# Patient Record
Sex: Female | Born: 1940 | Race: White | Hispanic: No | Smoking: Former smoker
Health system: Southern US, Community
[De-identification: ages and names within clinical notes are randomized; demographics above are authoritative.]

## PROBLEM LIST (undated history)

## (undated) DIAGNOSIS — M839 Adult osteomalacia, unspecified: Secondary | ICD-10-CM

## (undated) DIAGNOSIS — Z66 Do not resuscitate: Secondary | ICD-10-CM

## (undated) DIAGNOSIS — F329 Major depressive disorder, single episode, unspecified: Secondary | ICD-10-CM

## (undated) DIAGNOSIS — R7301 Impaired fasting glucose: Secondary | ICD-10-CM

## (undated) DIAGNOSIS — F32A Depression, unspecified: Secondary | ICD-10-CM

## (undated) DIAGNOSIS — K219 Gastro-esophageal reflux disease without esophagitis: Secondary | ICD-10-CM

## (undated) DIAGNOSIS — C3490 Malignant neoplasm of unspecified part of unspecified bronchus or lung: Secondary | ICD-10-CM

## (undated) DIAGNOSIS — J449 Chronic obstructive pulmonary disease, unspecified: Secondary | ICD-10-CM

## (undated) DIAGNOSIS — C719 Malignant neoplasm of brain, unspecified: Secondary | ICD-10-CM

## (undated) DIAGNOSIS — E7800 Pure hypercholesterolemia, unspecified: Secondary | ICD-10-CM

## (undated) DIAGNOSIS — I10 Essential (primary) hypertension: Secondary | ICD-10-CM

## (undated) DIAGNOSIS — M8580 Other specified disorders of bone density and structure, unspecified site: Secondary | ICD-10-CM

## (undated) DIAGNOSIS — R911 Solitary pulmonary nodule: Secondary | ICD-10-CM

## (undated) DIAGNOSIS — F17200 Nicotine dependence, unspecified, uncomplicated: Secondary | ICD-10-CM

## (undated) HISTORY — DX: Depression, unspecified: F32.A

## (undated) HISTORY — PX: RIGHT OOPHORECTOMY: SHX2359

## (undated) HISTORY — DX: Do not resuscitate: Z66

## (undated) HISTORY — DX: Solitary pulmonary nodule: R91.1

## (undated) HISTORY — DX: Pure hypercholesterolemia, unspecified: E78.00

## (undated) HISTORY — DX: Adult osteomalacia, unspecified: M83.9

## (undated) HISTORY — PX: CHOLECYSTECTOMY: SHX55

## (undated) HISTORY — DX: Nicotine dependence, unspecified, uncomplicated: F17.200

## (undated) HISTORY — PX: APPENDECTOMY: SHX54

## (undated) HISTORY — DX: Impaired fasting glucose: R73.01

## (undated) HISTORY — PX: CATARACT EXTRACTION W/ INTRAOCULAR LENS  IMPLANT, BILATERAL: SHX1307

## (undated) HISTORY — DX: Other specified disorders of bone density and structure, unspecified site: M85.80

## (undated) HISTORY — PX: BILATERAL CARPAL TUNNEL RELEASE: SHX6508

## (undated) HISTORY — DX: Essential (primary) hypertension: I10

## (undated) HISTORY — DX: Malignant neoplasm of brain, unspecified: C71.9

## (undated) HISTORY — DX: Major depressive disorder, single episode, unspecified: F32.9

## (undated) HISTORY — PX: BRAIN SURGERY: SHX531

---

## 1998-08-12 ENCOUNTER — Ambulatory Visit (HOSPITAL_BASED_OUTPATIENT_CLINIC_OR_DEPARTMENT_OTHER): Admission: RE | Admit: 1998-08-12 | Discharge: 1998-08-12 | Payer: Self-pay | Admitting: Orthopedic Surgery

## 1999-07-06 ENCOUNTER — Other Ambulatory Visit: Admission: RE | Admit: 1999-07-06 | Discharge: 1999-07-06 | Payer: Self-pay | Admitting: *Deleted

## 1999-10-08 ENCOUNTER — Encounter: Admission: RE | Admit: 1999-10-08 | Discharge: 1999-10-08 | Payer: Self-pay | Admitting: *Deleted

## 2000-09-12 ENCOUNTER — Other Ambulatory Visit: Admission: RE | Admit: 2000-09-12 | Discharge: 2000-09-12 | Payer: Self-pay | Admitting: *Deleted

## 2002-08-07 ENCOUNTER — Encounter: Admission: RE | Admit: 2002-08-07 | Discharge: 2002-08-07 | Payer: Self-pay | Admitting: *Deleted

## 2002-12-28 ENCOUNTER — Ambulatory Visit (HOSPITAL_COMMUNITY): Admission: RE | Admit: 2002-12-28 | Discharge: 2002-12-28 | Payer: Self-pay | Admitting: Gastroenterology

## 2006-03-04 ENCOUNTER — Other Ambulatory Visit: Admission: RE | Admit: 2006-03-04 | Discharge: 2006-03-04 | Payer: Self-pay | Admitting: Family Medicine

## 2013-03-06 ENCOUNTER — Ambulatory Visit
Admission: RE | Admit: 2013-03-06 | Discharge: 2013-03-06 | Disposition: A | Discharge status: Home / Self Care | Payer: Medicare Other | Source: Ambulatory Visit | Attending: Family Medicine | Admitting: Family Medicine

## 2013-03-06 ENCOUNTER — Other Ambulatory Visit: Payer: Self-pay | Admitting: Family Medicine

## 2013-03-06 DIAGNOSIS — R222 Localized swelling, mass and lump, trunk: Secondary | ICD-10-CM

## 2013-03-06 MED ORDER — IOHEXOL 300 MG/ML  SOLN
75.0000 mL | Freq: Once | INTRAMUSCULAR | Status: AC | PRN
  Administered 2013-03-06: 75 mL via INTRAVENOUS

## 2013-03-09 ENCOUNTER — Other Ambulatory Visit: Payer: Self-pay | Admitting: *Deleted

## 2013-03-09 ENCOUNTER — Institutional Professional Consult (permissible substitution) (INDEPENDENT_AMBULATORY_CARE_PROVIDER_SITE_OTHER): Payer: Medicare Other | Admitting: Cardiothoracic Surgery

## 2013-03-09 ENCOUNTER — Other Ambulatory Visit: Payer: Self-pay

## 2013-03-09 ENCOUNTER — Encounter: Payer: Self-pay | Admitting: *Deleted

## 2013-03-09 VITALS — BP 95/54 | HR 94 | Resp 16 | Ht 61.0 in | Wt 105.0 lb

## 2013-03-09 DIAGNOSIS — R7301 Impaired fasting glucose: Secondary | ICD-10-CM | POA: Insufficient documentation

## 2013-03-09 DIAGNOSIS — I10 Essential (primary) hypertension: Secondary | ICD-10-CM | POA: Insufficient documentation

## 2013-03-09 DIAGNOSIS — C3432 Malignant neoplasm of lower lobe, left bronchus or lung: Secondary | ICD-10-CM | POA: Insufficient documentation

## 2013-03-09 DIAGNOSIS — E7800 Pure hypercholesterolemia, unspecified: Secondary | ICD-10-CM | POA: Insufficient documentation

## 2013-03-09 DIAGNOSIS — R918 Other nonspecific abnormal finding of lung field: Secondary | ICD-10-CM

## 2013-03-09 DIAGNOSIS — M839 Adult osteomalacia, unspecified: Secondary | ICD-10-CM | POA: Insufficient documentation

## 2013-03-09 DIAGNOSIS — R51 Headache: Secondary | ICD-10-CM

## 2013-03-09 DIAGNOSIS — R222 Localized swelling, mass and lump, trunk: Secondary | ICD-10-CM

## 2013-03-09 DIAGNOSIS — M8580 Other specified disorders of bone density and structure, unspecified site: Secondary | ICD-10-CM | POA: Insufficient documentation

## 2013-03-09 NOTE — Progress Notes (Signed)
[address]            [place] [address]          [phone]      Chelsea Cruz [facility] Medical Record #[id number] Date of Birth: 10/30/1940  Referring: Gweneth Dimitri, MD Primary Care: Gweneth Dimitri, MD  Chief Complaint:   Lung Mass  History of Present Illness:    Patient is a 72 year old female with a 40 year history of smoking. Who presented earlier this week to her primary care physician for yearly checkup. While she was there mentioned recent weight loss and shortness of breath with exertion. The chest x-ray was obtained which revealed a large left lung mass. The patient denies hemoptysis, does have some cough, denies wheezing. She has not had any chest pain or bone pain.  Patient was referred to thoracic surgery to evaluate and consider treatment options for a large left lung mass.   Current Activity/ Functional Status:  Patient is independent with mobility/ambulation, transfers, ADL's, IADL's.  Zubrod Score: At the time of surgery this patient's most appropriate activity status/level should be described as: []  Normal activity, no symptoms [x]  Symptoms, fully ambulatory []  Symptoms, in bed less than or equal to 50% of the time []  Symptoms, in bed greater than 50% of the time but less than 100% []  Bedridden []  Moribund   Past Medical History  Diagnosis Date  . Nodule of left lung   . Hypertension   . Nicotine addiction   . Osteopenia   . Osteomalacia   . Impaired fasting glucose   . Hypercholesteremia   . Depression     Past Surgical History  Procedure Laterality Date  . Cholecystectomy    . Right oophorectomy      1969    Family History  Problem Relation Age of Onset  . Heart disease Mother   . Coronary artery disease Brother   . Alzheimer's disease Brother   . Hypertension Brother     #2  . Stroke Brother     #2  . Brain cancer Brother     #3    History   Social History  . Marital Status: Widowed   Spouse Name: N/A    Number of Children: N/A  . Years of Education: N/A   Occupational History  . retired    Social History Main Topics  . Smoking status: Current Every Day Smoker    Types: Cigarettes  . Smokeless tobacco: Not on file  . Alcohol Use: No  . Drug Use: No  . Sexually Active: Not on file   Other Topics Concern  . Not on file   Social History Narrative  . No narrative on file    History  Smoking status  . Current Every Day Smoker  . Types: Cigarettes  Smokeless tobacco  . no    History  Alcohol Use No     Allergies  Allergen Reactions  . Penicillins     Current Outpatient Prescriptions  Medication Sig Dispense Refill  . alendronate (FOSAMAX) 70 MG tablet 70 mg every 7 (seven) days.       Marland Kitchen amLODipine (NORVASC) 2.5 MG tablet 2.5 mg daily.       . cholecalciferol (VITAMIN D) 1000 UNITS tablet Take 1,000 Units by mouth daily. Takes 3 capsules      . COMBIVENT RESPIMAT 20-100 MCG/ACT AERS respimat 1 puff every 6 (  six) hours as needed.       Marland Kitchen lisinopril (PRINIVIL,ZESTRIL) 10 MG tablet 10 mg.       . moxifloxacin (AVELOX) 400 MG tablet 400 mg daily.       . ondansetron (ZOFRAN) 4 MG tablet Take 4 mg by mouth every 8 (eight) hours as needed for nausea.       No current facility-administered medications for this visit.       Review of Systems:     Cardiac Review of Systems: Y or N  Chest Pain [  n  ]  Resting SOB [ n  ] Exertional SOB  [ y ]  Orthopnea [ n ]   Pedal Edema [n   ]    Palpitations [ n ] Syncope  [ n ]   Presyncope [  n ]  General Review of Systems: [Y] = yes [  ]=no Constitional: recent weight change [ y 9 lbs year ]; anorexia [ y ]; fatigue [ y ]; nausea [n  ]; night sweats [ y ]; fever [ n ]; or chills [ n ];                                                                                                                                          Dental: poor dentition[ n ]; Last Dentist visit:   Eye : blurred vision [n  ]; diplopia [  n  ]; vision changes [ n ];  Amaurosis fugax[ n ]; Resp: cough Cove.Etienne  ];  wheezing[n  ];  hemoptysis[n  ]; shortness of breath[ y ]; paroxysmal nocturnal dyspnea[n  ]; dyspnea on exertion[y  ]; or orthopnea[  ];  GI:  gallstones[  ], vomiting[n  ];  dysphagia[  ]; melena[  n];  hematochezia [  ]; heartburn[  ];   Hx of  Colonoscopy[ y ]; GU: kidney stones [  ]; hematuria[  ];   dysuria [  ];  nocturia[  ];  history of     obstruction [n  ]; urinary frequency [  ]             Skin: rash, swelling[  ];, hair loss[  ];  peripheral edema[  ];  or itching[  ]; Musculosketetal: myalgias[  ];  joint swelling[  ];  joint erythema[  ];  joint pain[  ];  back pain[  ];  Heme/Lymph: bruising[  ];  bleeding[  ];  anemia[  ];  Neuro: TIA[  ];  headaches[  ];  stroke[ n ];  vertigo[n  ];  seizures[n  ];   paresthesias[ n ];  difficulty walking[ n ];  Psych:depression[  ]; anxiety[  ];  Endocrine: diabetes[  ];  thyroid dysfunction[  ];  Immunizations: Flu Cove.Etienne  ]; Pneumococcal[ y ];  Other:  Physical Exam: BP 95/54  Pulse 94  Resp 16  Ht  5\' 1"  (1.549 m)  Wt 105 lb (47.628 kg)  BMI 19.85 kg/m2  SpO2 95%  General appearance: alert, cooperative, appears stated age, no distress and pale Neurologic: intact Heart: regular rate and rhythm, S1, S2 normal, no murmur, click, rub or gallop and normal apical impulse Lungs: clear to auscultation bilaterally and normal percussion bilaterally Abdomen: soft, non-tender; bowel sounds normal; no masses,  no organomegaly Extremities: extremities normal, atraumatic, no cyanosis or edema and Homans sign is negative, no sign of DVT Patient has no carotid bruits, no cervical or supraclavicular adenopathy no axillary adenopathy no inguinal adenopathy, just palpable DP and PT pulses bilaterally, she has no clubbing of the nails   Diagnostic Studies & Laboratory data:     Recent Radiology Findings:  Ct Chest W Contrast  03/06/2013   *RADIOLOGY REPORT*  Clinical Data: Abnormal  chest x-ray, smoking history, cough  CT CHEST WITH CONTRAST  Technique:  Multidetector CT imaging of the chest was performed following the standard protocol during bolus administration of intravenous contrast.  Contrast:  75 ml Omnipaque-300  Comparison: Chest x-ray of 03/06/2013  Findings: At the site of the opacity on chest x-ray, there is a large soft tissue mass occupying much of the mid left upper lobe extending anteriorly.  This mass measures 6.9 x 9.4 x 5.9 cm and encircles the bronchus to the left upper lobe, consistent with primary lung carcinoma.  Coarse markings adjacent to this mass posteriorly and inferiorly may represent lymphangitic spread. Diffuse changes of centrilobular emphysema are noted.  No right lung involvement is noted.  No pleural effusion is seen.  On soft tissue window images, the thyroid gland is unremarkable. The thoracic aorta opacifies and the origins of the great vessels are patent.  The pulmonary arteries also are unremarkable.  On the soft tissue window images the mass in the left upper lobe appears to contain multiple areas of necrosis.  There are small mediastinal lymph nodes present none of which appear pathologically enlarged currently.  No evidence of liver metastatic involvement is seen on the few images obtained although there is some prominence of central intrahepatic ducts.  No bony abnormality is noted.  IMPRESSION:  1.  Large soft tissue mass occupies much of the mid and anterior left upper lobe consistent with primary lung carcinoma, encircling the left upper lobe bronchus. 2.  Small mediastinal lymph nodes, none of which are pathologically enlarged. 3.  Diffuse centrilobular emphysema. 4.  Slight prominence of central intrahepatic ducts of questionable significance on the few images through the upper abdomen obtained.   Original Report Authenticated By: Dwyane Dee, M.D.    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 The patient her daughter and granddaughter had their questions answered.   Chelsea Cruz  Bari Clema Skousen MD  Beeper [phone] Office [phone] 03/09/2013 2:09 PM

## 2013-03-09 NOTE — Patient Instructions (Signed)
Lung Cancer  Lung cancer is a tumor which starts as a growth in your lungs. Cancer is a group of many related diseases that begin in cells, the building blocks of the body. Normally, cells grow and divide to produce more cells only when the body needs them. Sometimes cells keep dividing when new cells are not needed. These extra cells may form a mass of tissue called a growth or tumor. Tumors can be either benign (not cancerous) or malignant (cancerous). Cancer can begin in any organ or tissue of the body. The original tumor (where the tumor started out) is called the primary cancer and is usually named for where it begins.   Lung cancer is the most common cause of cancer death in men and women. There are several different types of lung cancers. Usually, lung cancer is described as either small-cell lung cancer or non-small-cell lung cancer. Other types of cancer occur in the lungs, including carcinoid and cancers spread from other organs. The types of cancer have different behavior and treatment.  CAUSES   This cancer usually starts when the lungs are exposed to harmful chemicals. When you quit smoking, your risk of lung cancer falls each year (but is never the same as a person who has never smoked).   Other risks include:    Radon gas exposure.   Asbestos and other industrial substance exposure.   Second hand tobacco smoke.   Air pollution.   Family or personal history of lung cancer.   Age over 65.  SYMPTOMS   Lung cancer can cause many symptoms. They depend on the type of cancer, its location and other factors.  Symptoms of lung cancer can include:   Cough (either new, different or more severe).   Shortness of breath.   Coughing up blood (hemoptysis).   Chest pain.   Hoarseness.   Swelling of the face.   Drooping eyelid.   Changes in blood tests: low sodium (hyponatremia), high calcium (hypercalcemia) or low blood count (anemia).   Weight loss.  In its early stages, lung cancer may not have  symptoms and can be discovered by accident. Many of the symptoms above can be caused by diseases other than lung cancer.  DIAGNOSIS   In early lung cancer, the patient often does not notice problems. It usually has spread by the time problems are first noticed. Your caregiver may suspect lung cancer based on your symptoms, your exam or based on tests (such as x-rays) obtained for other reasons. Common tests that help your caregiver diagnose your condition include:   Chest x-ray.   CT scan of the lungs and chest.   Blood tests.  If a tumor is found, a biopsy will be necessary to confirm that cancer is present and to determine the type of cancer.  TREATMENT    Surgery offers a hope for a cure if the cancer has not spread and the cancer is not a small cell (oat cell) cancer of the lung. Surgery cannot cure the small cell type of cancer.   Radiation Therapy is a form of high energy X-ray that helps slow or kill the cancer. It is often used along with medications (chemotherapy) to help treat the cancer and control pain.   Chemotherapy is used in combination with surgery in advanced cancer. It is also used in all small cell cancers.   Many new treatments look promising.   Your caregiver can give you more information and discuss treatment options that are best for   see a cancer specialist, if that has not been arranged.  If you require oxygen or breathing equipment, be sure you know how to use it and who to call with questions.  Follow any special diet directions. If you have problems with appetite, ask your caregiver for help. SEEK MEDICAL CARE IF:   You have had a surgical procedure are you are having trouble recovering.  You have ongoing weight loss.  You have decreased strength or energy past the  point when your caregiver said you would feel better.  You develop nausea or lightheadedness.  You have pain that is not improving. SEEK IMMEDIATE MEDICAL CARE IF:   You cough up clotted blood or bright red blood.  Your pain is uncontrolled.  You develop new difficulty breathing or chest pain.  You develop swelling in one or both ankles or legs, or swelling in your face or neck.  You develop new headache or confusion. Document Released: 01/10/2001 Document Revised: 12/27/2011 Document Reviewed: 10/21/2008 [name] Patient Information 2014 [place], [place].  Lung Resection A lung resection is surgery to remove a lung. When an entire lung is removed, the procedure is called a pneumonectomy. When only part of a lung is removed, the procedure is called a lobectomy. A lung resection is typically done to get rid of a tumor or cancer. This surgery can help relieve some or all of your symptoms. The surgery can also help keep the problem from getting worse. It may provide the best chance for curing your disease. However, surgery may not necessarily cure lung cancer, if that is the problem. Most people need to stay in the hospital for several days after this procedure.  LET YOUR CAREGIVER KNOW ABOUT:  Allergies to food or medicine.  Medicines taken, including vitamins, herbs, eyedrops, over-the-counter medicines, and creams.  Use of steroids (by mouth or creams).  Previous problems with anesthetics or numbing medicines.  History of bleeding problems or blood clots.  Previous surgery.  Other health problems, including diabetes and kidney problems.  Possibility of pregnancy, if this applies. RISKS AND COMPLICATIONS  Lung resections have been done for many years with good results and few complications. However, all surgery is associated with possible risks. Some of these risks are:  Excessive bleeding.  Infection.  Inability to breath without a ventilator.  Persistent shortness of  breath.  Heart problems, including abnormal rhythms and a risk of heart attack or heart failure.  Blood clots.  Injury to a blood vessel.  Injury to a nerve.  Failure to heal properly.  Stroke.  Bronchopleural fistula. This is a small hole between one of the main breathing tubes and the lining of the lungs. BEFORE THE PROCEDURE  In order to prepare for surgery, your caregiver may ask for several tests to be done. These may include:  Blood tests.  Urine tests.  X-rays.  Imaging tests, such as CT scans, MRI scans, and PET scans. These tests are done to find the exact size and location of the tumor that will be removed.  Pulmonary function tests (PFTs). These are breathing tests to assess the function of your lungs before surgery and to decide how to best help your breathing after surgery.  Heart testing. This is done to make sure your heart is strong enough for the procedure.  Bronchoscopy. This is a technique that allows your caregiver to look at the inside of your airways. This is done using a soft, flexible tube (bronchoscope). Along with imaging tests, this can help  your caregiver know the exact location and size of the area that will be removed during surgery.  Lymph node sampling. This may need to be done to see if the tumor has spread. It may be done as a separate surgery or right before your lung resection procedure. PROCEDURE  An intravenous line (IV) will be placed in your arm. You will be given medicine that makes you sleep (general anesthetic).  Once you are asleep, a breathing tube is placed into your windpipe. You may also get pain medicine through a thin, flexible tube (catheter) in your back. The catheter is put through your skin and next to your spinal cord, where it releases anesthetic medicine.  Next, you will be turned onto your side. This makes it easier for your surgeon to reach the area of your ribcage where the surgical cut (incision) will be made. This  area is washed with a disinfectant solution and might also be shaved. A catheter will be put into your bladder to collect urine. Another tube will be carefully passed through your throat and into your stomach.  The surgeon will make an incision on your side, which will start between two of your ribs and go around to your back. Your ribs will be spread and held open. Part of one rib may be removed to make it easier for the surgeon to reach your lung.  Your surgeon will carefully cut the veins, arteries, and bronchus leading to the lung. After being cut, each of these pieces will be sewn or stapled closed. Then, the lung or part of the lung will be removed.  Your surgeon will check inside your chest to make sure there is no bleeding in or around the lungs. Lymph nodes near the lung may also be removed for later tests. This is done to check if your problems have spread to the lymph nodes.  Depending on your situation, your surgeon may put tubes into your chest to drain extra fluid and air from the chest cavity after surgery. After the tubes are in, your ribcage will be closed with stitches. The stitches help your ribcage heal and keep it from moving. After this, the layers of tissue under the skin are closed with more stitches, which will dissolve inside your body over time. Finally, your skin is closed with stitches or staples and covered with a bandage. AFTER THE PROCEDURE   After surgery, you will be taken to the recovery area where a nurse will monitor your progress. You may still have a breathing tube, spinal catheter, bladder catheter, stomach tube, and possibly chest tubes inside your body. These will be removed during your recovery. You may be put on a respirator following surgery if some assistance is needed to help your breathing. When you are awake, stable, and without complications, you will likely continue recovery in the intensive care unit (ICU).  As you wake up, you might feel some aches  and pains in your chest and throat. Sometimes during recovery, patients may shiver or feel nauseous. Both of these symptoms are temporary and may be caused by the anesthesia. Your caregivers can give you medicine to help these problems go away.  The breathing tube will be taken out as soon as your caregivers feel you can breathe on your own. For most people, this happens on the same day as the surgery.  If your surgery and time in the ICU go well, most of the tubes and equipment will be taken out within the first  1 to 2 days after surgery. This is about how long most people stay in the ICU. You may need to stay longer, depending on how you are doing.  You should also start respiratory therapy in the ICU. This therapy uses breathing exercises to help your other lung stay healthy and get stronger.  As you improve, you will be moved to a regular hospital room for continued respiratory therapy, help with your bladder and bowels, and to continue medicines. Most people stay in the hospital for 5 to 7 days. However, your stay may be longer, depending on how your surgery went and how well you are doing.  After your lung or part of your lung is taken out, there will be a space inside your chest. This space will often fill up with fluid over time. The amount of time this takes is different for each person. Because your chest needs to fill with fluid, your surgeon may or may not put a drainage tube in your chest. If there is a chest tube, it will most likely be removed within 24 hours after the surgery.  You will receive care until you are doing well and your caregiver feels it is safe for you to go home or to transfer to an extended care facility. Document Released: 12/25/2002 Document Revised: 12/27/2011 Document Reviewed: 06/03/2011 [facility] Patient Information 2014 [place], [place]. Positron Emission Tomography (PET Scan) PET stands for positron emission tomography. This is a test similar to an X-ray.  Pictures can be taken of a body part after injection of a very small dose of a chemical called a radionuclide. This is combined with sugar, water, or ammonia to give off tiny particles called positrons. The positrons emitted are like small bursts of energy that can be detected by a scanner. They are processed by a computer to create images. These images can be used to study different diseases. They are often used to study cancer and cancer therapy. A scan of the entire body can be done and used to study all its parts. Because this test is tagged to a sugar used by cells, the bursts of energy show up differently in cells that use sugar faster. The computer is able to produce a color-coded picture based on this. The colors and amount of brightness on a PET image show different levels of tissue or organ function. For example, a cancer grows faster than healthy tissue and uses more sugar than normal tissue. It will absorb more of the substance injected. This causes it to appear brighter than normal tissue on the PET image. A specialist will read and explain the images. Other examinations, such as recent CT (or CAT) scans or MRI scans may help with interpretation and should be brought along. There are usually no restrictions after the test. You should drink plenty of fluids to flush the radioactive substance from your body. BEFORE THE PROCEDURE   PET is usually an outpatient procedure. Wear comfortable, loose-fitting clothes.  Do not eat for four hours before the scan. You will be encouraged to drink water.  Your caregiver will instruct you regarding the use of medications before the test.  Note: Diabetic patients should ask for any specific diet guidelines to control glucose (sugar) levels during the day of the test. There are limitations with the test if your blood sugar is not controlled during or before the test.  Be on time because of the rapid decay of the radioactive material that must be  injected. PROCEDURE  Before  the procedure begins a small amount of harmless radioactive material will be injected into a vein. This means you will have a needle stick. It will take from 30 minutes to one hour for the material to travel around your body in preparation for the scan. You will lie on a cushioned table and be moved through the center of a machine that looks like a large doughnut. This is the machine that detects the positrons. It is connected to a computer that produces images that can be viewed on a monitor. This will take about 30 minutes to an hour, during which you must remain still. Let your caregiver know if this will be difficult for you. Also, let your caregiver know if you need a sedative or help dealing with claustrophobia (feeling uncomfortable in enclosed spaces). HOME CARE INSTRUCTIONS   For the protection of your privacy, test results can not be given over the phone. Make sure you receive the results of your test. Ask as to how these results are to be obtained if you have not been informed. It is your responsibility to obtain your test results.  Drink several 8-once glasses of water following the test to flush the small amount of radioactive material out of your body.  Keep your follow-up appointments. Document Released: 04/10/2003 Document Revised: 12/27/2011 Document Reviewed: 10/04/2005 [facility] Patient Information 2014 [place], [place].  Pulmonary Function Tests Your caregiver has scheduled you for pulmonary function testing. Pulmonary Function Tests (PFTs) are tests which help [place] know how your lungs are working. The lungs are the large organs in your chest on both sides of the heart which allow you to breathe. The main job of the lungs is ventilation. Ventilation is moving air in and out of the lungs. The air breathed in contains oxygen which allows you to live. When you breathe out, your lungs get rid of carbon dioxide, a waste product of breathing. The medical term for  breathing in is inhalation. Breathing out is called exhalation. Some medical conditions interfere with breathing. This may be sudden and short lived as with pneumonia, or may be long standing such as with COPD (chronic obstructive pulmonary disease) that which may come as a result of years of smoking. CONDITIONS THAT INTERFERE WITH NORMAL BREATHING ARE CALLED RESTRICTIVE OR OBSTRUCTIVE  An obstructive condition occurs when air has difficulty flowing into the lungs due to resistance. This causes a decreased flow of air in the lungs. A restrictive condition occurs when the chest muscles are unable to expand adequately. This also causes a decreased flow of air in the lungs. USES OF PULMONARY FUNCTION TESTS Lung (pulmonary) function studies are used to find out causes of lung problems and what will be the best treatment. The "PFT" terms listed below refer to different procedures that measure lung function in different ways. Pulmonary function testing is quick, simple and safe for most people. There are many different reasons why PFTs may be ordered.   For healthy individuals as part of a routine physical examination  In industrial plants to follow how your lungs are working when exposed to chemicals over a long period of time  When an illness involving the lungs is suspected.  PFTs may be used to assess the lung function of patients prior to surgery or other procedures in patients who have current lung and/or heart problems.  The test is also used for people who are smokers or who have other conditions that might be affected by surgery or other procedures. Some common  measurements or values (terms) you may hear your caregiver use are:  Tidal volume (TV) - amount of air breathed in or out during normal breathing.  Minute volume (MV) - total amount of air breathed in and out per minute.  Vital capacity (VC) - total volume of air that can be breathed out after the largest breath in you can  take.  Functional residual capacity (FRC) - amount of air remaining in lungs after normal breathing out.  Total lung capacity - total amount of air in the lungs with the largest breath you can take.  Forced vital capacity (FVC) - the amount of air forced out quickly after taking the largest possible breath.  Forced expiratory volume (FEV) - volume of air breathed out during the first, second, and third seconds of the FVC test.  Forced expiratory flow (FEF) - average rate of flow during the middle half of the FVC test.  Peak expiratory flow rate (PEFR) - maximum amount of air during forced breathing out. The values of these tests vary from person to person. Your values are compared to other people who have had the test and are similar to you in age, size, etc. They can also be compared to previous tests following treatment of lung disease. The tests can determine if lung function is getting better and if the treatments used are successful. COMPLICATIONS OF TESTING MAY INCLUDE:  Light-headedness due to over breathing (hyperventilation).  An asthmatic attack from deep breathing. SOME REASONS FOR NOT DOING THE TEST INCLUDE:  Recent eye surgery, because of increased pressure inside the eyes during the procedure.  Recent abdominal or chest surgery, because of inability to take deep breaths.  Chest pain or heart problems.  Tuberculosis or respiratory infections, such as pneumonia, a cold, or the flu. Discuss concerns with your caregiver before your procedure. PREPARATION FOR TEST   Avoid eating a large meal before your test.  Do not smoke before your test.  Take medications as ordered by your caregiver.  Wear comfortable clothing which will not interfere with breathing. If done as an outpatient, you should be present 60 minutes prior to your procedure or as directed.  DURING THE TEST  You will be given a soft nose clip to wear during the procedure so that all of your breaths will go  through your mouth instead of your nose.  You will be given a sterile (germ-free) mouthpiece that will be attached to the spirometer. The spirometer is the machine that measures your breathing.  You will be instructed to perform various breathing maneuvers. The maneuvers will be done by inhaling (breathing in) and exhaling (breathing out). Depending on what measurements are ordered, you may be asked to repeat the maneuvers several times before the test is completed.  You may be given a bronchodilator after testing has been performed. A bronchodilator is a medication which makes the small air passages in your lungs larger. These medications usually make it easier to breathe. The tests are then repeated several minutes later after the bronchodilator has taken effect.  You will be monitored carefully during the procedure for faintness, dizziness, difficulty breathing, or any other problems. AFTER THE PROCEDURE   You may resume your usual diet, medications, and activities unless your physician advises you otherwise.  Your caregiver will go over your test results with you and determine what is causing your lung problems and what treatments may be helpful. Document Released: 05/27/2004 Document Revised: 12/27/2011 Document Reviewed: 10/02/2008 [employer] Patient Information 2014 [place].

## 2013-03-16 ENCOUNTER — Ambulatory Visit (HOSPITAL_COMMUNITY)
Admission: RE | Admit: 2013-03-16 | Discharge: 2013-03-16 | Disposition: A | Discharge status: Home / Self Care | Payer: Medicare Other | Source: Ambulatory Visit | Attending: Cardiothoracic Surgery | Admitting: Cardiothoracic Surgery

## 2013-03-16 ENCOUNTER — Encounter (HOSPITAL_COMMUNITY)
Admission: RE | Admit: 2013-03-16 | Discharge: 2013-03-16 | Disposition: A | Discharge status: Home / Self Care | Payer: Medicare Other | Source: Ambulatory Visit | Attending: Cardiothoracic Surgery | Admitting: Cardiothoracic Surgery

## 2013-03-16 DIAGNOSIS — G319 Degenerative disease of nervous system, unspecified: Secondary | ICD-10-CM | POA: Insufficient documentation

## 2013-03-16 DIAGNOSIS — R05 Cough: Secondary | ICD-10-CM | POA: Insufficient documentation

## 2013-03-16 DIAGNOSIS — R918 Other nonspecific abnormal finding of lung field: Secondary | ICD-10-CM

## 2013-03-16 DIAGNOSIS — R51 Headache: Secondary | ICD-10-CM

## 2013-03-16 DIAGNOSIS — J988 Other specified respiratory disorders: Secondary | ICD-10-CM | POA: Insufficient documentation

## 2013-03-16 DIAGNOSIS — R222 Localized swelling, mass and lump, trunk: Secondary | ICD-10-CM | POA: Insufficient documentation

## 2013-03-16 DIAGNOSIS — R0609 Other forms of dyspnea: Secondary | ICD-10-CM | POA: Insufficient documentation

## 2013-03-16 DIAGNOSIS — R059 Cough, unspecified: Secondary | ICD-10-CM | POA: Insufficient documentation

## 2013-03-16 DIAGNOSIS — R0989 Other specified symptoms and signs involving the circulatory and respiratory systems: Secondary | ICD-10-CM | POA: Insufficient documentation

## 2013-03-16 LAB — PULMONARY FUNCTION TEST

## 2013-03-16 LAB — CREATININE, SERUM
Creatinine, Ser: 0.79 mg/dL (ref 0.50–1.10)
GFR calc Af Amer: >90 mL/min (ref >90)
GFR calc non Af Amer: 81 mL/min — ABNORMAL LOW (ref >90)

## 2013-03-16 LAB — GLUCOSE, CAPILLARY: Glucose-Capillary: 112 mg/dL — ABNORMAL HIGH (ref 70–99)

## 2013-03-16 MED ORDER — GADOBENATE DIMEGLUMINE 529 MG/ML IV SOLN
10.0000 mL | Freq: Once | INTRAVENOUS | Status: AC | PRN
  Administered 2013-03-16: 10 mL via INTRAVENOUS

## 2013-03-16 MED ORDER — FLUDEOXYGLUCOSE F - 18 (FDG) INJECTION
18.4000 | Freq: Once | INTRAVENOUS | Status: AC | PRN
  Administered 2013-03-16: 18.4 via INTRAVENOUS

## 2013-03-16 MED ORDER — ALBUTEROL SULFATE (5 MG/ML) 0.5% IN NEBU
2.5000 mg | INHALATION_SOLUTION | Freq: Once | RESPIRATORY_TRACT | Status: AC
  Administered 2013-03-16: 2.5 mg via RESPIRATORY_TRACT

## 2013-03-22 ENCOUNTER — Ambulatory Visit (INDEPENDENT_AMBULATORY_CARE_PROVIDER_SITE_OTHER): Payer: Medicare Other | Admitting: Cardiothoracic Surgery

## 2013-03-22 ENCOUNTER — Encounter: Payer: Self-pay | Admitting: Cardiothoracic Surgery

## 2013-03-22 ENCOUNTER — Other Ambulatory Visit: Payer: Self-pay

## 2013-03-22 VITALS — BP 107/81 | Ht 61.0 in | Wt 104.0 lb

## 2013-03-22 DIAGNOSIS — R222 Localized swelling, mass and lump, trunk: Secondary | ICD-10-CM

## 2013-03-22 DIAGNOSIS — R918 Other nonspecific abnormal finding of lung field: Secondary | ICD-10-CM

## 2013-03-22 DIAGNOSIS — D381 Neoplasm of uncertain behavior of trachea, bronchus and lung: Secondary | ICD-10-CM

## 2013-03-22 NOTE — Progress Notes (Signed)
[address]       [place] [address]             [phone]                      Chelsea Cruz [facility] Medical Record #[id number] Date of Birth: Nov 25, 1940  Referring: Gweneth Dimitri, MD Primary Care: Gweneth Dimitri, MD  Chief Complaint:   Lung Mass  History of Present Illness:    Patient is a 72 year old female with a 40 year history of smoking. Who presented two  Week ago  to her primary care physician for yearly checkup. While she was there mentioned recent weight loss and shortness of breath with exertion. The chest x-ray was obtained which revealed a large left lung mass. The patient denies hemoptysis, does have some cough, denies wheezing. She has not had any chest pain or bone pain.  Patient was referred to thoracic surgery to evaluate and consider treatment options for a large left lung mass.  She returns today after PFTs done and PET scan and MRI of the brain have been completed   Current Activity/ Functional Status:  Patient is independent with mobility/ambulation, transfers, ADL's, IADL's.  Zubrod Score: At the time of surgery this patient's most appropriate activity status/level should be described as: []  Normal activity, no symptoms [x]  Symptoms, fully ambulatory []  Symptoms, in bed less than or equal to 50% of the time []  Symptoms, in bed greater than 50% of the time but less than 100% []  Bedridden []  Moribund   Past Medical History  Diagnosis Date  . Nodule of left lung   . Hypertension   . Nicotine addiction   . Osteopenia   . Osteomalacia   . Impaired fasting glucose   . Hypercholesteremia   . Depression     Past Surgical History  Procedure Laterality Date  . Cholecystectomy    . Right oophorectomy      1969    Family History  Problem Relation Age of Onset  . Heart disease Mother   . Coronary artery disease Brother   . Alzheimer's disease Brother   . Hypertension Brother     #2  . Stroke Brother     #2  .  Brain cancer Brother     #3    History   Social History  . Marital Status: Widowed    Spouse Name: N/A    Number of Children: N/A  . Years of Education: N/A   Occupational History  . retired    Social History Main Topics  . Smoking status: Current Every Day Smoker    Types: Cigarettes  . Smokeless tobacco: Not on file  . Alcohol Use: No  . Drug Use: No  . Sexually Active: Not on file   Other Topics Concern  . Not on file   Social History Narrative  . No narrative on file    History  Smoking status  . Current Every Day Smoker  . Types: Cigarettes  Smokeless tobacco  . no    History  Alcohol Use No     Allergies  Allergen Reactions  . Penicillins     Current Outpatient Prescriptions  Medication Sig Dispense Refill  . alendronate (FOSAMAX) 70 MG tablet 70 mg every 7 (seven) days.       . cholecalciferol (VITAMIN D) 1000 UNITS tablet Take 1,000 Units by mouth daily. Takes 3 capsules      . lisinopril (PRINIVIL,ZESTRIL)  10 MG tablet 10 mg.       . amLODipine (NORVASC) 2.5 MG tablet 2.5 mg daily.       . COMBIVENT RESPIMAT 20-100 MCG/ACT AERS respimat 1 puff every 6 (six) hours as needed.        No current facility-administered medications for this visit.       Review of Systems:     Cardiac Review of Systems: Y or N  Chest Pain [  n  ]  Resting SOB [ n  ] Exertional SOB  [ y ]  Orthopnea [ n ]   Pedal Edema [n   ]    Palpitations [ n ] Syncope  [ n ]   Presyncope [  n ]  General Review of Systems: [Y] = yes [  ]=no Constitional: recent weight change [ y 9 lbs year ]; anorexia [ y ]; fatigue [ y ]; nausea [n  ]; night sweats [ y ]; fever [ n ]; or chills [ n ];                                                                                                                                          Dental: poor dentition[ n ]; Last Dentist visit:   Eye : blurred vision [n  ]; diplopia [  n ]; vision changes [ n ];  Amaurosis fugax[ n ]; Resp: cough Cove.Etienne  ];   wheezing[n  ];  hemoptysis[n  ]; shortness of breath[ y ]; paroxysmal nocturnal dyspnea[n  ]; dyspnea on exertion[y  ]; or orthopnea[  ];  GI:  gallstones[  ], vomiting[n  ];  dysphagia[  ]; melena[  n];  hematochezia [  ]; heartburn[  ];   Hx of  Colonoscopy[ y ]; GU: kidney stones [  ]; hematuria[  ];   dysuria [  ];  nocturia[  ];  history of     obstruction [n  ]; urinary frequency [  ]             Skin: rash, swelling[  ];, hair loss[  ];  peripheral edema[  ];  or itching[  ]; Musculosketetal: myalgias[  ];  joint swelling[  ];  joint erythema[  ];  joint pain[  ];  back pain[  ];  Heme/Lymph: bruising[  ];  bleeding[  ];  anemia[  ];  Neuro: TIA[  ];  headaches[  ];  stroke[ n ];  vertigo[n  ];  seizures[n  ];   paresthesias[ n ];  difficulty walking[ n ];  Psych:depression[  ]; anxiety[  ];  Endocrine: diabetes[  ];  thyroid dysfunction[  ];  Immunizations: Flu Cove.Etienne  ]; Pneumococcal[ y ];  Other:  Physical Exam: BP 107/81  Ht 5\' 1"  (1.549 m)  Wt 104 lb (47.174 kg)  BMI 19.66 kg/m2  SpO2 96%  General appearance: alert, cooperative,  appears stated age, no distress and pale Neurologic: intact Heart: regular rate and rhythm, S1, S2 normal, no murmur, click, rub or gallop and normal apical impulse Lungs: clear to auscultation bilaterally and normal percussion bilaterally Abdomen: soft, non-tender; bowel sounds normal; no masses,  no organomegaly Extremities: extremities normal, atraumatic, no cyanosis or edema and Homans sign is negative, no sign of DVT Patient has no carotid bruits, no cervical or supraclavicular adenopathy no axillary adenopathy no inguinal adenopathy, just palpable DP and PT pulses bilaterally, she has no clubbing of the nails   Diagnostic Studies & Laboratory data:     Recent Radiology Findings:   Mr Lodema Pilot Contrast  03/16/2013   *RADIOLOGY REPORT*  Clinical Data: Lung cancer.  Headaches.  MRI HEAD WITHOUT AND WITH CONTRAST  Technique:  Multiplanar,  multiecho pulse sequences of the brain and surrounding structures were obtained according to standard protocol without and with intravenous contrast  Contrast: 10mL MULTIHANCE GADOBENATE DIMEGLUMINE 529 MG/ML IV SOLN  Comparison: None.  Findings: No acute infarct, hemorrhage, mass lesion is present.  The postcontrast images demonstrate no pathologic enhancement to suggest metastatic disease of the brain or meninges.  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  Mild generalized atrophy is within normal limits for age.  Periventricular subcortical T2 hyperintensities are slightly greater than expected for age.  There is no associated enhancement.  Flow is present in the major intracranial arteries.  The patient is status post bilateral lens extractions.  The paranasal sinuses are clear.  There is some fluid in the left mastoid air cells.  No obstructing nasopharyngeal lesion is evident.  IMPRESSION:  1.  No evidence for metastatic disease of the brain or meninges. 2.  Mild atrophy white matter disease.  This likely reflects the sequelae of chronic microvascular ischemia. 3.  Left mastoid effusion.  No obstructing nasopharyngeal lesion or enhancement is evident.   Original Report Authenticated By: Marin Roberts, M.D.   Nm Pet Image Initial (pi) Skull Base To Thigh  03/16/2013   *RADIOLOGY REPORT*  Clinical Data: Initial treatment strategy for left lung mass. History of smoking and weight loss.  NUCLEAR MEDICINE PET SKULL BASE TO THIGH  Fasting Blood Glucose:  110  Technique:  18.4 mCi F-18 FDG was injected intravenously. CT data was obtained and used for attenuation correction and anatomic localization only.  (This was not acquired as a diagnostic CT examination.) Additional exam technical data entered on technologist worksheet.  Comparison:  Chest CT 03/06/2013.  Findings:  Neck: No hypermetabolic lymph nodes in the neck.  There is  physiologic activity associate with the vocal cords.  Chest:  The large left lung mass is markedly hypermetabolic.  The hypermetabolic activity is primarily peripheral consistent with central necrosis.  SUV max is 30.2.  This mass measures 9.3 x 7.1 cm transverse and has an epicenter in the upper lobe, although crosses the major fissure.  Apart from this dominant mass, there is no hypermetabolic pulmonary or mediastinal nodal activity.  Abdomen/Pelvis:  No abnormal hypermetabolic activity within the liver, pancreas, adrenal glands, or spleen.  Both adrenal glands demonstrate mild low density prominence without abnormal metabolic activity.  No hypermetabolic lymph nodes in the abdomen or pelvis. Minimal biliary prominence appears unchanged.  Skeleton:  No focal hypermetabolic activity to suggest skeletal metastasis.  IMPRESSION:  1.  The large left lung mass is markedly hypermetabolic consistent with bronchogenic carcinoma.  This involves the  upper and lower lobes and occludes the upper lobe bronchus. 2.  No mediastinal invasion or distant metastases demonstrated.   Original Report Authenticated By: Carey Bullocks, M.D.   Ct Chest W Contrast  03/06/2013   *RADIOLOGY REPORT*  Clinical Data: Abnormal chest x-ray, smoking history, cough  CT CHEST WITH CONTRAST  Technique:  Multidetector CT imaging of the chest was performed following the standard protocol during bolus administration of intravenous contrast.  Contrast:  75 ml Omnipaque-300  Comparison: Chest x-ray of 03/06/2013  Findings: At the site of the opacity on chest x-ray, there is a large soft tissue mass occupying much of the mid left upper lobe extending anteriorly.  This mass measures 6.9 x 9.4 x 5.9 cm and encircles the bronchus to the left upper lobe, consistent with primary lung carcinoma.  Coarse markings adjacent to this mass posteriorly and inferiorly may represent lymphangitic spread. Diffuse changes of centrilobular emphysema are noted.  No right  lung involvement is noted.  No pleural effusion is seen.  On soft tissue window images, the thyroid gland is unremarkable. The thoracic aorta opacifies and the origins of the great vessels are patent.  The pulmonary arteries also are unremarkable.  On the soft tissue window images the mass in the left upper lobe appears to contain multiple areas of necrosis.  There are small mediastinal lymph nodes present none of which appear pathologically enlarged currently.  No evidence of liver metastatic involvement is seen on the few images obtained although there is some prominence of central intrahepatic ducts.  No bony abnormality is noted.  IMPRESSION:  1.  Large soft tissue mass occupies much of the mid and anterior left upper lobe consistent with primary lung carcinoma, encircling the left upper lobe bronchus. 2.  Small mediastinal lymph nodes, none of which are pathologically enlarged. 3.  Diffuse centrilobular emphysema. 4.  Slight prominence of central intrahepatic ducts of questionable significance on the few images through the upper abdomen obtained.   Original Report Authenticated By: Dwyane Dee, M.D.    Recent Lab Findings: No results found for this basename: WBC,  HGB,  HCT,  PLT,  GLUCOSE,  CHOL,  TRIG,  HDL,  LDLDIRECT,  LDLCALC,  ALT,  AST,  NA,  K,  CL,  CREATININE,  BUN,  CO2,  TSH,  INR,  GLUF,  HGBA1C   FEV1   .98  52%  DLCO   8.17 40 %   Assessment / Plan:    Large Left lung mass highly  suspicious  for primary carcinoma of the lung, at least clinical stage cT3,cN0,cM0 Stage II B assuming no nodal involvement I have reviewed with the patient the CT and PET scan and prob dx of carcinoma of the lung MRI shows not Brain mets PFT'S show marginal reserve especially with  need for Pneumonectomy  Because of the patient's limited pulmonary reserve both on PFTs and significant shortness of breath just climbing stairs I do not feel she would tolerate a pneumonectomy. I reviewed this with her and her  daughte and recommended proceeding with needle biopsy to obtain a tissue diagnosis and enough tissue for markers if the tumor is adenocarcinoma. Will arrange for a needle biopsy and followup appointment to [facility] to see medical oncology and radiation.   Delight Ovens MD  Beeper [phone] Office [phone] 03/22/2013 5:38 PM

## 2013-03-23 ENCOUNTER — Other Ambulatory Visit: Payer: Self-pay | Admitting: Radiology

## 2013-03-26 ENCOUNTER — Ambulatory Visit (HOSPITAL_COMMUNITY)
Admission: RE | Admit: 2013-03-26 | Discharge: 2013-03-26 | Disposition: A | Discharge status: Home / Self Care | Payer: Medicare Other | Source: Ambulatory Visit | Attending: Cardiothoracic Surgery | Admitting: Cardiothoracic Surgery

## 2013-03-26 ENCOUNTER — Ambulatory Visit (HOSPITAL_COMMUNITY)
Admission: RE | Admit: 2013-03-26 | Discharge: 2013-03-26 | Disposition: A | Discharge status: Home / Self Care | Payer: Medicare Other | Source: Ambulatory Visit | Attending: Diagnostic Radiology | Admitting: Diagnostic Radiology

## 2013-03-26 ENCOUNTER — Telehealth: Payer: Self-pay | Admitting: *Deleted

## 2013-03-26 ENCOUNTER — Encounter (HOSPITAL_COMMUNITY): Payer: Self-pay

## 2013-03-26 VITALS — BP 116/57 | HR 76 | Temp 98.4°F | Resp 16 | Ht 61.0 in | Wt 104.0 lb

## 2013-03-26 DIAGNOSIS — F17200 Nicotine dependence, unspecified, uncomplicated: Secondary | ICD-10-CM | POA: Insufficient documentation

## 2013-03-26 DIAGNOSIS — F329 Major depressive disorder, single episode, unspecified: Secondary | ICD-10-CM | POA: Insufficient documentation

## 2013-03-26 DIAGNOSIS — C3490 Malignant neoplasm of unspecified part of unspecified bronchus or lung: Secondary | ICD-10-CM | POA: Insufficient documentation

## 2013-03-26 DIAGNOSIS — D381 Neoplasm of uncertain behavior of trachea, bronchus and lung: Secondary | ICD-10-CM

## 2013-03-26 DIAGNOSIS — E785 Hyperlipidemia, unspecified: Secondary | ICD-10-CM | POA: Insufficient documentation

## 2013-03-26 DIAGNOSIS — M899 Disorder of bone, unspecified: Secondary | ICD-10-CM | POA: Insufficient documentation

## 2013-03-26 DIAGNOSIS — I10 Essential (primary) hypertension: Secondary | ICD-10-CM | POA: Insufficient documentation

## 2013-03-26 DIAGNOSIS — F3289 Other specified depressive episodes: Secondary | ICD-10-CM | POA: Insufficient documentation

## 2013-03-26 LAB — CBC
HCT: 36.5 % (ref 36.0–46.0)
Hemoglobin: 11.4 g/dL — ABNORMAL LOW (ref 12.0–15.0)
RDW: 14.4 % (ref 11.5–15.5)
WBC: 13.2 10*3/uL — ABNORMAL HIGH (ref 4.0–10.5)

## 2013-03-26 LAB — APTT: aPTT: 26 seconds (ref 24–37)

## 2013-03-26 LAB — PROTIME-INR
INR: 1.05 (ref 0.00–1.49)
Prothrombin Time: 13.6 seconds (ref 11.6–15.2)

## 2013-03-26 MED ORDER — MIDAZOLAM HCL 2 MG/2ML IJ SOLN
INTRAMUSCULAR | Status: AC | PRN
  Administered 2013-03-26: 1 mg via INTRAVENOUS
  Administered 2013-03-26: 0.5 mg via INTRAVENOUS

## 2013-03-26 MED ORDER — HYDROCODONE-ACETAMINOPHEN 5-325 MG PO TABS
1.0000 | ORAL_TABLET | ORAL | Status: DC | PRN
  Filled 2013-03-26: qty 2

## 2013-03-26 MED ORDER — SODIUM CHLORIDE 0.9 % IV SOLN
Freq: Once | INTRAVENOUS | Status: AC
  Administered 2013-03-26: 09:00:00 via INTRAVENOUS

## 2013-03-26 MED ORDER — FENTANYL CITRATE 0.05 MG/ML IJ SOLN
INTRAMUSCULAR | Status: AC | PRN
  Administered 2013-03-26 (×2): 12.5 ug via INTRAVENOUS
  Administered 2013-03-26: 25 ug via INTRAVENOUS

## 2013-03-26 MED ORDER — FENTANYL CITRATE 0.05 MG/ML IJ SOLN
INTRAMUSCULAR | Status: AC
  Filled 2013-03-26: qty 4

## 2013-03-26 MED ORDER — MIDAZOLAM HCL 2 MG/2ML IJ SOLN
INTRAMUSCULAR | Status: AC
  Filled 2013-03-26: qty 4

## 2013-03-26 NOTE — H&P (Signed)
Chelsea Cruz is an 72 y.o. female.   Chief Complaint: Recent shortness of breath; wt loss CXR reveals Left lung mass CT confirms same PET + lung mass Scheduled for L Lung mass biopsy HPI: HTN; quit smoking x 1 mo; osteopenia; HLD  Past Medical History  Diagnosis Date  . Nodule of left lung   . Hypertension   . Nicotine addiction   . Osteopenia   . Osteomalacia   . Impaired fasting glucose   . Hypercholesteremia   . Depression     Past Surgical History  Procedure Laterality Date  . Cholecystectomy    . Right oophorectomy      1969    Family History  Problem Relation Age of Onset  . Heart disease Mother   . Coronary artery disease Brother   . Alzheimer's disease Brother   . Hypertension Brother     #2  . Stroke Brother     #2  . Brain cancer Brother     #3   Social History:  reports that she has been smoking Cigarettes.  She has been smoking about 0.00 packs per day. She does not have any smokeless tobacco history on file. She reports that she does not drink alcohol or use illicit drugs.  Allergies:  Allergies  Allergen Reactions  . Penicillins      (Not in a hospital admission)  No results found for this or any previous visit (from the past 48 hour(s)). No results found.  Review of Systems  Constitutional: Positive for weight loss. Negative for fever.  Respiratory: Positive for cough and shortness of breath.   Cardiovascular: Negative for chest pain.  Gastrointestinal: Negative for nausea, vomiting and abdominal pain.  Musculoskeletal: Negative for back pain.  Neurological: Negative for dizziness, weakness and headaches.    Blood pressure 134/61, pulse 101, temperature 97.6 F (36.4 C), temperature source Oral, resp. rate 18, height 5\' 1"  (1.549 m), weight 104 lb (47.174 kg), SpO2 98.00%. Physical Exam  Constitutional: She is oriented to person, place, and time.  Cardiovascular: Normal rate, regular rhythm and normal heart sounds.   No murmur  heard. Respiratory: Effort normal and breath sounds normal. She has no wheezes.  GI: Soft. Bowel sounds are normal. There is no tenderness.  Musculoskeletal: Normal range of motion.  Neurological: She is alert and oriented to person, place, and time.  Psychiatric: She has a normal mood and affect. Her behavior is normal. Judgment and thought content normal.     Assessment/Plan Left lung mass Scheduled for bx today Pt aware of procedure benefits and risks and agreeable to proceed Consent signed and in chart  Amarisa Wilinski A 03/26/2013, 8:12 AM

## 2013-03-26 NOTE — Telephone Encounter (Signed)
Left vm message to call regarding appt. Left my name and phone number

## 2013-03-26 NOTE — Procedures (Signed)
CT guided biopsy of left lung mass.  4 cores obtained.  No immediate complication.

## 2013-03-26 NOTE — ED Notes (Signed)
Short stay notified for bed 

## 2013-03-27 ENCOUNTER — Telehealth: Payer: Self-pay | Admitting: Internal Medicine

## 2013-03-27 NOTE — Telephone Encounter (Signed)
C/D 03/27/13 for appt. 03/29/13 °

## 2013-03-29 ENCOUNTER — Telehealth: Payer: Self-pay | Admitting: Internal Medicine

## 2013-03-29 ENCOUNTER — Encounter: Payer: Self-pay | Admitting: Radiation Oncology

## 2013-03-29 ENCOUNTER — Encounter: Payer: Self-pay | Admitting: *Deleted

## 2013-03-29 ENCOUNTER — Ambulatory Visit (HOSPITAL_BASED_OUTPATIENT_CLINIC_OR_DEPARTMENT_OTHER): Payer: Medicare Other | Admitting: Internal Medicine

## 2013-03-29 ENCOUNTER — Ambulatory Visit
Admission: RE | Admit: 2013-03-29 | Discharge: 2013-03-29 | Disposition: A | Discharge status: Home / Self Care | Payer: Medicare Other | Source: Ambulatory Visit | Attending: Radiation Oncology | Admitting: Radiation Oncology

## 2013-03-29 VITALS — BP 122/71 | HR 117 | Temp 98.5°F | Resp 18 | Ht 61.0 in | Wt 108.0 lb

## 2013-03-29 DIAGNOSIS — C3410 Malignant neoplasm of upper lobe, unspecified bronchus or lung: Secondary | ICD-10-CM

## 2013-03-29 DIAGNOSIS — C3492 Malignant neoplasm of unspecified part of left bronchus or lung: Secondary | ICD-10-CM

## 2013-03-29 DIAGNOSIS — D649 Anemia, unspecified: Secondary | ICD-10-CM

## 2013-03-29 NOTE — Progress Notes (Addendum)
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    HISTORY OF PRESENT ILLNESS::Chelsea Cruz is a 72 y.o. female who presented two Week ago to her primary care physician for yearly checkup. While she was there mentioned recent weight loss and shortness of breath with exertion. The chest x-ray was obtained which revealed a large left lung mass. Chest CT confirmed a mass involving the left upper and lower lobes encircling the left sided airways.  PET CT showed SUV of 30 with no adenopathy.  MRI show know metastases.  CT biopsy on 6/9 shows squamous cell carcinoma.  She presents to multidisciplinary conference and clinic today.  PREVIOUS RADIATION THERAPY: No  PAST MEDICAL HISTORY:  has a past medical history of Nodule of left lung; Hypertension; Nicotine addiction; Osteopenia; Osteomalacia; Impaired fasting glucose; Hypercholesteremia; and Depression.    PAST SURGICAL HISTORY: Past Surgical History  Procedure Laterality Date  . Cholecystectomy    . Right oophorectomy      1969    FAMILY HISTORY: family history includes Alzheimer's disease in her brother; Brain cancer in her brother; Coronary artery disease in her brother; Heart disease in her mother; Hypertension in her brother; and Stroke in her brother.  SOCIAL HISTORY:  reports that she has been smoking Cigarettes.  She has been smoking about 0.00 packs per day. She does not have any smokeless tobacco history on file. She reports that she does not drink alcohol or use illicit drugs.  ALLERGIES: Penicillins  MEDICATIONS:  Current Outpatient Prescriptions    Medication Sig Dispense Refill  . alendronate (FOSAMAX) 70 MG tablet Take 70 mg by mouth every 7 (seven) days.       Marland Kitchen amLODipine (NORVASC) 2.5 MG tablet 2.5 mg daily.       . cholecalciferol (VITAMIN D) 1000 UNITS tablet Take 3,000 Units by mouth daily. Takes 3 capsules      . COMBIVENT RESPIMAT 20-100 MCG/ACT AERS respimat 1 puff every 6 (six) hours as needed for wheezing.       Marland Kitchen lisinopril (PRINIVIL,ZESTRIL) 10 MG tablet Take 10 mg by mouth daily.        No current facility-administered medications for this encounter.    REVIEW OF SYSTEMS:  A 15 point review of systems is documented in the electronic medical record. This was obtained by the nursing staff. However, I reviewed this with the patient to discuss relevant findings and make appropriate changes.  Pertinent items are noted in HPI.   PHYSICAL EXAM:  vitals were not taken for this visit.  Per thoracic surgery General appearance: alert, cooperative, appears stated age, no distress and pale Neurologic: intact Heart: regular rate and rhythm, S1, S2 normal, no murmur, click, rub or gallop and normal apical impulse Lungs: clear to auscultation bilaterally and normal percussion bilaterally  Abdomen: soft, non-tender; bowel sounds normal; no masses, no organomegaly Extremities: extremities normal, atraumatic, no cyanosis or edema and Homans sign is negative, no sign of DVT  Patient has no carotid bruits, no cervical or supraclavicular adenopathy no axillary adenopathy no inguinal adenopathy, just palpable DP and PT pulses bilaterally, she has no clubbing of  the nails  KPS =  90   - Able to carry on normal activity; minor signs or symptoms of disease.  Karnofsky DA, Abelmann WH, Craver LS and Burchenal [facility] [phone]) The use of the nitrogen mustards in the palliative treatment of carcinoma: with particular reference to bronchogenic carcinoma Cancer 1 [id number]   LABORATORY DATA:  Lab Results  Component Value Date   WBC 13.2* 03/26/2013   HGB  11.4* 03/26/2013   HCT 36.5 03/26/2013   MCV 79.3 03/26/2013   PLT 587* 03/26/2013   PFTs 03/16/13    RADIOGRAPHY: Dg Chest 1 View  03/26/2013   *RADIOLOGY REPORT*  Clinical Data: Left lung biopsy.  CHEST - 1 VIEW  Comparison: Chest x-ray 03/06/2013 and CT 03/06/2013.  CT biopsy earlier today.  Findings: The mass appears unchanged.  There is no pneumothorax. Slight left effusion  IMPRESSION: No pneumothorax post biopsy.   Original Report Authenticated By: Davonna Belling, M.D.   Ct Chest W Contrast  03/06/2013   *RADIOLOGY REPORT*  Clinical Data: Abnormal chest x-ray, smoking history, cough  CT CHEST WITH CONTRAST  Technique:  Multidetector CT imaging of the chest was performed following the standard protocol during bolus administration of intravenous contrast.  Contrast:  75 ml Omnipaque-300  Comparison: Chest x-ray of 03/06/2013  Findings: At the site of the opacity on chest x-ray, there is a large soft tissue mass occupying much of the mid left upper lobe extending anteriorly.  This mass measures 6.9 x 9.4 x 5.9 cm and encircles the bronchus to the left upper lobe, consistent with primary lung carcinoma.  Coarse markings adjacent to this mass posteriorly and inferiorly may represent lymphangitic spread. Diffuse changes of centrilobular emphysema are noted.  No right lung involvement is noted.  No pleural effusion is seen.  On soft tissue window images, the thyroid gland is unremarkable. The thoracic aorta opacifies and the origins of the great vessels are patent.  The pulmonary arteries also are unremarkable.  On the soft tissue window images the mass in the left upper lobe appears to contain multiple areas of necrosis.  There are small mediastinal lymph nodes present none of which appear pathologically enlarged currently.  No evidence of liver metastatic involvement is seen on the few images obtained although there is some prominence of central intrahepatic ducts.  No bony abnormality is noted.  IMPRESSION:  1.   Large soft tissue mass occupies much of the mid and anterior left upper lobe consistent with primary lung carcinoma, encircling the left upper lobe bronchus. 2.  Small mediastinal lymph nodes, none of which are pathologically enlarged. 3.  Diffuse centrilobular emphysema. 4.  Slight prominence of central intrahepatic ducts of questionable significance on the few images through the upper abdomen obtained.   Original Report Authenticated By: Dwyane Dee, M.D.   Mr [name] RU Contrast  03/16/2013   *RADIOLOGY REPORT*  Clinical Data: Lung cancer.  Headaches.  MRI HEAD WITHOUT AND WITH CONTRAST  Technique:  Multiplanar, multiecho pulse sequences of the brain and surrounding structures were obtained according to standard protocol without and with intravenous contrast  Contrast: 10mL MULTIHANCE GADOBENATE DIMEGLUMINE 529 MG/ML IV SOLN  Comparison: None.  Findings: No acute infarct, hemorrhage, mass lesion is present.  The postcontrast images demonstrate no pathologic enhancement to suggest metastatic disease of the brain or meninges.  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  Mild generalized atrophy is within normal limits for  age.  Periventricular subcortical T2 hyperintensities are slightly greater than expected for age.  There is no associated enhancement.  Flow is present in the major intracranial arteries.  The patient is status post bilateral lens extractions.  The paranasal sinuses are clear.  There is some fluid in the left mastoid air cells.  No obstructing nasopharyngeal lesion is evident.  IMPRESSION:  1.  No evidence for metastatic disease of the brain or meninges. 2.  Mild atrophy white matter disease.  This likely reflects the sequelae of chronic microvascular ischemia. 3.  Left mastoid effusion.  No obstructing nasopharyngeal lesion or enhancement is evident.   Original Report Authenticated By: Marin Roberts, M.D.   Nm  Pet Image Initial (pi) Skull Base To Thigh  03/16/2013   *RADIOLOGY REPORT*  Clinical Data: Initial treatment strategy for left lung mass. History of smoking and weight loss.  NUCLEAR MEDICINE PET SKULL BASE TO THIGH  Fasting Blood Glucose:  110  Technique:  18.4 mCi F-18 FDG was injected intravenously. CT data was obtained and used for attenuation correction and anatomic localization only.  (This was not acquired as a diagnostic CT examination.) Additional exam technical data entered on technologist worksheet.  Comparison:  Chest CT 03/06/2013.  Findings:  Neck: No hypermetabolic lymph nodes in the neck.  There is physiologic activity associate with the vocal cords.  Chest:  The large left lung mass is markedly hypermetabolic.  The hypermetabolic activity is primarily peripheral consistent with central necrosis.  SUV max is 30.2.  This mass measures 9.3 x 7.1 cm transverse and has an epicenter in the upper lobe, although crosses the major fissure.  Apart from this dominant mass, there is no hypermetabolic pulmonary or mediastinal nodal activity.  Abdomen/Pelvis:  No abnormal hypermetabolic activity within the liver, pancreas, adrenal glands, or spleen.  Both adrenal glands demonstrate mild low density prominence without abnormal metabolic activity.  No hypermetabolic lymph nodes in the abdomen or pelvis. Minimal biliary prominence appears unchanged.  Skeleton:  No focal hypermetabolic activity to suggest skeletal metastasis.  IMPRESSION:  1.  The large left lung mass is markedly hypermetabolic consistent with bronchogenic carcinoma.  This involves the upper and lower lobes and occludes the upper lobe bronchus. 2.  No mediastinal invasion or distant metastases demonstrated.   Original Report Authenticated By: Carey Bullocks, M.D.   Ct Biopsy  03/26/2013   *RADIOLOGY REPORT*  Clinical history:72 year old with left lung mass.  PROCEDURE(S): CT GUIDED LEFT LUNG MASS BIOPSY  Physician: Rachelle Hora. Henn, MD   Medications:Versed 1.5 mg, Fentanyl 50 mcg. A radiology nurse monitored the patient for moderate sedation.  Moderate sedation time:11 minutes  Procedure:The procedure was explained to the patient.  The risks and benefits of the procedure were discussed and the patient's questions were addressed.  Informed consent was obtained from the patient.  The patient was placed supine on the CT scanner.  Images through the chest were obtained.  The left breast tissue was taped to the middle of the chest.  The left lateral chest was prepped and draped in a sterile fashion.  Skin was anesthetized with 1% lidocaine.  17 gauge guide needle was directed in the left lung mass with CT guidance.  Four core biopsies were obtained with an 18 gauge core device.  Findings:Large mass involving the left upper and lower lobes. Needle placement confirmed within the lesion.  No evidence for a pneumothorax following the core biopsies.  Complications: None  Impression:CT guided core biopsies of the left lung mass.  Original Report Authenticated By: Richarda Overlie, M.D.     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  She is not an ideal surgical candidate. She may benefit from thoracic radiotherapy with chemo.  PLAN:Today, I talked to the patient and family about the findings and work-up thus far.  We discussed the natural history of locally advanced non-small cell lung cancer and general treatment, highlighting the role or radiotherapy in the management.  We discussed the available radiation techniques, and focused on the details of logistics and delivery.  We reviewed the anticipated acute and late sequelae associated with radiation in this setting.  The patient was encouraged to ask questions that I answered to the best of my ability.  I filled out a patient counseling form during our discussion including treatment  diagrams.  We retained a copy for our records.  The patient would like to proceed with radiation and will be scheduled for CT simulation tomorrow.  I spent 60 minutes minutes face to face with the patient and more than 50% of that time was spent in counseling and/or coordination of care.  She may be ready to start 6 1/2 weeks of radiation on 04/09/13.   ------------------------------------------------  [name]. Kathrynn Running, M.D.

## 2013-03-29 NOTE — Telephone Encounter (Signed)
gv pt appt schedule for June and July.  °

## 2013-03-29 NOTE — Progress Notes (Signed)
Spoke with pt today at [facility].  She did not want to fill out distress screening.  She left before I could give her information on lung cancer and resources.

## 2013-03-30 ENCOUNTER — Encounter: Payer: Self-pay | Admitting: Internal Medicine

## 2013-03-30 ENCOUNTER — Ambulatory Visit
Admission: RE | Admit: 2013-03-30 | Discharge: 2013-03-30 | Disposition: A | Discharge status: Home / Self Care | Payer: Medicare Other | Source: Ambulatory Visit | Attending: Radiation Oncology | Admitting: Radiation Oncology

## 2013-03-30 DIAGNOSIS — C3410 Malignant neoplasm of upper lobe, unspecified bronchus or lung: Secondary | ICD-10-CM | POA: Insufficient documentation

## 2013-03-30 DIAGNOSIS — Y842 Radiological procedure and radiotherapy as the cause of abnormal reaction of the patient, or of later complication, without mention of misadventure at the time of the procedure: Secondary | ICD-10-CM | POA: Insufficient documentation

## 2013-03-30 DIAGNOSIS — C3430 Malignant neoplasm of lower lobe, unspecified bronchus or lung: Secondary | ICD-10-CM | POA: Insufficient documentation

## 2013-03-30 DIAGNOSIS — Z79899 Other long term (current) drug therapy: Secondary | ICD-10-CM | POA: Insufficient documentation

## 2013-03-30 DIAGNOSIS — L589 Radiodermatitis, unspecified: Secondary | ICD-10-CM | POA: Insufficient documentation

## 2013-03-30 DIAGNOSIS — Z510 Encounter for antineoplastic radiation therapy: Secondary | ICD-10-CM | POA: Insufficient documentation

## 2013-03-30 NOTE — Progress Notes (Signed)
  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   NARRATIVE:  The patient was brought to the CT Simulation planning suite.  Identity was confirmed.  All relevant records and images related to the planned course of therapy were reviewed.  The patient freely provided informed written consent to proceed with treatment after reviewing the details related to the planned course of therapy. The consent form was witnessed and verified by the simulation staff.  Then, the patient was set-up in a stable reproducible  supine position for radiation therapy.  CT images were obtained.  Surface markings were placed.  The CT images were loaded into the planning software.  Then the target and avoidance structures were contoured.  Treatment planning then occurred.  The radiation prescription was entered and confirmed.  Then, I designed and supervised the construction of a total of 4 medically necessary complex treatment devices. Each of the devices was a multileaf collimator aperture shaping radiation around the target while maximally sparing the nearby lungs and especially the heart which is adjacent to the tumor. The angles of the radiation fields were carefully designed left anterior oblique and right posterior oblique the skin along the surface the tumor between the tumor and heart. In addition,, 2 dynamic conformal arcs were set up from 5 to 175 to further shaped dose around the target while sparing critical structures adjacent. I have requested : 3D Simulation  I have requested a DVH of the following structures: heart, cord, lungs, and targets.  I have ordered:Nutrition Consult  SPECIAL TREATMENT PROCEDURE:  The planned course of  therapy using radiation constitutes a special treatment procedure. Special care is required in the management of this patient for the following reasons.  I have requested : This treatment constitutes a Special Treatment Procedure for the following reason: [ Concurrent chemotherapy requiring careful monitoring for increased toxicities of treatment including weekly laboratory values.. The special nature of the planned course of radiotherapy will require increased physician supervision and oversight to ensure patient's safety with optimal treatment outcomes.  PLAN:  The patient will receive 66 Gy in 33 fractions starting 6/23.  ________________________________  [name]. Kathrynn Running, M.D.

## 2013-03-30 NOTE — Patient Instructions (Signed)
You have unresectable stage IIb non-small cell lung cancer. We discussed treatment options including concurrent chemoradiation. First dose expected on 04/09/2013. Followup visit in 2 weeks.

## 2013-03-30 NOTE — Progress Notes (Signed)
[facility] [phone]   Fax:([phone]) [phone] Multidisciplinary Thoracic Oncology Clinic [facility])  CONSULT NOTE  REFERRING PHYSICIAN: Dr. Sheliah Plane  REASON FOR CONSULTATION:  72 years old white female with unresectable lung cancer.  HPI Chelsea Cruz is a 72 y.o. female with past medical history significant for hypertension, osteopenia and osteomalacia as well as long history of smoking. The patient was seen by her primary care physician Dr. Gweneth Dimitri for routine physical examination and complained of recent weight loss as well as shortness breath with exertion and lack of energy. Dr. Marikay Alar ordered chest x-ray and it showed large left lung mass. This was followed by CT scan of the chest on 03/06/2013 and it showed a large soft tissue mass occupying much of the mid left upper lobe extending anteriorly. This mass measures 6.9 x 9.4 x 5.9 cm and  encircles the bronchus to the left upper lobe, consistent with primary lung carcinoma. Coarse markings adjacent to this mass posteriorly and inferiorly may represent lymphangitic spread. Small mediastinal lymph nodes, none of which are pathologically enlarged. The patient was referred to Dr. Tyrone Sage and she had a PET scan as well as MRI of the brain performed on 03/16/2013. MRI of the brain showed no evidence for metastatic disease to the brain or meninges. The PET scan showed the large left lung mass was markedly hypermetabolic consistent with bronchogenic carcinoma and it involves the upper and lower lobes and includes the upper lobe bronchus. There was no mediastinal invasion or distant metastasis demonstrated. On 03/26/2013 the patient underwent CT-guided left lung mass biopsy by interventional radiology.  The final pathology (Accession: [id number]) was positive for poorly differentiated squamous cell carcinoma. Immunohistochemical stains are performed. The tumor is positive for cytokeratin 5/6 and cytokeratin  AE1/AE3 while it is negative for TTF-1. The morphology coupled with the staining pattern is consistent with the above diagnosis. Because of the involvement of the left upper and lower lobes and the poor pulmonary function, Dr. Tyrone Sage found that the patient will not be able to tolerate left pneumonectomy and he referred her to me today for evaluation and recommendation regarding treatment of her recently diagnosed lung cancer. The patient is feeling fine today with no specific complaints except for shortness of breath with exertion and weight loss around 9 pounds over the last 12 months. She denied having any significant chest pain, cough or hemoptysis. She denied having any headache or blurry vision. She has no nausea or vomiting. Family history significant for a mother who died from heart disease at age 68 and father died in an accident in early age. The patient has a brother with brain cancer. She is a widow and has 2 daughter Chelsea Cruz and Chelsea Cruz. She was accompanied today by her daughter Chelsea Cruz and her husband. The patient has a history of smoking one pack per day for around 50 years and quit in May of 2014, no history of alcohol or drug abuse.  @SFHPI @  Past Medical History  Diagnosis Date  . Nodule of left lung   . Hypertension   . Nicotine addiction   . Osteopenia   . Osteomalacia   . Impaired fasting glucose   . Hypercholesteremia   . Depression     Past Surgical History  Procedure Laterality Date  . Cholecystectomy    . Right oophorectomy      1969    Family History  Problem Relation Age of Onset  . Heart disease Mother   . Coronary  artery disease Brother   . Alzheimer's disease Brother   . Hypertension Brother     #2  . Stroke Brother     #2  . Brain cancer Brother     #3    Social History History  Substance Use Topics  . Smoking status: Current Every Day Smoker    Types: Cigarettes  . Smokeless tobacco: Not on file  . Alcohol Use: No    Allergies    Allergen Reactions  . Penicillins     Current Outpatient Prescriptions  Medication Sig Dispense Refill  . alendronate (FOSAMAX) 70 MG tablet Take 70 mg by mouth every 7 (seven) days.       Marland Kitchen amLODipine (NORVASC) 2.5 MG tablet 2.5 mg daily.       . cholecalciferol (VITAMIN D) 1000 UNITS tablet Take 3,000 Units by mouth daily. Takes 3 capsules      . COMBIVENT RESPIMAT 20-100 MCG/ACT AERS respimat 1 puff every 6 (six) hours as needed for wheezing.       Marland Kitchen lisinopril (PRINIVIL,ZESTRIL) 10 MG tablet Take 10 mg by mouth daily.        No current facility-administered medications for this visit.    Review of Systems  A comprehensive review of systems was negative except for: Constitutional: positive for fatigue and weight loss Respiratory: positive for dyspnea on exertion  Physical Exam  GUY:QIHKV, healthy, no distress, well nourished and well developed SKIN: skin color, texture, turgor are normal HEAD: Normocephalic, No masses, lesions, tenderness or abnormalities EYES: normal, PERRLA EARS: External ears normal OROPHARYNX:no exudate and no erythema  NECK: supple, no adenopathy LYMPH:  no palpable lymphadenopathy, no hepatosplenomegaly BREAST:not examined LUNGS: clear to auscultation  HEART: regular rate & rhythm, no murmurs and no gallops ABDOMEN:abdomen soft, non-tender, normal bowel sounds and no masses or organomegaly BACK: Back symmetric, no curvature. EXTREMITIES:no joint deformities, effusion, or inflammation, no edema, no skin discoloration, no clubbing  NEURO: alert & oriented x 3 with fluent speech, no focal motor/sensory deficits  PERFORMANCE STATUS: ECOG 1  LABORATORY DATA: Lab Results  Component Value Date   WBC 13.2* 03/26/2013   HGB 11.4* 03/26/2013   HCT 36.5 03/26/2013   MCV 79.3 03/26/2013   PLT 587* 03/26/2013      Chemistry      Component Value Date/Time   CREATININE 0.79 03/16/2013 1500   No results found for this basename: CALCIUM, ALKPHOS, AST, ALT,  BILITOT       RADIOGRAPHIC STUDIES: Dg Chest 1 View  03/26/2013   *RADIOLOGY REPORT*  Clinical Data: Left lung biopsy.  CHEST - 1 VIEW  Comparison: Chest x-ray 03/06/2013 and CT 03/06/2013.  CT biopsy earlier today.  Findings: The mass appears unchanged.  There is no pneumothorax. Slight left effusion  IMPRESSION: No pneumothorax post biopsy.   Original Report Authenticated By: Davonna Belling, M.D.   Ct Chest W Contrast  03/06/2013   *RADIOLOGY REPORT*  Clinical Data: Abnormal chest x-ray, smoking history, cough  CT CHEST WITH CONTRAST  Technique:  Multidetector CT imaging of the chest was performed following the standard protocol during bolus administration of intravenous contrast.  Contrast:  75 ml Omnipaque-300  Comparison: Chest x-ray of 03/06/2013  Findings: At the site of the opacity on chest x-ray, there is a large soft tissue mass occupying much of the mid left upper lobe extending anteriorly.  This mass measures 6.9 x 9.4 x 5.9 cm and encircles the bronchus to the left upper lobe, consistent with primary lung carcinoma.  Coarse markings adjacent to this mass posteriorly and inferiorly may represent lymphangitic spread. Diffuse changes of centrilobular emphysema are noted.  No right lung involvement is noted.  No pleural effusion is seen.  On soft tissue window images, the thyroid gland is unremarkable. The thoracic aorta opacifies and the origins of the great vessels are patent.  The pulmonary arteries also are unremarkable.  On the soft tissue window images the mass in the left upper lobe appears to contain multiple areas of necrosis.  There are small mediastinal lymph nodes present none of which appear pathologically enlarged currently.  No evidence of liver metastatic involvement is seen on the few images obtained although there is some prominence of central intrahepatic ducts.  No bony abnormality is noted.  IMPRESSION:  1.  Large soft tissue mass occupies much of the mid and anterior left upper  lobe consistent with primary lung carcinoma, encircling the left upper lobe bronchus. 2.  Small mediastinal lymph nodes, none of which are pathologically enlarged. 3.  Diffuse centrilobular emphysema. 4.  Slight prominence of central intrahepatic ducts of questionable significance on the few images through the upper abdomen obtained.   Original Report Authenticated By: Dwyane Dee, M.D.   Mr [name] VH Contrast  03/16/2013   *RADIOLOGY REPORT*  Clinical Data: Lung cancer.  Headaches.  MRI HEAD WITHOUT AND WITH CONTRAST  Technique:  Multiplanar, multiecho pulse sequences of the brain and surrounding structures were obtained according to standard protocol without and with intravenous contrast  Contrast: 10mL MULTIHANCE GADOBENATE DIMEGLUMINE 529 MG/ML IV SOLN  Comparison: None.  Findings: No acute infarct, hemorrhage, mass lesion is present.  The postcontrast images demonstrate no pathologic enhancement to suggest metastatic disease of the brain or meninges.  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  Mild generalized atrophy is within normal limits for age.  Periventricular subcortical T2 hyperintensities are slightly greater than expected for age.  There is no associated enhancement.  Flow is present in the major intracranial arteries.  The patient is status post bilateral lens extractions.  The paranasal sinuses are clear.  There is some fluid in the left mastoid air cells.  No obstructing nasopharyngeal lesion is evident.  IMPRESSION:  1.  No evidence for metastatic disease of the brain or meninges. 2.  Mild atrophy white matter disease.  This likely reflects the sequelae of chronic microvascular ischemia. 3.  Left mastoid effusion.  No obstructing nasopharyngeal lesion or enhancement is evident.   Original Report Authenticated By: Marin Roberts, M.D.   Nm Pet Image Initial (pi) Skull Base To Thigh  03/16/2013   *RADIOLOGY  REPORT*  Clinical Data: Initial treatment strategy for left lung mass. History of smoking and weight loss.  NUCLEAR MEDICINE PET SKULL BASE TO THIGH  Fasting Blood Glucose:  110  Technique:  18.4 mCi F-18 FDG was injected intravenously. CT data was obtained and used for attenuation correction and anatomic localization only.  (This was not acquired as a diagnostic CT examination.) Additional exam technical data entered on technologist worksheet.  Comparison:  Chest CT 03/06/2013.  Findings:  Neck: No hypermetabolic lymph nodes in the neck.  There is physiologic activity associate with the vocal cords.  Chest:  The large left lung mass is markedly hypermetabolic.  The hypermetabolic activity is primarily peripheral consistent with central necrosis.  SUV max is 30.2.  This mass measures 9.3 x 7.1 cm transverse and has an epicenter in the  upper lobe, although crosses the major fissure.  Apart from this dominant mass, there is no hypermetabolic pulmonary or mediastinal nodal activity.  Abdomen/Pelvis:  No abnormal hypermetabolic activity within the liver, pancreas, adrenal glands, or spleen.  Both adrenal glands demonstrate mild low density prominence without abnormal metabolic activity.  No hypermetabolic lymph nodes in the abdomen or pelvis. Minimal biliary prominence appears unchanged.  Skeleton:  No focal hypermetabolic activity to suggest skeletal metastasis.  IMPRESSION:  1.  The large left lung mass is markedly hypermetabolic consistent with bronchogenic carcinoma.  This involves the upper and lower lobes and occludes the upper lobe bronchus. 2.  No mediastinal invasion or distant metastases demonstrated.   Original Report Authenticated By: Carey Bullocks, M.D.   Ct Biopsy  03/26/2013   *RADIOLOGY REPORT*  Clinical history:72 year old with left lung mass.  PROCEDURE(S): CT GUIDED LEFT LUNG MASS BIOPSY  Physician: Rachelle Hora. Henn, MD  Medications:Versed 1.5 mg, Fentanyl 50 mcg. A radiology nurse monitored the  patient for moderate sedation.  Moderate sedation time:11 minutes  Procedure:The procedure was explained to the patient.  The risks and benefits of the procedure were discussed and the patient's questions were addressed.  Informed consent was obtained from the patient.  The patient was placed supine on the CT scanner.  Images through the chest were obtained.  The left breast tissue was taped to the middle of the chest.  The left lateral chest was prepped and draped in a sterile fashion.  Skin was anesthetized with 1% lidocaine.  17 gauge guide needle was directed in the left lung mass with CT guidance.  Four core biopsies were obtained with an 18 gauge core device.  Findings:Large mass involving the left upper and lower lobes. Needle placement confirmed within the lesion.  No evidence for a pneumothorax following the core biopsies.  Complications: None  Impression:CT guided core biopsies of the left lung mass.   Original Report Authenticated By: Richarda Overlie, M.D.    ASSESSMENT: This is a very pleasant 72 years old white female recently diagnosed with unresectable stage IIB (T3, N0, M0) presenting with large left lung mass involving the left upper and lower lobe and the patient will not tolerate left pneumonectomy.   PLAN: I had a lengthy discussion with the patient and her family today about her disease stage, prognosis and treatment options. I recommended for the patient the course of concurrent chemoradiation with weekly carboplatin for AUC of 2 and paclitaxel 45 mg/M2 for a total of 6-7 weeks  Concurrent with radiation. I discussed with the patient adverse effect of the chemotherapy including but not limited to alopecia, myelosuppression, nausea and vomiting, peripheral neuropathy, liver or renal dysfunction. The patient would have a chemotherapy education class before starting the first cycle of her chemotherapy. I will call her pharmacy was prescription for Compazine 10 mg by mouth every 6 hours as  needed for nausea. The patient was seen earlier today by Dr. Kathrynn Running for discussion of the concurrent radiotherapy. For anemia, I advised the patient to start taking over-the-counter oral iron tablets 2 tablets every day. We expect to start this course of concurrent chemoradiation on 04/09/2013. The patient would come back for follow up visit in 2 weeks for reevaluation and management any adverse effect of her chemotherapy. I gave the patient and her family the time to ask questions and I answered them completely to their satisfaction. The patient was advised to call immediately if she has any concerning symptoms in the interval. For smoke cessation,  the patient to quit smoking recently and she was strongly advised to avoid smoking in the future. All questions were answered. The patient knows to call the clinic with any problems, questions or concerns. We can certainly see the patient much sooner if necessary.  Thank you so much for allowing me to participate in the care of Priscille Loveless. I will continue to follow up the patient with you and assist in her care.  I spent 40 minutes counseling the patient face to face. The total time spent in the appointment was 60 minutes.   Kaylaann Mountz K. 03/30/2013, 8:33 PM

## 2013-04-03 ENCOUNTER — Encounter: Payer: Self-pay | Admitting: *Deleted

## 2013-04-03 ENCOUNTER — Other Ambulatory Visit: Payer: Medicare Other

## 2013-04-06 ENCOUNTER — Ambulatory Visit
Admission: RE | Admit: 2013-04-06 | Discharge: 2013-04-06 | Disposition: A | Discharge status: Home / Self Care | Payer: Medicare Other | Source: Ambulatory Visit | Attending: Radiation Oncology | Admitting: Radiation Oncology

## 2013-04-06 MED ORDER — PROCHLORPERAZINE MALEATE 10 MG PO TABS: 10.0000 mg | ORAL_TABLET | Freq: Four times a day (QID) | ORAL | Status: DC | PRN

## 2013-04-06 NOTE — Addendum Note (Signed)
Addended by: Caren Griffins on: 04/06/2013 11:59 AM   Modules accepted: Orders

## 2013-04-09 ENCOUNTER — Telehealth: Payer: Self-pay | Admitting: Internal Medicine

## 2013-04-09 ENCOUNTER — Ambulatory Visit
Admission: RE | Admit: 2013-04-09 | Discharge: 2013-04-09 | Disposition: A | Discharge status: Home / Self Care | Payer: Medicare Other | Source: Ambulatory Visit | Attending: Radiation Oncology | Admitting: Radiation Oncology

## 2013-04-09 ENCOUNTER — Other Ambulatory Visit (HOSPITAL_BASED_OUTPATIENT_CLINIC_OR_DEPARTMENT_OTHER): Payer: Medicare Other | Admitting: Lab

## 2013-04-09 ENCOUNTER — Ambulatory Visit (HOSPITAL_BASED_OUTPATIENT_CLINIC_OR_DEPARTMENT_OTHER): Payer: Medicare Other

## 2013-04-09 DIAGNOSIS — C3492 Malignant neoplasm of unspecified part of left bronchus or lung: Secondary | ICD-10-CM

## 2013-04-09 DIAGNOSIS — C3410 Malignant neoplasm of upper lobe, unspecified bronchus or lung: Secondary | ICD-10-CM

## 2013-04-09 DIAGNOSIS — Z5111 Encounter for antineoplastic chemotherapy: Secondary | ICD-10-CM

## 2013-04-09 LAB — COMPREHENSIVE METABOLIC PANEL (CC13)
ALT: 14 U/L (ref 0–55)
AST: 13 U/L (ref 5–34)
Alkaline Phosphatase: 66 U/L (ref 40–150)
Creatinine: 0.8 mg/dL (ref 0.6–1.1)
Total Bilirubin: 0.86 mg/dL (ref 0.20–1.20)

## 2013-04-09 LAB — CBC WITH DIFFERENTIAL/PLATELET
BASO%: 0.2 % (ref 0.0–2.0)
EOS%: 0.6 % (ref 0.0–7.0)
LYMPH%: 13.6 % — ABNORMAL LOW (ref 14.0–49.7)
MCH: 24.6 pg — ABNORMAL LOW (ref 25.1–34.0)
MCHC: 31.8 g/dL (ref 31.5–36.0)
MCV: 77.4 fL — ABNORMAL LOW (ref 79.5–101.0)
MONO#: 1.5 10*3/uL — ABNORMAL HIGH (ref 0.1–0.9)
MONO%: 8.9 % (ref 0.0–14.0)
Platelets: 556 10*3/uL — ABNORMAL HIGH (ref 145–400)
RBC: 4.43 10*6/uL (ref 3.70–5.45)
WBC: 16.7 10*3/uL — ABNORMAL HIGH (ref 3.9–10.3)
nRBC: 0 % (ref 0–0)

## 2013-04-09 MED ORDER — SODIUM CHLORIDE 0.9 % IV SOLN
Freq: Once | INTRAVENOUS | Status: AC
  Administered 2013-04-09: 12:00:00 via INTRAVENOUS

## 2013-04-09 MED ORDER — SODIUM CHLORIDE 0.9 % IV SOLN
45.0000 mg/m2 | Freq: Once | INTRAVENOUS | Status: AC
  Administered 2013-04-09: 66 mg via INTRAVENOUS
  Filled 2013-04-09: qty 11

## 2013-04-09 MED ORDER — SODIUM CHLORIDE 0.9 % IV SOLN: 120.0000 mg | Freq: Once | INTRAVENOUS | Status: DC

## 2013-04-09 MED ORDER — SODIUM CHLORIDE 0.9 % IV SOLN
150.0000 mg | Freq: Once | INTRAVENOUS | Status: AC
  Administered 2013-04-09: 150 mg via INTRAVENOUS
  Filled 2013-04-09: qty 15

## 2013-04-09 MED ORDER — DIPHENHYDRAMINE HCL 50 MG/ML IJ SOLN
50.0000 mg | Freq: Once | INTRAMUSCULAR | Status: AC
  Administered 2013-04-09: 50 mg via INTRAVENOUS

## 2013-04-09 MED ORDER — ONDANSETRON 16 MG/50ML IVPB (CHCC)
16.0000 mg | Freq: Once | INTRAVENOUS | Status: AC
  Administered 2013-04-09: 16 mg via INTRAVENOUS

## 2013-04-09 MED ORDER — FAMOTIDINE IN NACL 20-0.9 MG/50ML-% IV SOLN
20.0000 mg | Freq: Once | INTRAVENOUS | Status: AC
  Administered 2013-04-09: 20 mg via INTRAVENOUS

## 2013-04-09 MED ORDER — DEXTROSE 5 % IV SOLN: 45.0000 mg/m2 | Freq: Once | INTRAVENOUS | Status: DC

## 2013-04-09 MED ORDER — DEXAMETHASONE SODIUM PHOSPHATE 20 MG/5ML IJ SOLN
20.0000 mg | Freq: Once | INTRAMUSCULAR | Status: AC
  Administered 2013-04-09: 20 mg via INTRAVENOUS

## 2013-04-09 NOTE — Telephone Encounter (Signed)
Gave pt appt for lab and MD for July 2014 °

## 2013-04-09 NOTE — Patient Instructions (Addendum)
[facility] Discharge Instructions for Patients Receiving Chemotherapy  Today you received the following chemotherapy agents Taxol/Carboplatin To help prevent nausea and vomiting after your treatment, we encourage you to take your nausea medication as prescribed.  If you develop nausea and vomiting that is not controlled by your nausea medication, call the clinic.   BELOW ARE SYMPTOMS THAT SHOULD BE REPORTED IMMEDIATELY:  *FEVER GREATER THAN 100.5 F  *CHILLS WITH OR WITHOUT FEVER  NAUSEA AND VOMITING THAT IS NOT CONTROLLED WITH YOUR NAUSEA MEDICATION  *UNUSUAL SHORTNESS OF BREATH  *UNUSUAL BRUISING OR BLEEDING  TENDERNESS IN MOUTH AND THROAT WITH OR WITHOUT PRESENCE OF ULCERS  *URINARY PROBLEMS  *BOWEL PROBLEMS  UNUSUAL RASH Items with * indicate a potential emergency and should be followed up as soon as possible.  Feel free to call the clinic you have any questions or concerns. The clinic phone number is [phone].    

## 2013-04-10 ENCOUNTER — Telehealth: Payer: Self-pay | Admitting: *Deleted

## 2013-04-10 ENCOUNTER — Ambulatory Visit
Admission: RE | Admit: 2013-04-10 | Discharge: 2013-04-10 | Disposition: A | Discharge status: Home / Self Care | Payer: Medicare Other | Source: Ambulatory Visit | Attending: Radiation Oncology | Admitting: Radiation Oncology

## 2013-04-10 NOTE — Telephone Encounter (Signed)
Message copied by Wende Mott on Tue Apr 10, 2013  2:56 PM ------      Message from: Jolaine Artist      Created: Mon Apr 09, 2013  3:08 PM      Regarding: chemo follow-up call       1st time Taxol/Carbo Dr. Arbutus Ped  [phone] (pt will be at this number on 6/24) ------

## 2013-04-10 NOTE — Telephone Encounter (Signed)
Called the phone number pt left for f/u chemo call and no answer and no identifying information on answering machine.  Did not leave message.  Called home number and no answer or identifying info on answering machine.  Did not leave message.

## 2013-04-10 NOTE — Telephone Encounter (Signed)
Pt denies any n/v/d or fevers.  States eating and drinking well and has been out of the house most of the day today.  She denies any questions/ concerns and verbalizes understanding to call us for any fever greater than 100.5 F or uncontrolled symptoms.

## 2013-04-11 ENCOUNTER — Ambulatory Visit
Admission: RE | Admit: 2013-04-11 | Discharge: 2013-04-11 | Disposition: A | Discharge status: Home / Self Care | Payer: Medicare Other | Source: Ambulatory Visit | Attending: Radiation Oncology | Admitting: Radiation Oncology

## 2013-04-11 ENCOUNTER — Encounter: Payer: Self-pay | Admitting: Radiation Oncology

## 2013-04-11 MED ORDER — RADIAPLEXRX EX GEL
Freq: Once | CUTANEOUS | Status: AC
  Administered 2013-04-11: 10:00:00 via TOPICAL

## 2013-04-11 NOTE — Progress Notes (Signed)
Complete post sim education with patient. Oriented patient to staff and routine of the clinic. Provided patient with RADIATION THERAPY AND YOU handbook then, reviewed pertinent information. Educated patient on potential side effects and management such as, fatigue, skin changes, pain/difficulty swallowing. Provided patient with radiaplex gel then, directed upon use. All questions answered. Encouraged patient to contact staff with future needs. Patient verbalized understanding.

## 2013-04-11 NOTE — Progress Notes (Signed)
  Radiation Oncology         [phone] ________________________________  Name: Chelsea Cruz MRN: [id number]  Date: 04/11/2013  DOB: [birth year]  Weekly Radiation Therapy Management  Current Dose: 6 Gy     Planned Dose:  66 Gy  Narrative . . . . . . . . The patient presents for routine under treatment assessment.                                                      The patient is without complaint.                                 Set-up films were reviewed.                                 The chart was checked. Physical Findings. . .  weight is 103 lb 6.4 oz (46.902 kg). Her oral temperature is 98.6 F (37 C). Her blood pressure is 147/72 and her pulse is 102. Her respiration is 18 and oxygen saturation is 97%. . Weight essentially stable.  No significant changes. Impression . . . . . . . The patient is  tolerating radiation. Plan . . . . . . . . . . . . Continue treatment as planned.  ________________________________  [name]. Kathrynn Running, M.D.

## 2013-04-11 NOTE — Addendum Note (Signed)
Encounter addended by: Agnes Lawrence, RN on: 04/11/2013  9:45 AM<BR>     Documentation filed: Notes Section, Inpatient Patient Education, Inpatient Document Flowsheet, Orders

## 2013-04-11 NOTE — Addendum Note (Signed)
Encounter addended by: Agnes Lawrence, RN on: 04/11/2013  9:47 AM<BR>     Documentation filed: Inpatient MAR

## 2013-04-11 NOTE — Progress Notes (Signed)
Reports shortness of breath with exertion. Reports an occasional dry cough. Denies pain or difficulty swallowing. Denies skin changes within treatment field. Given radiaplex and directed upon use. Denies fatigue.

## 2013-04-12 ENCOUNTER — Ambulatory Visit
Admission: RE | Admit: 2013-04-12 | Discharge: 2013-04-12 | Disposition: A | Discharge status: Home / Self Care | Payer: Medicare Other | Source: Ambulatory Visit | Attending: Radiation Oncology | Admitting: Radiation Oncology

## 2013-04-13 ENCOUNTER — Ambulatory Visit
Admission: RE | Admit: 2013-04-13 | Discharge: 2013-04-13 | Disposition: A | Discharge status: Home / Self Care | Payer: Medicare Other | Source: Ambulatory Visit | Attending: Radiation Oncology | Admitting: Radiation Oncology

## 2013-04-16 ENCOUNTER — Ambulatory Visit (HOSPITAL_BASED_OUTPATIENT_CLINIC_OR_DEPARTMENT_OTHER): Payer: Medicare Other | Admitting: Internal Medicine

## 2013-04-16 ENCOUNTER — Telehealth: Payer: Self-pay | Admitting: Internal Medicine

## 2013-04-16 ENCOUNTER — Encounter: Payer: Self-pay | Admitting: Internal Medicine

## 2013-04-16 ENCOUNTER — Other Ambulatory Visit (HOSPITAL_BASED_OUTPATIENT_CLINIC_OR_DEPARTMENT_OTHER): Payer: Medicare Other

## 2013-04-16 ENCOUNTER — Ambulatory Visit (HOSPITAL_BASED_OUTPATIENT_CLINIC_OR_DEPARTMENT_OTHER): Payer: Medicare Other

## 2013-04-16 ENCOUNTER — Ambulatory Visit
Admission: RE | Admit: 2013-04-16 | Discharge: 2013-04-16 | Disposition: A | Discharge status: Home / Self Care | Payer: Medicare Other | Source: Ambulatory Visit | Attending: Radiation Oncology | Admitting: Radiation Oncology

## 2013-04-16 DIAGNOSIS — C3410 Malignant neoplasm of upper lobe, unspecified bronchus or lung: Secondary | ICD-10-CM

## 2013-04-16 DIAGNOSIS — C3490 Malignant neoplasm of unspecified part of unspecified bronchus or lung: Secondary | ICD-10-CM

## 2013-04-16 DIAGNOSIS — Z5111 Encounter for antineoplastic chemotherapy: Secondary | ICD-10-CM

## 2013-04-16 DIAGNOSIS — C3492 Malignant neoplasm of unspecified part of left bronchus or lung: Secondary | ICD-10-CM

## 2013-04-16 LAB — CBC WITH DIFFERENTIAL/PLATELET
BASO%: 0.5 % (ref 0.0–2.0)
Basophils Absolute: 0.1 10*3/uL (ref 0.0–0.1)
EOS%: 1.7 % (ref 0.0–7.0)
HCT: 31.5 % — ABNORMAL LOW (ref 34.8–46.6)
HGB: 9.9 g/dL — ABNORMAL LOW (ref 11.6–15.9)
LYMPH%: 15.4 % (ref 14.0–49.7)
MCH: 24.4 pg — ABNORMAL LOW (ref 25.1–34.0)
MCHC: 31.4 g/dL — ABNORMAL LOW (ref 31.5–36.0)
NEUT%: 74.5 % (ref 38.4–76.8)
Platelets: 486 10*3/uL — ABNORMAL HIGH (ref 145–400)

## 2013-04-16 LAB — COMPREHENSIVE METABOLIC PANEL (CC13)
AST: 12 U/L (ref 5–34)
Albumin: 3.3 g/dL — ABNORMAL LOW (ref 3.5–5.0)
Alkaline Phosphatase: 60 U/L (ref 40–150)
BUN: 14.1 mg/dL (ref 7.0–26.0)
Calcium: 9.8 mg/dL (ref 8.4–10.4)
Chloride: 97 mEq/L — ABNORMAL LOW (ref 98–109)
Creatinine: 0.7 mg/dL (ref 0.6–1.1)
Glucose: 117 mg/dl (ref 70–140)

## 2013-04-16 MED ORDER — DEXAMETHASONE SODIUM PHOSPHATE 20 MG/5ML IJ SOLN
20.0000 mg | Freq: Once | INTRAMUSCULAR | Status: AC
  Administered 2013-04-16: 20 mg via INTRAVENOUS

## 2013-04-16 MED ORDER — SODIUM CHLORIDE 0.9 % IV SOLN
45.0000 mg/m2 | Freq: Once | INTRAVENOUS | Status: AC
  Administered 2013-04-16: 66 mg via INTRAVENOUS
  Filled 2013-04-16: qty 11

## 2013-04-16 MED ORDER — ONDANSETRON 16 MG/50ML IVPB (CHCC)
16.0000 mg | Freq: Once | INTRAVENOUS | Status: AC
  Administered 2013-04-16: 16 mg via INTRAVENOUS

## 2013-04-16 MED ORDER — FAMOTIDINE IN NACL 20-0.9 MG/50ML-% IV SOLN
20.0000 mg | Freq: Once | INTRAVENOUS | Status: AC
  Administered 2013-04-16: 20 mg via INTRAVENOUS

## 2013-04-16 MED ORDER — SODIUM CHLORIDE 0.9 % IV SOLN
148.2000 mg | Freq: Once | INTRAVENOUS | Status: AC
  Administered 2013-04-16: 150 mg via INTRAVENOUS
  Filled 2013-04-16: qty 15

## 2013-04-16 MED ORDER — SODIUM CHLORIDE 0.9 % IV SOLN
Freq: Once | INTRAVENOUS | Status: AC
  Administered 2013-04-16: 15:00:00 via INTRAVENOUS

## 2013-04-16 MED ORDER — DIPHENHYDRAMINE HCL 50 MG/ML IJ SOLN
50.0000 mg | Freq: Once | INTRAMUSCULAR | Status: AC
  Administered 2013-04-16: 50 mg via INTRAVENOUS

## 2013-04-16 NOTE — Telephone Encounter (Signed)
gv and printed app tshced and avs for pt...MW added tx.Marland KitchenMarland Kitchen

## 2013-04-16 NOTE — Patient Instructions (Signed)
Continue concurrent chemoradiation as scheduled. Followup visit in 2 weeks 

## 2013-04-16 NOTE — Progress Notes (Signed)
[facility] Telephone:([phone]   [phone]  OFFICE PROGRESS NOTE  MCNEILL,[name], MD [address] [place] [address]  DIAGNOSIS: Stage IIB (T3, N0, M0) non-small cell lung cancer, poorly differentiated squamous cell carcinoma diagnosed in June of 2014.  PRIOR THERAPY: None  CURRENT THERAPY: Concurrent chemoradiation with weekly carboplatin for AUC of 2 and paclitaxel 45 mg/M2. Status post one cycle. First cycle was given on 04/09/2013.  INTERVAL HISTORY: Chelsea Cruz 72 y.o. female returns to the clinic today for followup visit. The patient is tolerating the first week of her treatment fairly well with no significant adverse effects. She denied having any significant chest pain, shortness breath, cough or hemoptysis. She has no nausea or vomiting. She has no fever or chills.  MEDICAL HISTORY: Past Medical History  Diagnosis Date  . Nodule of left lung   . Hypertension   . Nicotine addiction   . Osteopenia   . Osteomalacia   . Impaired fasting glucose   . Hypercholesteremia   . Depression     ALLERGIES:  is allergic to penicillins.  MEDICATIONS:  Current Outpatient Prescriptions  Medication Sig Dispense Refill  . alendronate (FOSAMAX) 70 MG tablet Take 70 mg by mouth every 7 (seven) days.       Marland Kitchen amLODipine (NORVASC) 2.5 MG tablet 2.5 mg daily.       . cholecalciferol (VITAMIN D) 1000 UNITS tablet Take 3,000 Units by mouth daily. Takes 3 capsules      . COMBIVENT RESPIMAT 20-100 MCG/ACT AERS respimat 1 puff every 6 (six) hours as needed for wheezing.       Marland Kitchen lisinopril (PRINIVIL,ZESTRIL) 10 MG tablet Take 10 mg by mouth daily.       . prochlorperazine (COMPAZINE) 10 MG tablet Take 1 tablet (10 mg total) by mouth every 6 (six) hours as needed (for nausea and vomiting).  30 tablet  1  . Wound Cleansers (RADIAPLEX EX) Apply topically.       No current facility-administered medications for this visit.    SURGICAL HISTORY:  Past  Surgical History  Procedure Laterality Date  . Cholecystectomy    . Right oophorectomy      1969    REVIEW OF SYSTEMS:  A comprehensive review of systems was negative.   PHYSICAL EXAMINATION: General appearance: alert, cooperative and no distress Head: Normocephalic, without obvious abnormality, atraumatic Neck: no adenopathy Lymph nodes: Cervical, supraclavicular, and axillary nodes normal. Resp: clear to auscultation bilaterally Cardio: regular rate and rhythm, S1, S2 normal, no murmur, click, rub or gallop GI: soft, non-tender; bowel sounds normal; no masses,  no organomegaly Extremities: extremities normal, atraumatic, no cyanosis or edema  ECOG PERFORMANCE STATUS: 1 - Symptomatic but completely ambulatory  Blood pressure 122/67, pulse 98, temperature 97.1 F (36.2 C), temperature source Oral, resp. rate 18, height 5\' 1"  (1.549 m), weight 105 lb (47.628 kg).  LABORATORY DATA: Lab Results  Component Value Date   WBC 10.8* 04/16/2013   HGB 9.9* 04/16/2013   HCT 31.5* 04/16/2013   MCV 77.8* 04/16/2013   PLT 486* 04/16/2013      Chemistry      Component Value Date/Time   NA 136 04/09/2013 1114   K 4.2 04/09/2013 1114   CL 98 04/09/2013 1114   CO2 24 04/09/2013 1114   BUN 12.6 04/09/2013 1114   CREATININE 0.8 04/09/2013 1114   CREATININE 0.79 03/16/2013 1500      Component Value Date/Time   CALCIUM  10.4 04/09/2013 1114   ALKPHOS 66 04/09/2013 1114   AST 13 04/09/2013 1114   ALT 14 04/09/2013 1114   BILITOT 0.86 04/09/2013 1114       RADIOGRAPHIC STUDIES: Dg Chest 1 View  03/26/2013   *RADIOLOGY REPORT*  Clinical Data: Left lung biopsy.  CHEST - 1 VIEW  Comparison: Chest x-ray 03/06/2013 and CT 03/06/2013.  CT biopsy earlier today.  Findings: The mass appears unchanged.  There is no pneumothorax. Slight left effusion  IMPRESSION: No pneumothorax post biopsy.   Original Report Authenticated By: Davonna Belling, M.D.   Ct Biopsy  03/26/2013   *RADIOLOGY REPORT*  Clinical  history:72 year old with left lung mass.  PROCEDURE(S): CT GUIDED LEFT LUNG MASS BIOPSY  Physician: Rachelle Hora. Henn, MD  Medications:Versed 1.5 mg, Fentanyl 50 mcg. A radiology nurse monitored the patient for moderate sedation.  Moderate sedation time:11 minutes  Procedure:The procedure was explained to the patient.  The risks and benefits of the procedure were discussed and the patient's questions were addressed.  Informed consent was obtained from the patient.  The patient was placed supine on the CT scanner.  Images through the chest were obtained.  The left breast tissue was taped to the middle of the chest.  The left lateral chest was prepped and draped in a sterile fashion.  Skin was anesthetized with 1% lidocaine.  17 gauge guide needle was directed in the left lung mass with CT guidance.  Four core biopsies were obtained with an 18 gauge core device.  Findings:Large mass involving the left upper and lower lobes. Needle placement confirmed within the lesion.  No evidence for a pneumothorax following the core biopsies.  Complications: None  Impression:CT guided core biopsies of the left lung mass.   Original Report Authenticated By: Richarda Overlie, M.D.    ASSESSMENT AND PLAN: This is a very pleasant 72 years old white female with a stage IIb non-small cell lung cancer currently undergoing concurrent chemoradiation with weekly carboplatin and paclitaxel is status post 1 weekly treatment. The patient is tolerating her treatment fairly well with no significant adverse effects. I recommended for her to proceed with cycle #2 today as scheduled. She would come back for followup visit in 2 weeks for evaluation and management any adverse effect of her treatment. She was advised to call immediately if she has any concerning symptoms in the interval. All questions were answered. The patient knows to call the clinic with any problems, questions or concerns. We can certainly see the patient much sooner if  necessary.

## 2013-04-16 NOTE — Patient Instructions (Addendum)
Cancer Center Discharge Instructions for Patients Receiving Chemotherapy  Today you received the following chemotherapy agents Taxol/Carboplatin  To help prevent nausea and vomiting after your treatment, we encourage you to take your nausea medication as directed.   If you develop nausea and vomiting that is not controlled by your nausea medication, call the clinic.   BELOW ARE SYMPTOMS THAT SHOULD BE REPORTED IMMEDIATELY:  *FEVER GREATER THAN 100.5 F  *CHILLS WITH OR WITHOUT FEVER  NAUSEA AND VOMITING THAT IS NOT CONTROLLED WITH YOUR NAUSEA MEDICATION  *UNUSUAL SHORTNESS OF BREATH  *UNUSUAL BRUISING OR BLEEDING  TENDERNESS IN MOUTH AND THROAT WITH OR WITHOUT PRESENCE OF ULCERS  *URINARY PROBLEMS  *BOWEL PROBLEMS  UNUSUAL RASH Items with * indicate a potential emergency and should be followed up as soon as possible.  Feel free to call the clinic you have any questions or concerns. The clinic phone number is [phone].    

## 2013-04-17 ENCOUNTER — Ambulatory Visit
Admission: RE | Admit: 2013-04-17 | Discharge: 2013-04-17 | Disposition: A | Discharge status: Home / Self Care | Payer: Medicare Other | Source: Ambulatory Visit | Attending: Radiation Oncology | Admitting: Radiation Oncology

## 2013-04-18 ENCOUNTER — Ambulatory Visit
Admission: RE | Admit: 2013-04-18 | Discharge: 2013-04-18 | Disposition: A | Discharge status: Home / Self Care | Payer: Medicare Other | Source: Ambulatory Visit | Attending: Radiation Oncology | Admitting: Radiation Oncology

## 2013-04-18 ENCOUNTER — Encounter: Payer: Self-pay | Admitting: Radiation Oncology

## 2013-04-18 HISTORY — DX: Malignant neoplasm of unspecified part of unspecified bronchus or lung: C34.90

## 2013-04-18 NOTE — Progress Notes (Signed)
  Radiation Oncology         [phone]) [phone] ________________________________  Name: Chelsea Cruz MRN: [id number]  Date: 04/18/2013  DOB: [birth year]  Weekly Radiation Therapy Management  Current Dose: 16 Gy     Planned Dose:  66 Gy  Narrative . . . . . . . . The patient presents for routine under treatment assessment.                                                The patient is without complaint.                                 Set-up films were reviewed.                                 The chart was checked. Physical Findings. . .  weight is 107 lb 9.6 oz (48.807 kg). Her oral temperature is 98.2 F (36.8 C). Her blood pressure is 104/49 and her pulse is 88. Her respiration is 20 and oxygen saturation is 100%. . Weight essentially stable.  No significant changes. Impression . . . . . . . The patient is  tolerating radiation. Plan . . . . . . . . . . . . Continue treatment as planned.  ________________________________  [name]. Kathrynn Running, M.D.

## 2013-04-18 NOTE — Progress Notes (Signed)
Pt denies pain, fatigue, loss of appetite, cough. She reports SOB w/exertion.

## 2013-04-19 ENCOUNTER — Ambulatory Visit
Admission: RE | Admit: 2013-04-19 | Discharge: 2013-04-19 | Disposition: A | Discharge status: Home / Self Care | Payer: Medicare Other | Source: Ambulatory Visit | Attending: Radiation Oncology | Admitting: Radiation Oncology

## 2013-04-23 ENCOUNTER — Ambulatory Visit (HOSPITAL_BASED_OUTPATIENT_CLINIC_OR_DEPARTMENT_OTHER): Payer: Medicare Other

## 2013-04-23 ENCOUNTER — Ambulatory Visit
Admission: RE | Admit: 2013-04-23 | Discharge: 2013-04-23 | Disposition: A | Discharge status: Home / Self Care | Payer: Medicare Other | Source: Ambulatory Visit | Attending: Radiation Oncology | Admitting: Radiation Oncology

## 2013-04-23 ENCOUNTER — Other Ambulatory Visit (HOSPITAL_BASED_OUTPATIENT_CLINIC_OR_DEPARTMENT_OTHER): Payer: Medicare Other | Admitting: Lab

## 2013-04-23 DIAGNOSIS — C3410 Malignant neoplasm of upper lobe, unspecified bronchus or lung: Secondary | ICD-10-CM

## 2013-04-23 DIAGNOSIS — C3492 Malignant neoplasm of unspecified part of left bronchus or lung: Secondary | ICD-10-CM

## 2013-04-23 DIAGNOSIS — Z5111 Encounter for antineoplastic chemotherapy: Secondary | ICD-10-CM

## 2013-04-23 LAB — COMPREHENSIVE METABOLIC PANEL (CC13)
Albumin: 3.5 g/dL (ref 3.5–5.0)
BUN: 16.7 mg/dL (ref 7.0–26.0)
CO2: 27 mEq/L (ref 22–29)
Calcium: 10.2 mg/dL (ref 8.4–10.4)
Chloride: 100 mEq/L (ref 98–109)
Glucose: 87 mg/dl (ref 70–140)
Potassium: 4.3 mEq/L (ref 3.5–5.1)
Sodium: 135 mEq/L — ABNORMAL LOW (ref 136–145)
Total Protein: 7.1 g/dL (ref 6.4–8.3)

## 2013-04-23 LAB — CBC WITH DIFFERENTIAL/PLATELET
Basophils Absolute: 0 10*3/uL (ref 0.0–0.1)
EOS%: 2.1 % (ref 0.0–7.0)
Eosinophils Absolute: 0.1 10*3/uL (ref 0.0–0.5)
HCT: 32.1 % — ABNORMAL LOW (ref 34.8–46.6)
HGB: 10 g/dL — ABNORMAL LOW (ref 11.6–15.9)
MCH: 24.3 pg — ABNORMAL LOW (ref 25.1–34.0)
MONO#: 0.3 10*3/uL (ref 0.1–0.9)
NEUT#: 3.6 10*3/uL (ref 1.5–6.5)
NEUT%: 69 % (ref 38.4–76.8)
RDW: 15.5 % — ABNORMAL HIGH (ref 11.2–14.5)
lymph#: 1.1 10*3/uL (ref 0.9–3.3)

## 2013-04-23 MED ORDER — ONDANSETRON 16 MG/50ML IVPB (CHCC)
16.0000 mg | Freq: Once | INTRAVENOUS | Status: AC
  Administered 2013-04-23: 16 mg via INTRAVENOUS

## 2013-04-23 MED ORDER — SODIUM CHLORIDE 0.9 % IV SOLN
148.2000 mg | Freq: Once | INTRAVENOUS | Status: AC
  Administered 2013-04-23: 150 mg via INTRAVENOUS
  Filled 2013-04-23: qty 15

## 2013-04-23 MED ORDER — SODIUM CHLORIDE 0.9 % IV SOLN
Freq: Once | INTRAVENOUS | Status: AC
  Administered 2013-04-23: 12:00:00 via INTRAVENOUS

## 2013-04-23 MED ORDER — DEXAMETHASONE SODIUM PHOSPHATE 20 MG/5ML IJ SOLN
20.0000 mg | Freq: Once | INTRAMUSCULAR | Status: AC
  Administered 2013-04-23: 20 mg via INTRAVENOUS

## 2013-04-23 MED ORDER — FAMOTIDINE IN NACL 20-0.9 MG/50ML-% IV SOLN
20.0000 mg | Freq: Once | INTRAVENOUS | Status: AC
  Administered 2013-04-23: 20 mg via INTRAVENOUS

## 2013-04-23 MED ORDER — SODIUM CHLORIDE 0.9 % IV SOLN
45.0000 mg/m2 | Freq: Once | INTRAVENOUS | Status: AC
  Administered 2013-04-23: 66 mg via INTRAVENOUS
  Filled 2013-04-23: qty 11

## 2013-04-23 MED ORDER — PACLITAXEL CHEMO INJECTION 300 MG/50ML: 45.0000 mg/m2 | Freq: Once | INTRAVENOUS | Status: DC

## 2013-04-23 MED ORDER — DIPHENHYDRAMINE HCL 50 MG/ML IJ SOLN
50.0000 mg | Freq: Once | INTRAMUSCULAR | Status: AC
  Administered 2013-04-23: 50 mg via INTRAVENOUS

## 2013-04-23 NOTE — Patient Instructions (Addendum)
[facility] Discharge Instructions for Patients Receiving Chemotherapy  Today you received the following chemotherapy agents :  Taxol, Carboplatin.  To help prevent nausea and vomiting after your treatment, we encourage you to take your nausea medication as instructed by your physician.   If you develop nausea and vomiting that is not controlled by your nausea medication, call the clinic.   BELOW ARE SYMPTOMS THAT SHOULD BE REPORTED IMMEDIATELY:  *FEVER GREATER THAN 100.5 F  *CHILLS WITH OR WITHOUT FEVER  NAUSEA AND VOMITING THAT IS NOT CONTROLLED WITH YOUR NAUSEA MEDICATION  *UNUSUAL SHORTNESS OF BREATH  *UNUSUAL BRUISING OR BLEEDING  TENDERNESS IN MOUTH AND THROAT WITH OR WITHOUT PRESENCE OF ULCERS  *URINARY PROBLEMS  *BOWEL PROBLEMS  UNUSUAL RASH Items with * indicate a potential emergency and should be followed up as soon as possible.  Feel free to call the clinic you have any questions or concerns. The clinic phone number is [phone].    

## 2013-04-23 NOTE — Progress Notes (Signed)
Patient presented to the nurses station requesting bp check. Patient reports that she felt dizzy after sitting up on treatment table. Vitals stable. No dehydration noted. Patient confirms she is drinking plenty of fluid. Denies painful or difficulty swallowing. Reports that she only had 1/4 of can of boost for breakfast. Encouraged patient to eat a more substantial breakfast tomorrow morning. Denies nausea, vomiting, or headache. Patient refused to see physician today. Encouraged patient to contact staff with future needs and she verbalized understanding.

## 2013-04-24 ENCOUNTER — Ambulatory Visit
Admission: RE | Admit: 2013-04-24 | Discharge: 2013-04-24 | Disposition: A | Discharge status: Home / Self Care | Payer: Medicare Other | Source: Ambulatory Visit | Attending: Radiation Oncology | Admitting: Radiation Oncology

## 2013-04-25 ENCOUNTER — Ambulatory Visit
Admission: RE | Admit: 2013-04-25 | Discharge: 2013-04-25 | Disposition: A | Discharge status: Home / Self Care | Payer: Medicare Other | Source: Ambulatory Visit | Attending: Radiation Oncology | Admitting: Radiation Oncology

## 2013-04-26 ENCOUNTER — Ambulatory Visit
Admission: RE | Admit: 2013-04-26 | Discharge: 2013-04-26 | Disposition: A | Discharge status: Home / Self Care | Payer: Medicare Other | Source: Ambulatory Visit | Attending: Radiation Oncology | Admitting: Radiation Oncology

## 2013-04-26 ENCOUNTER — Telehealth: Payer: Self-pay | Admitting: Internal Medicine

## 2013-04-26 NOTE — Telephone Encounter (Signed)
Per MM due to AJ @ [facility] 7/14 move appts to another APP. Moved 7/14 appt to [facility] and lb/tx moved accordingly. lmonvm for pt dtr Ocie Cornfield re change w/new time for 7/14 lb/fu/tx @ 8:45am. Also informed Selena Batten that appt in Dr. Broadus John office for 7/14 remains the same (xrt 8:30am). Added comment to 7/11 xrt appt to send pt/dtr to [facility] scheduling for new schedule. Per comment on appt desk call dtr w/appts.

## 2013-04-27 ENCOUNTER — Encounter: Payer: Self-pay | Admitting: Radiation Oncology

## 2013-04-27 ENCOUNTER — Ambulatory Visit
Admission: RE | Admit: 2013-04-27 | Discharge: 2013-04-27 | Disposition: A | Discharge status: Home / Self Care | Payer: Medicare Other | Source: Ambulatory Visit | Attending: Radiation Oncology | Admitting: Radiation Oncology

## 2013-04-27 NOTE — Progress Notes (Signed)
Denies cough. Denies shortness of breath. Denies pain. Reports fatigue. Weight stable. Denies painful or difficult swallow. Denies skin changes to chest and back. Reports using radiaplex bid as directed.

## 2013-04-27 NOTE — Progress Notes (Signed)
  Radiation Oncology         [phone]) [phone] ________________________________  Name: Chelsea Cruz MRN: [id number]  Date: 04/27/2013  DOB: 11/22/1940  Weekly Radiation Therapy Management  Current Dose: 28 Gy     Planned Dose:  66 Gy  Narrative . . . . . . . . The patient presents for routine under treatment assessment.                                                  Denies cough. Denies shortness of breath. Denies pain. Reports fatigue. Weight stable. Denies painful or difficult swallow. Denies skin changes to chest and back. Reports using radiaplex bid as directed                                 Set-up films were reviewed.                                 The chart was checked. Physical Findings. . .  weight is 107 lb 9.6 oz (48.807 kg). Her blood pressure is 129/76 and her pulse is 101. Her respiration is 16 and oxygen saturation is 97%. . Weight essentially stable.  No significant changes. Impression . . . . . . . The patient is  tolerating radiation. Plan . . . . . . . . . . . . Continue treatment as planned.  ________________________________  [name]. Kathrynn Running, M.D.

## 2013-04-30 ENCOUNTER — Telehealth: Payer: Self-pay | Admitting: *Deleted

## 2013-04-30 ENCOUNTER — Other Ambulatory Visit: Payer: Medicare Other | Admitting: Lab

## 2013-04-30 ENCOUNTER — Ambulatory Visit (HOSPITAL_BASED_OUTPATIENT_CLINIC_OR_DEPARTMENT_OTHER): Payer: Medicare Other | Admitting: Family

## 2013-04-30 ENCOUNTER — Encounter: Payer: Self-pay | Admitting: Family

## 2013-04-30 ENCOUNTER — Ambulatory Visit
Admission: RE | Admit: 2013-04-30 | Discharge: 2013-04-30 | Disposition: A | Discharge status: Home / Self Care | Payer: Medicare Other | Source: Ambulatory Visit | Attending: Radiation Oncology | Admitting: Radiation Oncology

## 2013-04-30 ENCOUNTER — Ambulatory Visit (HOSPITAL_BASED_OUTPATIENT_CLINIC_OR_DEPARTMENT_OTHER): Payer: Medicare Other

## 2013-04-30 ENCOUNTER — Other Ambulatory Visit (HOSPITAL_BASED_OUTPATIENT_CLINIC_OR_DEPARTMENT_OTHER): Payer: Medicare Other | Admitting: Lab

## 2013-04-30 DIAGNOSIS — C3410 Malignant neoplasm of upper lobe, unspecified bronchus or lung: Secondary | ICD-10-CM

## 2013-04-30 DIAGNOSIS — C3492 Malignant neoplasm of unspecified part of left bronchus or lung: Secondary | ICD-10-CM

## 2013-04-30 DIAGNOSIS — Z5111 Encounter for antineoplastic chemotherapy: Secondary | ICD-10-CM

## 2013-04-30 LAB — CBC WITH DIFFERENTIAL/PLATELET
BASO%: 1.6 % (ref 0.0–2.0)
EOS%: 2.5 % (ref 0.0–7.0)
HCT: 32.7 % — ABNORMAL LOW (ref 34.8–46.6)
MCH: 25.2 pg (ref 25.1–34.0)
MCHC: 31.8 g/dL (ref 31.5–36.0)
MONO#: 0.3 10*3/uL (ref 0.1–0.9)
RBC: 4.13 10*6/uL (ref 3.70–5.45)
RDW: 17.4 % — ABNORMAL HIGH (ref 11.2–14.5)
WBC: 4.9 10*3/uL (ref 3.9–10.3)
lymph#: 1.2 10*3/uL (ref 0.9–3.3)

## 2013-04-30 LAB — COMPREHENSIVE METABOLIC PANEL (CC13)
ALT: 23 U/L (ref 0–55)
AST: 16 U/L (ref 5–34)
CO2: 23 mEq/L (ref 22–29)
Calcium: 9.6 mg/dL (ref 8.4–10.4)
Chloride: 102 mEq/L (ref 98–109)
Potassium: 4.3 mEq/L (ref 3.5–5.1)
Sodium: 135 mEq/L — ABNORMAL LOW (ref 136–145)
Total Protein: 6.7 g/dL (ref 6.4–8.3)

## 2013-04-30 MED ORDER — DIPHENHYDRAMINE HCL 50 MG/ML IJ SOLN
50.0000 mg | Freq: Once | INTRAMUSCULAR | Status: AC
  Administered 2013-04-30: 50 mg via INTRAVENOUS

## 2013-04-30 MED ORDER — FAMOTIDINE IN NACL 20-0.9 MG/50ML-% IV SOLN: 20.0000 mg | Freq: Once | INTRAVENOUS | Status: DC

## 2013-04-30 MED ORDER — DEXAMETHASONE SODIUM PHOSPHATE 20 MG/5ML IJ SOLN
20.0000 mg | Freq: Once | INTRAMUSCULAR | Status: AC
  Administered 2013-04-30: 20 mg via INTRAVENOUS

## 2013-04-30 MED ORDER — ONDANSETRON 16 MG/50ML IVPB (CHCC)
16.0000 mg | Freq: Once | INTRAVENOUS | Status: AC
  Administered 2013-04-30: 16 mg via INTRAVENOUS

## 2013-04-30 MED ORDER — SODIUM CHLORIDE 0.9 % IV SOLN
50.0000 mg | Freq: Once | INTRAVENOUS | Status: AC
  Administered 2013-04-30: 50 mg via INTRAVENOUS
  Filled 2013-04-30: qty 2

## 2013-04-30 MED ORDER — SODIUM CHLORIDE 0.9 % IV SOLN
148.2000 mg | Freq: Once | INTRAVENOUS | Status: AC
  Administered 2013-04-30: 150 mg via INTRAVENOUS
  Filled 2013-04-30: qty 15

## 2013-04-30 MED ORDER — SODIUM CHLORIDE 0.9 % IV SOLN
Freq: Once | INTRAVENOUS | Status: AC
  Administered 2013-04-30: 11:00:00 via INTRAVENOUS

## 2013-04-30 MED ORDER — SODIUM CHLORIDE 0.9 % IV SOLN
45.0000 mg/m2 | Freq: Once | INTRAVENOUS | Status: AC
  Administered 2013-04-30: 66 mg via INTRAVENOUS
  Filled 2013-04-30: qty 11

## 2013-04-30 NOTE — Telephone Encounter (Signed)
appts made and printed. Pt is aware that her tx will be added at a later time. i emailed MW to add the tx.Marland Kitchentd

## 2013-04-30 NOTE — Progress Notes (Addendum)
[facility] Telephone:(336) [phone]   [phone]) [phone] OFFICE PROGRESS NOTE   Patient:Chelsea Cruz  DOB:1941/09/27 MRN: [id number]  CSN: [id number]   PCP: Gweneth Dimitri, MD [address] [place] [address]   DIAGNOSIS: Stage IIB (T3, N0, M0) non-small cell lung cancer, poorly differentiated squamous cell carcinoma diagnosed in June of 2014.   PRIOR THERAPY: None   CURRENT THERAPY: Concurrent chemoradiation with weekly carboplatin for AUC of 2 and paclitaxel 45 mg/M2. Status post 3 cycles. First cycle was given on 04/09/2013.   INTERVAL HISTORY: Chelsea Cruz is a 72 y.o. female who returns to the clinic today for followup visit.  The patient is accompanied by her niece Kriste Basque for today's office visit.  The patient is tolerating her treatments fairly well with no significant adverse effects. She denies having any significant chest pain, cough or hemoptysis. She has had no nausea or vomiting. She has no fever or chills. She reports mild SOB when showering and fatigue.  She denies any other symptomatology.   MEDICAL HISTORY: Past Medical History  Diagnosis Date  . Nodule of left lung   . Hypertension   . Nicotine addiction   . Osteopenia   . Osteomalacia   . Impaired fasting glucose   . Hypercholesteremia   . Depression   . Lung cancer     ALLERGIES:  is allergic to penicillins.  MEDICATIONS:  Current Outpatient Prescriptions  Medication Sig Dispense Refill  . alendronate (FOSAMAX) 70 MG tablet Take 70 mg by mouth every 7 (seven) days.       Marland Kitchen amLODipine (NORVASC) 2.5 MG tablet 2.5 mg daily.       . cholecalciferol (VITAMIN D) 1000 UNITS tablet Take 3,000 Units by mouth daily. Takes 3 capsules      . COMBIVENT RESPIMAT 20-100 MCG/ACT AERS respimat 1 puff every 6 (six) hours as needed for wheezing.       Marland Kitchen lisinopril (PRINIVIL,ZESTRIL) 10 MG tablet Take 10 mg by mouth daily.       . prochlorperazine (COMPAZINE) 10 MG tablet Take 1 tablet (10  mg total) by mouth every 6 (six) hours as needed (for nausea and vomiting).  30 tablet  1  . Wound Cleansers (RADIAPLEX EX) Apply topically.       No current facility-administered medications for this visit.    SURGICAL HISTORY:  Past Surgical History  Procedure Laterality Date  . Cholecystectomy    . Right oophorectomy      1969    REVIEW OF SYSTEMS:  A 10 point review of systems was completed and is negative except as noted above.   PHYSICAL EXAMINATION: General appearance: Alert, cooperative, thin frame, no apparent distress Head: Normocephalic, without obvious abnormality, atraumatic Eyes: Arcus senilis, PERRLA, EOMI Nose: Nares, septum and mucosa are normal, no drainage or sinus tenderness Neck: No adenopathy, supple, symmetrical, trachea midline, thyroid not enlarged, no tenderness Resp: Clear to auscultation bilaterally, diminished breath sounds throughout, diminished bibasilar breath sounds Cardio: Regular rate and rhythm, S1, S2 normal, no murmur, click, rub or gallop GI: Soft, not distended, non-tender, hypoactive bowel sounds, no organomegaly Extremities: Extremities normal, atraumatic, no cyanosis or edema, bilateral lower extremity varicose veins Lymph nodes: Cervical, supraclavicular, and axillary nodes normal Neurologic: Grossly normal  ECOG PERFORMANCE STATUS: 1 - Symptomatic but completely ambulatory  Blood pressure 148/72, pulse 103, temperature 98.4 F (36.9 C), temperature source Oral, resp. rate 20, height 5\' 1"  (1.549 m), weight 107 lb (48.535 kg). O2  sats 97% on RA  LABORATORY DATA: Lab Results  Component Value Date   WBC 4.9 04/30/2013   HGB 10.4* 04/30/2013   HCT 32.7* 04/30/2013   MCV 79.2* 04/30/2013   PLT 289 04/30/2013      Chemistry      Component Value Date/Time   NA 135* 04/30/2013 0859   K 4.3 04/30/2013 0859   CL 98 04/09/2013 1114   CO2 23 04/30/2013 0859   BUN 14.0 04/30/2013 0859   CREATININE 0.7 04/30/2013 0859   CREATININE 0.79  03/16/2013 1500      Component Value Date/Time   CALCIUM 9.6 04/30/2013 0859   ALKPHOS 66 04/30/2013 0859   AST 16 04/30/2013 0859   ALT 23 04/30/2013 0859   BILITOT 0.80 04/30/2013 0859       RADIOGRAPHIC STUDIES: No results found.   ASSESSMENT AND PLAN: [name] is a very pleasant 72 years old female with a stage IIB non-small cell lung cancer currently undergoing concurrent chemoradiation with weekly carboplatin and paclitaxel and is status post 3 weekly treatments. The patient is tolerating her treatments fairly well with no significant adverse effects.  Dr. Arbutus Ped and I  recommended for her to proceed with cycle #4 today as scheduled. She would come back for followup visit in 2 weeks (on 05/14/2013) for evaluation and management any adverse effect of her treatment. She was again advised to call immediately if she has any concerning symptoms in the interim.  All questions were answered.  Ms. Kaman knows to call the clinic with any problems, questions or concerns. We can certainly see her  much sooner if necessary.  Larina Bras, NP-C 04/30/2013 3:01 PM  ADDENDUM: Hematology/oncology attending: I have the face to face encounter with the patient today. I recommended her care plan. She has a stage IIB non-small cell lung cancer currently undergoing concurrent chemoradiation with weekly carboplatin and paclitaxel is status post 3 cycles. The patient is tolerating her treatment fairly well with no significant adverse effects. We'll proceed with cycle #4 today as scheduled. The patient would come back for follow up visit in 2 weeks for reevaluation and management any adverse effect of her treatment. Lajuana Matte., MD 04/30/2013

## 2013-04-30 NOTE — Patient Instructions (Signed)
[facility] Discharge Instructions for Patients Receiving Chemotherapy  Today you received the following chemotherapy agents:  Taxol/carboplatin  To help prevent nausea and vomiting after your treatment, we encourage you to take your nausea medication.  Take it as often as prescribed.     If you develop nausea and vomiting that is not controlled by your nausea medication, call the clinic. If it is after clinic hours your family physician or the after hours number for the clinic or go to the Emergency Department.   BELOW ARE SYMPTOMS THAT SHOULD BE REPORTED IMMEDIATELY:  *FEVER GREATER THAN 100.5 F  *CHILLS WITH OR WITHOUT FEVER  NAUSEA AND VOMITING THAT IS NOT CONTROLLED WITH YOUR NAUSEA MEDICATION  *UNUSUAL SHORTNESS OF BREATH  *UNUSUAL BRUISING OR BLEEDING  TENDERNESS IN MOUTH AND THROAT WITH OR WITHOUT PRESENCE OF ULCERS  *URINARY PROBLEMS  *BOWEL PROBLEMS  UNUSUAL RASH Items with * indicate a potential emergency and should be followed up as soon as possible.  Feel free to call the clinic you have any questions or concerns. The clinic phone number is [phone].   I have been informed and understand all the instructions given to me. I know to contact the clinic, my physician, or go to the Emergency Department if any problems should occur. I do not have any questions at this time, but understand that I may call the clinic during office hours   should I have any questions or need assistance in obtaining follow up care.    __________________________________________  _____________  __________ Signature of Patient or Authorized Representative            Date                   Time    __________________________________________ Nurse's Signature

## 2013-04-30 NOTE — Telephone Encounter (Signed)
Per staff message and POF I have scheduled appts.  JMW  

## 2013-04-30 NOTE — Patient Instructions (Addendum)
Please contact us at [phone] if you have any questions or concerns.  Please continue to do well and enjoy life!!!  Get plenty of rest, drink plenty of water, exercise daily (walking), eat a balanced diet.  Take all medications as prescribed.  Check blood pressure daily.

## 2013-05-01 ENCOUNTER — Ambulatory Visit
Admission: RE | Admit: 2013-05-01 | Discharge: 2013-05-01 | Disposition: A | Discharge status: Home / Self Care | Payer: Medicare Other | Source: Ambulatory Visit | Attending: Radiation Oncology | Admitting: Radiation Oncology

## 2013-05-02 ENCOUNTER — Ambulatory Visit
Admission: RE | Admit: 2013-05-02 | Discharge: 2013-05-02 | Disposition: A | Discharge status: Home / Self Care | Payer: Medicare Other | Source: Ambulatory Visit | Attending: Radiation Oncology | Admitting: Radiation Oncology

## 2013-05-03 ENCOUNTER — Ambulatory Visit
Admission: RE | Admit: 2013-05-03 | Discharge: 2013-05-03 | Disposition: A | Discharge status: Home / Self Care | Payer: Medicare Other | Source: Ambulatory Visit | Attending: Radiation Oncology | Admitting: Radiation Oncology

## 2013-05-03 ENCOUNTER — Encounter: Payer: Self-pay | Admitting: Radiation Oncology

## 2013-05-03 NOTE — Progress Notes (Signed)
Reports mild fatigue. Reports shortness of breath only with exertion. Denies difficulty or painful swallowing. No hyperpigmentation of chest or back noted. Reports back is occasionally dry and itches some. Reports using radiaplex bid as directed.

## 2013-05-03 NOTE — Progress Notes (Signed)
  Radiation Oncology         (336) [phone] ________________________________  Name: Chelsea Cruz  MRN: [id number]  Date: 05/03/2013  DOB: [birth year]  Weekly Radiation Therapy Management  Current Dose: 36 Gy     Planned Dose:  66 Gy  Narrative . . . . . . . . The patient presents for routine under treatment assessment.                                                      The patient is without complaint.                                 Set-up films were reviewed.                                 The chart was checked. Physical Findings. . .  weight is 108 lb 8 oz (49.215 kg). Her oral temperature is 97.4 F (36.3 C). Her blood pressure is 122/52 and her pulse is 92. Her respiration is 16 and oxygen saturation is 98%. . Weight essentially stable.  No significant changes. Impression . . . . . . . The patient is tolerating radiation. Plan . . . . . . . . . . . . Continue treatment as planned.  ________________________________  [name]. Kathrynn Running, M.D.

## 2013-05-04 ENCOUNTER — Telehealth: Payer: Self-pay | Admitting: *Deleted

## 2013-05-04 ENCOUNTER — Ambulatory Visit
Admission: RE | Admit: 2013-05-04 | Discharge: 2013-05-04 | Disposition: A | Discharge status: Home / Self Care | Payer: Medicare Other | Source: Ambulatory Visit | Attending: Radiation Oncology | Admitting: Radiation Oncology

## 2013-05-04 NOTE — Telephone Encounter (Signed)
Pt called c/o diarrhea and wants to know what to take.  Educated pt regarding imodium and staying hydrated.  Pt verbalized understanding.  SLJ

## 2013-05-07 ENCOUNTER — Other Ambulatory Visit (HOSPITAL_BASED_OUTPATIENT_CLINIC_OR_DEPARTMENT_OTHER): Payer: Medicare Other | Admitting: Lab

## 2013-05-07 ENCOUNTER — Ambulatory Visit
Admission: RE | Admit: 2013-05-07 | Discharge: 2013-05-07 | Disposition: A | Discharge status: Home / Self Care | Payer: Medicare Other | Source: Ambulatory Visit | Attending: Radiation Oncology | Admitting: Radiation Oncology

## 2013-05-07 ENCOUNTER — Ambulatory Visit (HOSPITAL_BASED_OUTPATIENT_CLINIC_OR_DEPARTMENT_OTHER): Payer: Medicare Other

## 2013-05-07 DIAGNOSIS — C3492 Malignant neoplasm of unspecified part of left bronchus or lung: Secondary | ICD-10-CM

## 2013-05-07 DIAGNOSIS — C3410 Malignant neoplasm of upper lobe, unspecified bronchus or lung: Secondary | ICD-10-CM

## 2013-05-07 DIAGNOSIS — Z5111 Encounter for antineoplastic chemotherapy: Secondary | ICD-10-CM

## 2013-05-07 LAB — CBC WITH DIFFERENTIAL/PLATELET
Basophils Absolute: 0 10*3/uL (ref 0.0–0.1)
Eosinophils Absolute: 0.1 10*3/uL (ref 0.0–0.5)
HCT: 31.1 % — ABNORMAL LOW (ref 34.8–46.6)
HGB: 9.6 g/dL — ABNORMAL LOW (ref 11.6–15.9)
LYMPH%: 20.2 % (ref 14.0–49.7)
MCV: 81 fL (ref 79.5–101.0)
MONO#: 0.3 10*3/uL (ref 0.1–0.9)
NEUT#: 3.2 10*3/uL (ref 1.5–6.5)
Platelets: 153 10*3/uL (ref 145–400)
RBC: 3.84 10*6/uL (ref 3.70–5.45)
WBC: 4.6 10*3/uL (ref 3.9–10.3)
nRBC: 0 % (ref 0–0)

## 2013-05-07 LAB — COMPREHENSIVE METABOLIC PANEL (CC13)
ALT: 27 U/L (ref 0–55)
CO2: 24 mEq/L (ref 22–29)
Calcium: 10.1 mg/dL (ref 8.4–10.4)
Chloride: 104 mEq/L (ref 98–109)
Creatinine: 0.7 mg/dL (ref 0.6–1.1)
Glucose: 68 mg/dl — ABNORMAL LOW (ref 70–140)
Total Protein: 6.8 g/dL (ref 6.4–8.3)

## 2013-05-07 MED ORDER — ONDANSETRON 16 MG/50ML IVPB (CHCC)
16.0000 mg | Freq: Once | INTRAVENOUS | Status: AC
  Administered 2013-05-07: 16 mg via INTRAVENOUS

## 2013-05-07 MED ORDER — CARBOPLATIN CHEMO INJECTION 450 MG/45ML
148.2000 mg | Freq: Once | INTRAVENOUS | Status: AC
  Administered 2013-05-07: 150 mg via INTRAVENOUS
  Filled 2013-05-07: qty 15

## 2013-05-07 MED ORDER — FAMOTIDINE IN NACL 20-0.9 MG/50ML-% IV SOLN: 20.0000 mg | Freq: Once | INTRAVENOUS | Status: DC

## 2013-05-07 MED ORDER — PACLITAXEL CHEMO INJECTION 300 MG/50ML
45.0000 mg/m2 | Freq: Once | INTRAVENOUS | Status: AC
  Administered 2013-05-07: 66 mg via INTRAVENOUS
  Filled 2013-05-07: qty 11

## 2013-05-07 MED ORDER — SODIUM CHLORIDE 0.9 % IV SOLN
Freq: Once | INTRAVENOUS | Status: AC
  Administered 2013-05-07: 10:00:00 via INTRAVENOUS

## 2013-05-07 MED ORDER — DEXAMETHASONE SODIUM PHOSPHATE 20 MG/5ML IJ SOLN
20.0000 mg | Freq: Once | INTRAMUSCULAR | Status: AC
  Administered 2013-05-07: 20 mg via INTRAVENOUS

## 2013-05-07 MED ORDER — SODIUM CHLORIDE 0.9 % IV SOLN
50.0000 mg | Freq: Once | INTRAVENOUS | Status: AC
  Administered 2013-05-07: 50 mg via INTRAVENOUS
  Filled 2013-05-07: qty 2

## 2013-05-07 MED ORDER — DIPHENHYDRAMINE HCL 50 MG/ML IJ SOLN
50.0000 mg | Freq: Once | INTRAMUSCULAR | Status: AC
Start: 2013-05-07 — End: 2013-05-07
  Administered 2013-05-07: 50 mg via INTRAVENOUS

## 2013-05-07 NOTE — Patient Instructions (Addendum)
[facility] Discharge Instructions for Patients Receiving Chemotherapy  Today you received the following chemotherapy agents Taxol/Carboplatin To help prevent nausea and vomiting after your treatment, we encourage you to take your nausea medication as prescribed.  If you develop nausea and vomiting that is not controlled by your nausea medication, call the clinic.   BELOW ARE SYMPTOMS THAT SHOULD BE REPORTED IMMEDIATELY:  *FEVER GREATER THAN 100.5 F  *CHILLS WITH OR WITHOUT FEVER  NAUSEA AND VOMITING THAT IS NOT CONTROLLED WITH YOUR NAUSEA MEDICATION  *UNUSUAL SHORTNESS OF BREATH  *UNUSUAL BRUISING OR BLEEDING  TENDERNESS IN MOUTH AND THROAT WITH OR WITHOUT PRESENCE OF ULCERS  *URINARY PROBLEMS  *BOWEL PROBLEMS  UNUSUAL RASH Items with * indicate a potential emergency and should be followed up as soon as possible.  Feel free to call the clinic you have any questions or concerns. The clinic phone number is [phone].    

## 2013-05-08 ENCOUNTER — Ambulatory Visit
Admission: RE | Admit: 2013-05-08 | Discharge: 2013-05-08 | Disposition: A | Discharge status: Home / Self Care | Payer: Medicare Other | Source: Ambulatory Visit | Attending: Radiation Oncology | Admitting: Radiation Oncology

## 2013-05-09 ENCOUNTER — Ambulatory Visit
Admission: RE | Admit: 2013-05-09 | Discharge: 2013-05-09 | Disposition: A | Discharge status: Home / Self Care | Payer: Medicare Other | Source: Ambulatory Visit | Attending: Radiation Oncology | Admitting: Radiation Oncology

## 2013-05-10 ENCOUNTER — Encounter: Payer: Self-pay | Admitting: Radiation Oncology

## 2013-05-10 ENCOUNTER — Ambulatory Visit
Admission: RE | Admit: 2013-05-10 | Discharge: 2013-05-10 | Disposition: A | Discharge status: Home / Self Care | Payer: Medicare Other | Source: Ambulatory Visit | Attending: Radiation Oncology | Admitting: Radiation Oncology

## 2013-05-10 NOTE — Progress Notes (Addendum)
  Radiation Oncology         (336) [phone] ________________________________  Name: Chelsea Cruz MRN: [id number]  Date: 05/11/2013  DOB: October 04, 1941  Weekly Radiation Therapy Management  Current Dose: 48 Gy     Planned Dose:  66 Gy  Narrative . . . . . . . . The patient presents for routine under treatment assessment.                                                      The patient is without complaint.                                 Set-up films were reviewed. Her tumor has regressed significantly on treatment imaging: Planning Scan   Today's scan showing cavitation at the tumor site                                   The chart was checked. Physical Findings. . .  weight is 111 lb 8 oz (50.576 kg). Her blood pressure is 137/76 and her pulse is 89. Her respiration is 16 and oxygen saturation is 99%. . Weight essentially stable.  No significant changes. Impression . . . . . . . The patient is  tolerating radiation. Plan . . . . . . . . . . . . Continue treatment as planned.  ________________________________  [name]. Kathrynn Running, M.D.

## 2013-05-11 ENCOUNTER — Ambulatory Visit
Admission: RE | Admit: 2013-05-11 | Discharge: 2013-05-11 | Disposition: A | Discharge status: Home / Self Care | Payer: Medicare Other | Source: Ambulatory Visit | Attending: Radiation Oncology | Admitting: Radiation Oncology

## 2013-05-11 ENCOUNTER — Encounter: Payer: Self-pay | Admitting: Radiation Oncology

## 2013-05-11 MED ORDER — BIAFINE EX EMUL
Freq: Every day | CUTANEOUS | Status: DC
  Administered 2013-05-11: 12:00:00 via TOPICAL

## 2013-05-11 NOTE — Progress Notes (Signed)
Denies cough or shortness of breath. Reports difficulty swallowing has resolved. Denies pain associated with swallowing. Left upper back radiation dermatitis noted. Patient reports this is very itchy. Provided patient with biafine to replace radiaplex. Reports fatigue.

## 2013-05-11 NOTE — Addendum Note (Signed)
Encounter addended by: Agnes Lawrence, RN on: 05/11/2013 12:06 PM<BR>     Documentation filed: Inpatient MAR, Orders

## 2013-05-14 ENCOUNTER — Ambulatory Visit (HOSPITAL_BASED_OUTPATIENT_CLINIC_OR_DEPARTMENT_OTHER): Payer: Medicare Other | Admitting: Physician Assistant

## 2013-05-14 ENCOUNTER — Encounter: Payer: Self-pay | Admitting: Physician Assistant

## 2013-05-14 ENCOUNTER — Ambulatory Visit
Admission: RE | Admit: 2013-05-14 | Discharge: 2013-05-14 | Disposition: A | Discharge status: Home / Self Care | Payer: Medicare Other | Source: Ambulatory Visit | Attending: Radiation Oncology | Admitting: Radiation Oncology

## 2013-05-14 ENCOUNTER — Telehealth: Payer: Self-pay | Admitting: Internal Medicine

## 2013-05-14 ENCOUNTER — Other Ambulatory Visit: Payer: Medicare Other | Admitting: Lab

## 2013-05-14 ENCOUNTER — Ambulatory Visit (HOSPITAL_BASED_OUTPATIENT_CLINIC_OR_DEPARTMENT_OTHER): Payer: Medicare Other

## 2013-05-14 ENCOUNTER — Other Ambulatory Visit (HOSPITAL_BASED_OUTPATIENT_CLINIC_OR_DEPARTMENT_OTHER): Payer: Medicare Other | Admitting: Lab

## 2013-05-14 DIAGNOSIS — C3410 Malignant neoplasm of upper lobe, unspecified bronchus or lung: Secondary | ICD-10-CM

## 2013-05-14 DIAGNOSIS — Z5111 Encounter for antineoplastic chemotherapy: Secondary | ICD-10-CM

## 2013-05-14 DIAGNOSIS — C3492 Malignant neoplasm of unspecified part of left bronchus or lung: Secondary | ICD-10-CM

## 2013-05-14 LAB — CBC WITH DIFFERENTIAL/PLATELET
BASO%: 1.6 % (ref 0.0–2.0)
LYMPH%: 29.4 % (ref 14.0–49.7)
MCHC: 31.8 g/dL (ref 31.5–36.0)
MONO#: 0.2 10*3/uL (ref 0.1–0.9)
Platelets: 179 10*3/uL (ref 145–400)
RBC: 3.67 10*6/uL — ABNORMAL LOW (ref 3.70–5.45)
RDW: 20.9 % — ABNORMAL HIGH (ref 11.2–14.5)
WBC: 3.1 10*3/uL — ABNORMAL LOW (ref 3.9–10.3)
lymph#: 0.9 10*3/uL (ref 0.9–3.3)
nRBC: 0 % (ref 0–0)

## 2013-05-14 LAB — COMPREHENSIVE METABOLIC PANEL (CC13)
ALT: 18 U/L (ref 0–55)
AST: 18 U/L (ref 5–34)
Calcium: 9.9 mg/dL (ref 8.4–10.4)
Chloride: 104 mEq/L (ref 98–109)
Creatinine: 0.7 mg/dL (ref 0.6–1.1)

## 2013-05-14 MED ORDER — FAMOTIDINE IN NACL 20-0.9 MG/50ML-% IV SOLN
20.0000 mg | Freq: Once | INTRAVENOUS | Status: AC
  Administered 2013-05-14: 20 mg via INTRAVENOUS

## 2013-05-14 MED ORDER — DEXAMETHASONE SODIUM PHOSPHATE 20 MG/5ML IJ SOLN
20.0000 mg | Freq: Once | INTRAMUSCULAR | Status: AC
  Administered 2013-05-14: 20 mg via INTRAVENOUS

## 2013-05-14 MED ORDER — SODIUM CHLORIDE 0.9 % IV SOLN
148.2000 mg | Freq: Once | INTRAVENOUS | Status: AC
  Administered 2013-05-14: 150 mg via INTRAVENOUS
  Filled 2013-05-14: qty 15

## 2013-05-14 MED ORDER — DIPHENHYDRAMINE HCL 50 MG/ML IJ SOLN
50.0000 mg | Freq: Once | INTRAMUSCULAR | Status: AC
Start: 2013-05-14 — End: 2013-05-14
  Administered 2013-05-14: 50 mg via INTRAVENOUS

## 2013-05-14 MED ORDER — SODIUM CHLORIDE 0.9 % IV SOLN
Freq: Once | INTRAVENOUS | Status: AC
  Administered 2013-05-14: 11:00:00 via INTRAVENOUS

## 2013-05-14 MED ORDER — ONDANSETRON 16 MG/50ML IVPB (CHCC)
16.0000 mg | Freq: Once | INTRAVENOUS | Status: AC
  Administered 2013-05-14: 16 mg via INTRAVENOUS

## 2013-05-14 MED ORDER — PACLITAXEL CHEMO INJECTION 300 MG/50ML
45.0000 mg/m2 | Freq: Once | INTRAVENOUS | Status: AC
  Administered 2013-05-14: 66 mg via INTRAVENOUS
  Filled 2013-05-14: qty 11

## 2013-05-14 NOTE — Patient Instructions (Addendum)
Cancer Center Discharge Instructions for Patients Receiving Chemotherapy  Today you received the following chemotherapy agents Taxol and Carboplatin.  To help prevent nausea and vomiting after your treatment, we encourage you to take your nausea medication if needed.   If you develop nausea and vomiting that is not controlled by your nausea medication, call the clinic.   BELOW ARE SYMPTOMS THAT SHOULD BE REPORTED IMMEDIATELY:  *FEVER GREATER THAN 100.5 F  *CHILLS WITH OR WITHOUT FEVER  NAUSEA AND VOMITING THAT IS NOT CONTROLLED WITH YOUR NAUSEA MEDICATION  *UNUSUAL SHORTNESS OF BREATH  *UNUSUAL BRUISING OR BLEEDING  TENDERNESS IN MOUTH AND THROAT WITH OR WITHOUT PRESENCE OF ULCERS  *URINARY PROBLEMS  *BOWEL PROBLEMS  UNUSUAL RASH Items with * indicate a potential emergency and should be followed up as soon as possible.  Feel free to call the clinic you have any questions or concerns. The clinic phone number is [phone].    

## 2013-05-14 NOTE — Telephone Encounter (Signed)
Gave pt appt for lab, chemo and MD  for August and September 2014

## 2013-05-14 NOTE — Progress Notes (Signed)
[facility] Telephone:(336) [phone]   [phone]  OFFICE PROGRESS NOTE  MCNEILL,[name], MD [address] [place] [address]  DIAGNOSIS: Stage IIB (T3, N0, M0) non-small cell lung cancer, poorly differentiated squamous cell carcinoma diagnosed in June of 2014.  PRIOR THERAPY: None  CURRENT THERAPY: Concurrent chemoradiation with weekly carboplatin for AUC of 2 and paclitaxel 45 mg/M2. Status post 5 cycles. First cycle was given on 04/09/2013.   INTERVAL HISTORY: Chelsea Cruz 72 y.o. female returns to the clinic today for followup visit. The patient is tolerating the first week of her treatment fairly well with no significant adverse effects except for some diarrhea that occurs approximately 4 days after chemotherapy and is well controlled with Imodium. She denied having any significant chest pain, shortness breath, cough or hemoptysis. She has no nausea or vomiting. She has no fever or chills.  MEDICAL HISTORY: Past Medical History  Diagnosis Date  . Nodule of left lung   . Hypertension   . Nicotine addiction   . Osteopenia   . Osteomalacia   . Impaired fasting glucose   . Hypercholesteremia   . Depression   . Lung cancer     ALLERGIES:  is allergic to penicillins.  MEDICATIONS:  Current Outpatient Prescriptions  Medication Sig Dispense Refill  . alendronate (FOSAMAX) 70 MG tablet Take 70 mg by mouth every 7 (seven) days.       Marland Kitchen amLODipine (NORVASC) 2.5 MG tablet 2.5 mg daily.       . cholecalciferol (VITAMIN D) 1000 UNITS tablet Take 3,000 Units by mouth daily. Takes 3 capsules      . COMBIVENT RESPIMAT 20-100 MCG/ACT AERS respimat 1 puff every 6 (six) hours as needed for wheezing.       Marland Kitchen lisinopril (PRINIVIL,ZESTRIL) 10 MG tablet Take 10 mg by mouth daily.       . prochlorperazine (COMPAZINE) 10 MG tablet Take 1 tablet (10 mg total) by mouth every 6 (six) hours as needed (for nausea and vomiting).  30 tablet  1  . Wound Cleansers  (RADIAPLEX EX) Apply topically.       No current facility-administered medications for this visit.   Facility-Administered Medications Ordered in Other Visits  Medication Dose Route Frequency Provider Last Rate Last Dose  . CARBOplatin (PARAPLATIN) 150 mg in sodium chloride 0.9 % 100 mL chemo infusion  150 mg Intravenous Once Si Gaul, MD 230 mL/hr at 05/14/13 1259 150 mg at 05/14/13 1259    SURGICAL HISTORY:  Past Surgical History  Procedure Laterality Date  . Cholecystectomy    . Right oophorectomy      1969    REVIEW OF SYSTEMS:  A comprehensive review of systems was negative.   PHYSICAL EXAMINATION: General appearance: alert, cooperative and no distress Head: Normocephalic, without obvious abnormality, atraumatic Neck: no adenopathy Lymph nodes: Cervical, supraclavicular, and axillary nodes normal. Resp: clear to auscultation bilaterally Cardio: regular rate and rhythm, S1, S2 normal, no murmur, click, rub or gallop GI: soft, non-tender; bowel sounds normal; no masses,  no organomegaly Extremities: extremities normal, atraumatic, no cyanosis or edema  ECOG PERFORMANCE STATUS: 1 - Symptomatic but completely ambulatory  Blood pressure 115/65, pulse 98, temperature 97.4 F (36.3 C), resp. rate 20, height 5\' 1"  (1.549 m), weight 109 lb 4.8 oz (49.578 kg).  LABORATORY DATA: Lab Results  Component Value Date   WBC 3.1* 05/14/2013   HGB 9.5* 05/14/2013   HCT 29.9* 05/14/2013   MCV 81.5  05/14/2013   PLT 179 05/14/2013      Chemistry      Component Value Date/Time   NA 138 05/14/2013 0916   K 4.4 05/14/2013 0916   CL 98 04/09/2013 1114   CO2 24 05/14/2013 0916   BUN 14.4 05/14/2013 0916   CREATININE 0.7 05/14/2013 0916   CREATININE 0.79 03/16/2013 1500      Component Value Date/Time   CALCIUM 9.9 05/14/2013 0916   ALKPHOS 62 05/14/2013 0916   AST 18 05/14/2013 0916   ALT 18 05/14/2013 0916   BILITOT 1.11 05/14/2013 0916       RADIOGRAPHIC STUDIES: Dg Chest 1  View  03/26/2013   *RADIOLOGY REPORT*  Clinical Data: Left lung biopsy.  CHEST - 1 VIEW  Comparison: Chest x-ray 03/06/2013 and CT 03/06/2013.  CT biopsy earlier today.  Findings: The mass appears unchanged.  There is no pneumothorax. Slight left effusion  IMPRESSION: No pneumothorax post biopsy.   Original Report Authenticated By: Davonna Belling, M.D.   Ct Biopsy  03/26/2013   *RADIOLOGY REPORT*  Clinical history:72 year old with left lung mass.  PROCEDURE(S): CT GUIDED LEFT LUNG MASS BIOPSY  Physician: Rachelle Hora. Henn, MD  Medications:Versed 1.5 mg, Fentanyl 50 mcg. A radiology nurse monitored the patient for moderate sedation.  Moderate sedation time:11 minutes  Procedure:The procedure was explained to the patient.  The risks and benefits of the procedure were discussed and the patient's questions were addressed.  Informed consent was obtained from the patient.  The patient was placed supine on the CT scanner.  Images through the chest were obtained.  The left breast tissue was taped to the middle of the chest.  The left lateral chest was prepped and draped in a sterile fashion.  Skin was anesthetized with 1% lidocaine.  17 gauge guide needle was directed in the left lung mass with CT guidance.  Four core biopsies were obtained with an 18 gauge core device.  Findings:Large mass involving the left upper and lower lobes. Needle placement confirmed within the lesion.  No evidence for a pneumothorax following the core biopsies.  Complications: None  Impression:CT guided core biopsies of the left lung mass.   Original Report Authenticated By: Richarda Overlie, M.D.    ASSESSMENT AND PLAN: This is a very pleasant 72 years old white female with a stage IIb non-small cell lung cancer currently undergoing concurrent chemoradiation with weekly carboplatin and paclitaxel. She has radiation scheduled through 05/24/2013.  She  is tolerating her treatment fairly well with no significant adverse effects.Patient was discussed with Dr.  Arbutus Ped. She will complete her course of concurrent chemoradiation as scheduled. She'll followup with Dr. Arbutus Ped in early September 2014 with a restaging CT scan of her chest with contrast to reevaluate her disease.   Natalye Kott E, PA-C   She was advised to call immediately if she has any concerning symptoms in the interval. All questions were answered. The patient knows to call the clinic with any problems, questions or concerns. We can certainly see the patient much sooner if necessary.

## 2013-05-15 ENCOUNTER — Ambulatory Visit
Admission: RE | Admit: 2013-05-15 | Discharge: 2013-05-15 | Disposition: A | Discharge status: Home / Self Care | Payer: Medicare Other | Source: Ambulatory Visit | Attending: Radiation Oncology | Admitting: Radiation Oncology

## 2013-05-16 ENCOUNTER — Ambulatory Visit
Admission: RE | Admit: 2013-05-16 | Discharge: 2013-05-16 | Disposition: A | Discharge status: Home / Self Care | Payer: Medicare Other | Source: Ambulatory Visit | Attending: Radiation Oncology | Admitting: Radiation Oncology

## 2013-05-17 ENCOUNTER — Ambulatory Visit
Admission: RE | Admit: 2013-05-17 | Discharge: 2013-05-17 | Disposition: A | Discharge status: Home / Self Care | Payer: Medicare Other | Source: Ambulatory Visit | Attending: Radiation Oncology | Admitting: Radiation Oncology

## 2013-05-18 ENCOUNTER — Ambulatory Visit
Admission: RE | Admit: 2013-05-18 | Discharge: 2013-05-18 | Disposition: A | Discharge status: Home / Self Care | Payer: Medicare Other | Source: Ambulatory Visit | Attending: Radiation Oncology | Admitting: Radiation Oncology

## 2013-05-18 ENCOUNTER — Encounter: Payer: Self-pay | Admitting: Radiation Oncology

## 2013-05-18 NOTE — Progress Notes (Signed)
Hyperpigmentation with dry desquamation of left upper back noted. Patient denies skin changes to left upper chest. Reports using biafine as directed. Denies cough. Reports shortness of breath only with exertion. Denies painful or difficult swallowing. Reports mild fatigue.

## 2013-05-18 NOTE — Patient Instructions (Addendum)
Complete your course of concurrent chemoradiation as scheduled Follow up with Dr. Arbutus Ped approximately one month after finishing your course of concurrent chemoradiation with a restaging CT scan of your chest to re-evaluate your disease

## 2013-05-18 NOTE — Progress Notes (Signed)
   Department of Radiation Oncology  Phone:  [phone] Fax:        [phone]  Weekly Treatment Note    Name: Chelsea Cruz Date: 05/18/2013 MRN: [id number] DOB: Nov 23, 1940   Current dose: 58 Gy  Current fraction: 29   MEDICATIONS: Current Outpatient Prescriptions  Medication Sig Dispense Refill  . alendronate (FOSAMAX) 70 MG tablet Take 70 mg by mouth every 7 (seven) days.       . cholecalciferol (VITAMIN D) 1000 UNITS tablet Take 3,000 Units by mouth daily. Takes 3 capsules      . COMBIVENT RESPIMAT 20-100 MCG/ACT AERS respimat 1 puff every 6 (six) hours as needed for wheezing.       . prochlorperazine (COMPAZINE) 10 MG tablet Take 1 tablet (10 mg total) by mouth every 6 (six) hours as needed (for nausea and vomiting).  30 tablet  1  . Wound Cleansers (RADIAPLEX EX) Apply topically.      Marland Kitchen amLODipine (NORVASC) 2.5 MG tablet 2.5 mg daily.       Marland Kitchen lisinopril (PRINIVIL,ZESTRIL) 10 MG tablet Take 10 mg by mouth daily.        No current facility-administered medications for this encounter.     ALLERGIES: Penicillins   LABORATORY DATA:  Lab Results  Component Value Date   WBC 3.1* 05/14/2013   HGB 9.5* 05/14/2013   HCT 29.9* 05/14/2013   MCV 81.5 05/14/2013   PLT 179 05/14/2013   Lab Results  Component Value Date   NA 138 05/14/2013   K 4.4 05/14/2013   CL 98 04/09/2013   CO2 24 05/14/2013   Lab Results  Component Value Date   ALT 18 05/14/2013   AST 18 05/14/2013   ALKPHOS 62 05/14/2013   BILITOT 1.11 05/14/2013     NARRATIVE: Chelsea Cruz was seen today for weekly treatment management. The chart was checked and the patient's films were reviewed. The patient is doing very well. She denies any esophagitis at this time. The patient notes some skin irritation posteriorly.  PHYSICAL EXAMINATION: weight is 110 lb 8 oz (50.122 kg). Her blood pressure is 127/74 and her pulse is 105. Her respiration is 16 and oxygen saturation is 100%.      some radiation dermatitis is  present without any moist desquamation in the left posterior chest.  ASSESSMENT: The patient is doing satisfactorily with treatment.  PLAN: We will continue with the patient's radiation treatment as planned. The patient's skin is holding up okay right now. She will begin using some Neosporin.

## 2013-05-21 ENCOUNTER — Other Ambulatory Visit (HOSPITAL_BASED_OUTPATIENT_CLINIC_OR_DEPARTMENT_OTHER): Payer: Medicare Other | Admitting: Lab

## 2013-05-21 ENCOUNTER — Ambulatory Visit
Admission: RE | Admit: 2013-05-21 | Discharge: 2013-05-21 | Disposition: A | Discharge status: Home / Self Care | Payer: Medicare Other | Source: Ambulatory Visit | Attending: Radiation Oncology | Admitting: Radiation Oncology

## 2013-05-21 ENCOUNTER — Ambulatory Visit (HOSPITAL_BASED_OUTPATIENT_CLINIC_OR_DEPARTMENT_OTHER): Payer: Medicare Other

## 2013-05-21 DIAGNOSIS — C3410 Malignant neoplasm of upper lobe, unspecified bronchus or lung: Secondary | ICD-10-CM

## 2013-05-21 DIAGNOSIS — C3492 Malignant neoplasm of unspecified part of left bronchus or lung: Secondary | ICD-10-CM

## 2013-05-21 DIAGNOSIS — Z5111 Encounter for antineoplastic chemotherapy: Secondary | ICD-10-CM

## 2013-05-21 LAB — COMPREHENSIVE METABOLIC PANEL (CC13)
AST: 15 U/L (ref 5–34)
Alkaline Phosphatase: 63 U/L (ref 40–150)
BUN: 8.2 mg/dL (ref 7.0–26.0)
Glucose: 91 mg/dl (ref 70–140)
Sodium: 137 mEq/L (ref 136–145)
Total Bilirubin: 0.7 mg/dL (ref 0.20–1.20)
Total Protein: 6.7 g/dL (ref 6.4–8.3)

## 2013-05-21 LAB — CBC WITH DIFFERENTIAL/PLATELET
BASO%: 1.2 % (ref 0.0–2.0)
Basophils Absolute: 0 10*3/uL (ref 0.0–0.1)
EOS%: 1.6 % (ref 0.0–7.0)
HCT: 29.1 % — ABNORMAL LOW (ref 34.8–46.6)
MONO%: 10.7 % (ref 0.0–14.0)
NEUT#: 1.3 10*3/uL — ABNORMAL LOW (ref 1.5–6.5)
Platelets: 260 10*3/uL (ref 145–400)
RBC: 3.51 10*6/uL — ABNORMAL LOW (ref 3.70–5.45)
RDW: 22.9 % — ABNORMAL HIGH (ref 11.2–14.5)
WBC: 2.5 10*3/uL — ABNORMAL LOW (ref 3.9–10.3)

## 2013-05-21 MED ORDER — SODIUM CHLORIDE 0.9 % IV SOLN
150.0000 mg | Freq: Once | INTRAVENOUS | Status: AC
  Administered 2013-05-21: 150 mg via INTRAVENOUS
  Filled 2013-05-21: qty 15

## 2013-05-21 MED ORDER — PACLITAXEL CHEMO INJECTION 300 MG/50ML
45.0000 mg/m2 | Freq: Once | INTRAVENOUS | Status: AC
  Administered 2013-05-21: 66 mg via INTRAVENOUS
  Filled 2013-05-21: qty 11

## 2013-05-21 MED ORDER — ONDANSETRON 16 MG/50ML IVPB (CHCC)
16.0000 mg | Freq: Once | INTRAVENOUS | Status: AC
  Administered 2013-05-21: 16 mg via INTRAVENOUS

## 2013-05-21 MED ORDER — DEXAMETHASONE SODIUM PHOSPHATE 20 MG/5ML IJ SOLN
20.0000 mg | Freq: Once | INTRAMUSCULAR | Status: AC
  Administered 2013-05-21: 20 mg via INTRAVENOUS

## 2013-05-21 MED ORDER — DIPHENHYDRAMINE HCL 50 MG/ML IJ SOLN
50.0000 mg | Freq: Once | INTRAMUSCULAR | Status: AC
  Administered 2013-05-21: 50 mg via INTRAVENOUS

## 2013-05-21 MED ORDER — SODIUM CHLORIDE 0.9 % IV SOLN
Freq: Once | INTRAVENOUS | Status: AC
  Administered 2013-05-21: 10:00:00 via INTRAVENOUS

## 2013-05-21 MED ORDER — FAMOTIDINE IN NACL 20-0.9 MG/50ML-% IV SOLN
20.0000 mg | Freq: Once | INTRAVENOUS | Status: AC
  Administered 2013-05-21: 20 mg via INTRAVENOUS

## 2013-05-21 NOTE — Progress Notes (Signed)
OK to tx per Dr Arbutus Ped with ANC 1.3

## 2013-05-21 NOTE — Patient Instructions (Addendum)
[facility] Discharge Instructions for Patients Receiving Chemotherapy  Today you received the following chemotherapy agents taxol, carboplatin  To help prevent nausea and vomiting after your treatment, we encourage you to take your nausea medication if needed.   If you develop nausea and vomiting that is not controlled by your nausea medication, call the clinic.   BELOW ARE SYMPTOMS THAT SHOULD BE REPORTED IMMEDIATELY:  *FEVER GREATER THAN 100.5 F  *CHILLS WITH OR WITHOUT FEVER  NAUSEA AND VOMITING THAT IS NOT CONTROLLED WITH YOUR NAUSEA MEDICATION  *UNUSUAL SHORTNESS OF BREATH  *UNUSUAL BRUISING OR BLEEDING  TENDERNESS IN MOUTH AND THROAT WITH OR WITHOUT PRESENCE OF ULCERS  *URINARY PROBLEMS  *BOWEL PROBLEMS  UNUSUAL RASH Items with * indicate a potential emergency and should be followed up as soon as possible.  Feel free to call the clinic you have any questions or concerns. The clinic phone number is [phone].    

## 2013-05-22 ENCOUNTER — Ambulatory Visit
Admission: RE | Admit: 2013-05-22 | Discharge: 2013-05-22 | Disposition: A | Discharge status: Home / Self Care | Payer: Medicare Other | Source: Ambulatory Visit | Attending: Radiation Oncology | Admitting: Radiation Oncology

## 2013-05-23 ENCOUNTER — Other Ambulatory Visit: Payer: Self-pay

## 2013-05-23 ENCOUNTER — Ambulatory Visit
Admission: RE | Admit: 2013-05-23 | Discharge: 2013-05-23 | Disposition: A | Discharge status: Home / Self Care | Payer: Medicare Other | Source: Ambulatory Visit | Attending: Radiation Oncology | Admitting: Radiation Oncology

## 2013-05-24 ENCOUNTER — Ambulatory Visit
Admission: RE | Admit: 2013-05-24 | Discharge: 2013-05-24 | Disposition: A | Discharge status: Home / Self Care | Payer: Medicare Other | Source: Ambulatory Visit | Attending: Radiation Oncology | Admitting: Radiation Oncology

## 2013-05-24 ENCOUNTER — Encounter: Payer: Self-pay | Admitting: Radiation Oncology

## 2013-05-24 NOTE — Progress Notes (Signed)
Hyperpigmentation of left upper back noted. Mild hyperpigmentation of left upper chest noted. Reports using biafine bid as directed. Encourage her to continue this practice for the next two weeks. Denies difficulty or painful swallowing. Denies cough or shortness of breath. Denies fatigue. Provided patient with an appointment card to return in one month for follow up.

## 2013-05-24 NOTE — Progress Notes (Signed)
  Radiation Oncology         [phone] ________________________________  Name: Chelsea Cruz MRN: [id number]  Date: 05/24/2013  DOB: 01-May-1941  Weekly Radiation Therapy Management  Current Dose: 66 Gy     Planned Dose:  66 Gy  Narrative . . . . . . . . The patient presents for routine under treatment assessment.                                                      The patient is without complaint.                                 Set-up films were reviewed.                                 The chart was checked. Physical Findings. . .  weight is 111 lb 11.2 oz (50.667 kg). Her oral temperature is 97.7 F (36.5 C). Her blood pressure is 132/57 and her pulse is 103. Her respiration is 18 and oxygen saturation is 100%. . Weight essentially stable.  No significant changes. Impression . . . . . . . The patient is  tolerating radiation. Plan . . . . . . . . . . . . Complete treatment as planned.  ________________________________  [name]. Kathrynn Running, M.D.

## 2013-05-27 NOTE — Progress Notes (Signed)
  Radiation Oncology         [phone] ________________________________  Name: Chelsea Cruz MRN: [id number]  Date: 05/24/2013  DOB: 06-Jan-1941  End of Treatment Note  Diagnosis:  72 yo woman with a locally advanced T3 N0 M0 poorly differentiated squamous cell carcinoma involving the left upper and lower lobes  Indication for treatment:  Curative chemoradiotherapy       Radiation treatment dates:   04/06/2013-05/24/2013  Site/dose:   The patient's left lung mass received 66 gray in 33 fractions of 2 gray  Beams/energy:   A 4 field treatment technique was employed using a left anterior oblique and right posterior oblique gantry positions in conjunction with 2 dynamic conformal arcs to shape radiation conformally around the target, with 10 megavolt photons.  Daily image guidance was performed with cone beam CTs for precision set up.  Narrative: The patient tolerated radiation treatment relatively well.   She did not experience any significant acute toxicities. Fortunately, her cone beam CT imaging did show cavitation and regression of the treated tumor. Below our images from her original planning scan and a midway cone beam CT  Planning Scan     7/25 scan showing cavitation at the tumor site      Plan: The patient has completed radiation treatment. The patient will return to radiation oncology clinic for routine followup in one month. I advised them to call or return sooner if they have any questions or concerns related to their recovery or treatment. ________________________________  [name]. Kathrynn Running, M.D.

## 2013-05-28 ENCOUNTER — Other Ambulatory Visit (HOSPITAL_BASED_OUTPATIENT_CLINIC_OR_DEPARTMENT_OTHER): Payer: Medicare Other | Admitting: Lab

## 2013-05-28 DIAGNOSIS — C3410 Malignant neoplasm of upper lobe, unspecified bronchus or lung: Secondary | ICD-10-CM

## 2013-05-28 DIAGNOSIS — C3492 Malignant neoplasm of unspecified part of left bronchus or lung: Secondary | ICD-10-CM

## 2013-05-28 LAB — COMPREHENSIVE METABOLIC PANEL (CC13)
ALT: 20 U/L (ref 0–55)
AST: 18 U/L (ref 5–34)
Albumin: 3.4 g/dL — ABNORMAL LOW (ref 3.5–5.0)
CO2: 25 mEq/L (ref 22–29)
Calcium: 9.5 mg/dL (ref 8.4–10.4)
Chloride: 104 mEq/L (ref 98–109)
Creatinine: 0.8 mg/dL (ref 0.6–1.1)
Potassium: 4.4 mEq/L (ref 3.5–5.1)

## 2013-05-28 LAB — CBC WITH DIFFERENTIAL/PLATELET
BASO%: 1.1 % (ref 0.0–2.0)
Basophils Absolute: 0 10*3/uL (ref 0.0–0.1)
EOS%: 1.4 % (ref 0.0–7.0)
HCT: 29.2 % — ABNORMAL LOW (ref 34.8–46.6)
HGB: 9.8 g/dL — ABNORMAL LOW (ref 11.6–15.9)
MCH: 28.2 pg (ref 25.1–34.0)
MCHC: 33.7 g/dL (ref 31.5–36.0)
MONO#: 0.4 10*3/uL (ref 0.1–0.9)
NEUT%: 57.7 % (ref 38.4–76.8)
RDW: 27.1 % — ABNORMAL HIGH (ref 11.2–14.5)
WBC: 3 10*3/uL — ABNORMAL LOW (ref 3.9–10.3)
lymph#: 0.8 10*3/uL — ABNORMAL LOW (ref 0.9–3.3)

## 2013-06-04 ENCOUNTER — Other Ambulatory Visit (HOSPITAL_BASED_OUTPATIENT_CLINIC_OR_DEPARTMENT_OTHER): Payer: Medicare Other | Admitting: Lab

## 2013-06-04 DIAGNOSIS — C3492 Malignant neoplasm of unspecified part of left bronchus or lung: Secondary | ICD-10-CM

## 2013-06-04 DIAGNOSIS — C3410 Malignant neoplasm of upper lobe, unspecified bronchus or lung: Secondary | ICD-10-CM

## 2013-06-04 LAB — COMPREHENSIVE METABOLIC PANEL (CC13)
Alkaline Phosphatase: 57 U/L (ref 40–150)
BUN: 12.3 mg/dL (ref 7.0–26.0)
CO2: 24 mEq/L (ref 22–29)
Creatinine: 0.8 mg/dL (ref 0.6–1.1)
Glucose: 114 mg/dl (ref 70–140)
Sodium: 141 mEq/L (ref 136–145)
Total Bilirubin: 0.83 mg/dL (ref 0.20–1.20)
Total Protein: 6.6 g/dL (ref 6.4–8.3)

## 2013-06-04 LAB — CBC WITH DIFFERENTIAL/PLATELET
BASO%: 1 % (ref 0.0–2.0)
EOS%: 1.3 % (ref 0.0–7.0)
MCH: 29 pg (ref 25.1–34.0)
MCHC: 33.5 g/dL (ref 31.5–36.0)
MONO#: 0.9 10*3/uL (ref 0.1–0.9)
RBC: 3.52 10*6/uL — ABNORMAL LOW (ref 3.70–5.45)
RDW: 29.1 % — ABNORMAL HIGH (ref 11.2–14.5)
WBC: 3.7 10*3/uL — ABNORMAL LOW (ref 3.9–10.3)
lymph#: 0.9 10*3/uL (ref 0.9–3.3)

## 2013-06-11 ENCOUNTER — Other Ambulatory Visit (HOSPITAL_BASED_OUTPATIENT_CLINIC_OR_DEPARTMENT_OTHER): Payer: Medicare Other | Admitting: Lab

## 2013-06-11 DIAGNOSIS — C3410 Malignant neoplasm of upper lobe, unspecified bronchus or lung: Secondary | ICD-10-CM

## 2013-06-11 LAB — COMPREHENSIVE METABOLIC PANEL (CC13)
Alkaline Phosphatase: 64 U/L (ref 40–150)
BUN: 13.7 mg/dL (ref 7.0–26.0)
CO2: 26 mEq/L (ref 22–29)
Glucose: 85 mg/dl (ref 70–140)
Sodium: 142 mEq/L (ref 136–145)
Total Bilirubin: 0.82 mg/dL (ref 0.20–1.20)
Total Protein: 7.3 g/dL (ref 6.4–8.3)

## 2013-06-11 LAB — CBC WITH DIFFERENTIAL/PLATELET
Basophils Absolute: 0.1 10*3/uL (ref 0.0–0.1)
Eosinophils Absolute: 0.1 10*3/uL (ref 0.0–0.5)
HCT: 34 % — ABNORMAL LOW (ref 34.8–46.6)
HGB: 11.3 g/dL — ABNORMAL LOW (ref 11.6–15.9)
LYMPH%: 20.1 % (ref 14.0–49.7)
MCV: 87.8 fL (ref 79.5–101.0)
MONO#: 0.8 10*3/uL (ref 0.1–0.9)
MONO%: 13 % (ref 0.0–14.0)
NEUT#: 3.9 10*3/uL (ref 1.5–6.5)
NEUT%: 63.5 % (ref 38.4–76.8)
Platelets: 207 10*3/uL (ref 145–400)
RBC: 3.87 10*6/uL (ref 3.70–5.45)
WBC: 6.1 10*3/uL (ref 3.9–10.3)

## 2013-06-15 ENCOUNTER — Other Ambulatory Visit: Payer: Self-pay | Admitting: Medical Oncology

## 2013-06-19 ENCOUNTER — Other Ambulatory Visit (HOSPITAL_BASED_OUTPATIENT_CLINIC_OR_DEPARTMENT_OTHER): Payer: Medicare Other | Admitting: Lab

## 2013-06-19 ENCOUNTER — Ambulatory Visit (HOSPITAL_COMMUNITY)
Admission: RE | Admit: 2013-06-19 | Discharge: 2013-06-19 | Disposition: A | Discharge status: Home / Self Care | Payer: Medicare Other | Source: Ambulatory Visit | Attending: Physician Assistant | Admitting: Physician Assistant

## 2013-06-19 DIAGNOSIS — C3490 Malignant neoplasm of unspecified part of unspecified bronchus or lung: Secondary | ICD-10-CM | POA: Insufficient documentation

## 2013-06-19 DIAGNOSIS — C3410 Malignant neoplasm of upper lobe, unspecified bronchus or lung: Secondary | ICD-10-CM

## 2013-06-19 DIAGNOSIS — Z9221 Personal history of antineoplastic chemotherapy: Secondary | ICD-10-CM | POA: Insufficient documentation

## 2013-06-19 LAB — CBC WITH DIFFERENTIAL/PLATELET
Basophils Absolute: 0.1 10*3/uL (ref 0.0–0.1)
Eosinophils Absolute: 0.3 10*3/uL (ref 0.0–0.5)
HGB: 11.2 g/dL — ABNORMAL LOW (ref 11.6–15.9)
LYMPH%: 20.6 % (ref 14.0–49.7)
MONO#: 0.5 10*3/uL (ref 0.1–0.9)
NEUT#: 3.6 10*3/uL (ref 1.5–6.5)
Platelets: 119 10*3/uL — ABNORMAL LOW (ref 145–400)
RBC: 3.84 10*6/uL (ref 3.70–5.45)
RDW: 23.1 % — ABNORMAL HIGH (ref 11.2–14.5)
WBC: 5.7 10*3/uL (ref 3.9–10.3)
nRBC: 0 % (ref 0–0)

## 2013-06-19 LAB — COMPREHENSIVE METABOLIC PANEL (CC13)
ALT: 20 U/L (ref 0–55)
Albumin: 3.4 g/dL — ABNORMAL LOW (ref 3.5–5.0)
CO2: 25 mEq/L (ref 22–29)
Calcium: 9.6 mg/dL (ref 8.4–10.4)
Chloride: 105 mEq/L (ref 98–109)
Glucose: 121 mg/dl (ref 70–140)
Sodium: 140 mEq/L (ref 136–145)
Total Protein: 6.9 g/dL (ref 6.4–8.3)

## 2013-06-19 MED ORDER — IOHEXOL 300 MG/ML  SOLN
80.0000 mL | Freq: Once | INTRAMUSCULAR | Status: AC | PRN
  Administered 2013-06-19: 80 mL via INTRAVENOUS

## 2013-06-25 ENCOUNTER — Other Ambulatory Visit (HOSPITAL_BASED_OUTPATIENT_CLINIC_OR_DEPARTMENT_OTHER): Payer: Medicare Other | Admitting: Lab

## 2013-06-25 ENCOUNTER — Ambulatory Visit (HOSPITAL_BASED_OUTPATIENT_CLINIC_OR_DEPARTMENT_OTHER): Payer: Medicare Other | Admitting: Internal Medicine

## 2013-06-25 ENCOUNTER — Encounter: Payer: Self-pay | Admitting: Internal Medicine

## 2013-06-25 ENCOUNTER — Other Ambulatory Visit: Payer: Self-pay | Admitting: *Deleted

## 2013-06-25 DIAGNOSIS — C3410 Malignant neoplasm of upper lobe, unspecified bronchus or lung: Secondary | ICD-10-CM

## 2013-06-25 LAB — COMPREHENSIVE METABOLIC PANEL (CC13)
ALT: 17 U/L (ref 0–55)
AST: 19 U/L (ref 5–34)
CO2: 28 mEq/L (ref 22–29)
Calcium: 9.6 mg/dL (ref 8.4–10.4)
Chloride: 105 mEq/L (ref 98–109)
Sodium: 142 mEq/L (ref 136–145)
Total Protein: 6.7 g/dL (ref 6.4–8.3)

## 2013-06-25 LAB — CBC WITH DIFFERENTIAL/PLATELET
BASO%: 0.9 % (ref 0.0–2.0)
EOS%: 6.8 % (ref 0.0–7.0)
HCT: 33.5 % — ABNORMAL LOW (ref 34.8–46.6)
HGB: 11.3 g/dL — ABNORMAL LOW (ref 11.6–15.9)
MCHC: 33.7 g/dL (ref 31.5–36.0)
MONO#: 0.4 10*3/uL (ref 0.1–0.9)
NEUT#: 4.9 10*3/uL (ref 1.5–6.5)
Platelets: 179 10*3/uL (ref 145–400)
RBC: 3.69 10*6/uL — ABNORMAL LOW (ref 3.70–5.45)
RDW: 25.5 % — ABNORMAL HIGH (ref 11.2–14.5)
lymph#: 1.2 10*3/uL (ref 0.9–3.3)

## 2013-06-25 NOTE — Progress Notes (Signed)
[facility] Telephone:([phone]   Fax:([phone]  OFFICE PROGRESS NOTE  MCNEILL,[name], MD [address] [place] [address]  DIAGNOSIS: Stage IIB (T3, N0, M0) non-small cell lung cancer, poorly differentiated squamous cell carcinoma diagnosed in June of 2014.   PRIOR THERAPY:  Concurrent chemoradiation with weekly carboplatin for AUC of 2 and paclitaxel 45 mg/M2. Status post 5 cycles. First cycle was given on 04/09/2013. Last dose was given on 05/21/2013 with partial response.  CURRENT THERAPY: None  CHEMOTHERAPY INTENT: Control/curative  CURRENT # OF CHEMOTHERAPY CYCLES: 7  CURRENT ANTIEMETICS: Zofran, dexamethasone and Compazine  CURRENT SMOKING STATUS: Former smoker  ORAL CHEMOTHERAPY AND CONSENT: None  CURRENT BISPHOSPHONATES USE: None  PAIN MANAGEMENT: 0/10  NARCOTICS INDUCED CONSTIPATION: None  LIVING WILL AND CODE STATUS: Full code   INTERVAL HISTORY: Chelsea Cruz 72 y.o. female returns to the clinic today for followup visit accompanied by her daughter. The patient tolerated the previous course of concurrent chemoradiation fairly well with no significant adverse effects. She denied having any nausea or vomiting. She denied having any fever or chills. The patient denied having any significant peripheral neuropathy. She has no chest pain, shortness breath, cough or hemoptysis. The patient had repeat CT scan of the chest performed recently and she is here for evaluation and discussion of her scan results.  MEDICAL HISTORY: Past Medical History  Diagnosis Date  . Nodule of left lung   . Hypertension   . Nicotine addiction   . Osteopenia   . Osteomalacia   . Impaired fasting glucose   . Hypercholesteremia   . Depression   . Lung cancer     ALLERGIES:  is allergic to penicillins.  MEDICATIONS:  Current Outpatient Prescriptions  Medication Sig Dispense Refill  . alendronate (FOSAMAX) 70 MG tablet Take 70 mg by mouth every 7  (seven) days.       . cholecalciferol (VITAMIN D) 1000 UNITS tablet Take 3,000 Units by mouth daily. Takes 3 capsules      . ferrous sulfate 325 (65 FE) MG tablet Take 325 mg by mouth daily with breakfast.      . amLODipine (NORVASC) 2.5 MG tablet 2.5 mg daily.       Marland Kitchen lisinopril (PRINIVIL,ZESTRIL) 10 MG tablet Take 10 mg by mouth daily.       . prochlorperazine (COMPAZINE) 10 MG tablet Take 1 tablet (10 mg total) by mouth every 6 (six) hours as needed (for nausea and vomiting).  30 tablet  1   No current facility-administered medications for this visit.    SURGICAL HISTORY:  Past Surgical History  Procedure Laterality Date  . Cholecystectomy    . Right oophorectomy      1969    REVIEW OF SYSTEMS:  A comprehensive review of systems was negative.   PHYSICAL EXAMINATION: General appearance: alert, cooperative and no distress Head: Normocephalic, without obvious abnormality, atraumatic Neck: no adenopathy Lymph nodes: Cervical, supraclavicular, and axillary nodes normal. Resp: clear to auscultation bilaterally Cardio: regular rate and rhythm, S1, S2 normal, no murmur, click, rub or gallop GI: soft, non-tender; bowel sounds normal; no masses,  no organomegaly Extremities: extremities normal, atraumatic, no cyanosis or edema Neurologic: Alert and oriented X 3, normal strength and tone. Normal symmetric reflexes. Normal coordination and gait  ECOG PERFORMANCE STATUS: 1 - Symptomatic but completely ambulatory  Blood pressure 138/64, pulse 92, temperature 97 F (36.1 C), resp. rate 20, height 5\' 1"  (1.549 m), weight 115 lb  4.8 oz (52.3 kg).  LABORATORY DATA: Lab Results  Component Value Date   WBC 6.9 06/25/2013   HGB 11.3* 06/25/2013   HCT 33.5* 06/25/2013   MCV 90.6 06/25/2013   PLT 179 06/25/2013      Chemistry      Component Value Date/Time   NA 142 06/25/2013 0927   K 4.2 06/25/2013 0927   CL 98 04/09/2013 1114   CO2 28 06/25/2013 0927   BUN 8.8 06/25/2013 0927   CREATININE 0.9  06/25/2013 0927   CREATININE 0.79 03/16/2013 1500      Component Value Date/Time   CALCIUM 9.6 06/25/2013 0927   ALKPHOS 64 06/25/2013 0927   AST 19 06/25/2013 0927   ALT 17 06/25/2013 0927   BILITOT 1.02 06/25/2013 0927       RADIOGRAPHIC STUDIES: Ct Chest W Contrast  06/19/2013   *RADIOLOGY REPORT*  Clinical Data: Carcinoma of the lung with ongoing chemotherapy  CT CHEST WITH CONTRAST  Technique:  Multidetector CT imaging of the chest was performed following the standard protocol during bolus administration of intravenous contrast.  Contrast: 80mL OMNIPAQUE IOHEXOL 300 MG/ML  SOLN  Comparison: 03/06/2013  Findings: The right lung is well aerated without evidence of focal infiltrate or sizable effusion.  The left lung is also well aerated.  A cavitary lesion is noted in the left upper lobe.  This represents sequela from the previously seen large central mass. The cavitary lesion measures approximately 5.4 x 5.2 cm.  Some pleural thickening remains.  The degree of obstructive change centrally on the bronchial tree is minimal.  No hilar or mediastinal adenopathy is seen.  The thoracic aorta and pulmonary artery as visualized are within normal limits.  Scanning into the upper abdomen reveals fullness of the intrahepatic biliary system consistent with prior cholecystectomy. No other focal abnormality is noted.  No bony abnormality is seen.  IMPRESSION: Significant cavitation noted within the left lung mass when compared with the prior exam.  There has been reduction in the size of the mass when compared with the prior exam.   Original Report Authenticated By: Alcide Clever, M.D.    ASSESSMENT AND PLAN: This is a very pleasant 72 years old white female with stage IIB non-small cell lung cancer, poorly differentiated squamous cell carcinoma status post concurrent chemoradiation with significant improvement in her disease on his recent scan. I discussed the scan results and showed the images to the patient and her  daughter. With a significant improvement in her disease, I recommended for the patient to see Dr. Tyrone Sage for evaluation and consideration of surgical resection. If the patient is not a good surgical candidate for resection, I would see her back for evaluation and consideration of consolidation chemotherapy. I will arrange a followup appointment for her after her visit with Dr. Tyrone Sage. She was advised to call immediately if she has any concerning symptoms in the interval.  The patient voices understanding of current disease status and treatment options and is in agreement with the current care plan.  All questions were answered. The patient knows to call the clinic with any problems, questions or concerns. We can certainly see the patient much sooner if necessary.  I spent 15 minutes counseling the patient face to face. The total time spent in the appointment was 25 minutes.

## 2013-06-25 NOTE — Patient Instructions (Signed)
CURRENT THERAPY: None  CHEMOTHERAPY INTENT: Control/curative  CURRENT # OF CHEMOTHERAPY CYCLES: 7  CURRENT ANTIEMETICS: Zofran, dexamethasone and Compazine  CURRENT SMOKING STATUS: Former smoker  ORAL CHEMOTHERAPY AND CONSENT: None  CURRENT BISPHOSPHONATES USE: None  PAIN MANAGEMENT: 0/10  NARCOTICS INDUCED CONSTIPATION: None  LIVING WILL AND CODE STATUS: Full code

## 2013-06-28 ENCOUNTER — Encounter: Payer: Self-pay | Admitting: Radiation Oncology

## 2013-06-28 ENCOUNTER — Ambulatory Visit
Admission: RE | Admit: 2013-06-28 | Discharge: 2013-06-28 | Disposition: A | Discharge status: Home / Self Care | Payer: Medicare Other | Source: Ambulatory Visit | Attending: Radiation Oncology | Admitting: Radiation Oncology

## 2013-06-28 NOTE — Progress Notes (Signed)
Reports her last chemotherapy treatment was 05/21/2013. Denies pain. Reports she believe she is catching a cold. She reports that she had a sore throat this morning. Denies any painful or difficulty swallowing. Alopecia noted. Reports a dry cough. Denies nausea, vomiting, headache, or dizziness. Weight stable. Considering surgery or more chemotherapy to resolve residual tumor.

## 2013-06-28 NOTE — Progress Notes (Signed)
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  Reports her last chemotherapy treatment was 05/21/2013. Denies pain. Reports she believe she is catching a cold. She reports that she had a sore throat this morning. Denies any painful or difficulty swallowing. Alopecia noted. Reports a dry cough. Denies nausea, vomiting, headache, or dizziness. Weight stable. Considering surgery or more chemotherapy to resolve residual tumor.                           ALLERGIES:  is allergic to penicillins.  Meds: Current Outpatient Prescriptions  Medication Sig Dispense Refill  . alendronate (FOSAMAX) 70 MG tablet Take 70 mg by mouth every 7 (seven) days.       . cholecalciferol (VITAMIN D) 1000 UNITS tablet Take 3,000 Units by mouth daily. Takes 3 capsules      . ferrous sulfate 325 (65 FE) MG tablet Take 325 mg by mouth daily with breakfast.      . amLODipine (NORVASC) 2.5 MG tablet 2.5 mg daily.       Marland Kitchen lisinopril (PRINIVIL,ZESTRIL) 10 MG tablet Take 10 mg by mouth daily.       . prochlorperazine (COMPAZINE) 10 MG tablet Take 1 tablet (10 mg total) by mouth every 6 (six) hours as needed (for nausea and vomiting).  30 tablet  1   No current facility-administered medications for this encounter.    Physical Findings: The patient is in no acute distress. Patient is alert and oriented.  weight is 114 lb 14.4 oz (52.118 kg). Her oral temperature is 98.2 F (36.8 C). Her blood pressure is 130/60 and her pulse is 106. Her  respiration is 16 and oxygen saturation is 97%. .  No significant changes.  Lab Findings: Lab Results  Component Value Date   WBC 6.9 06/25/2013   HGB 11.3* 06/25/2013   HCT 33.5* 06/25/2013   MCV 90.6 06/25/2013   PLT 179 06/25/2013   Radiographic Findings: Ct Chest W Contrast  06/19/2013   *RADIOLOGY REPORT*  Clinical Data: Carcinoma of the lung with ongoing chemotherapy  CT CHEST WITH CONTRAST  Technique:  Multidetector CT imaging of the chest was performed following the standard protocol during bolus administration of intravenous contrast.  Contrast: 80mL OMNIPAQUE IOHEXOL 300 MG/ML  SOLN  Comparison: 03/06/2013  Findings: The right lung is well aerated without evidence of focal infiltrate or sizable effusion.  The left lung is also well aerated.  A cavitary lesion is noted in the left upper lobe.  This represents sequela from the previously seen large central mass. The cavitary lesion measures approximately 5.4 x 5.2 cm.  Some pleural thickening remains.  The degree of obstructive change centrally on the bronchial tree is minimal.  No hilar or mediastinal adenopathy is seen.  The thoracic aorta and pulmonary artery as visualized are within normal limits.  Scanning into the upper abdomen reveals fullness of the intrahepatic biliary system consistent with prior cholecystectomy. No other focal abnormality is  noted.  No bony abnormality is seen.  IMPRESSION: Significant cavitation noted within the left lung mass when compared with the prior exam.  There has been reduction in the size of the mass when compared with the prior exam.   Original Report Authenticated By: Alcide Clever, M.D.    Impression:  The patient is recovering from the effects of radiation.  She has had a good response to chemo radiation, so, now the question is whether resection is indicated.  Plan:  She'll see Gerhardt for surgery consideration, and follow up with [facility].  _____________________________________  [name]. Kathrynn Running,  M.D.

## 2013-06-29 ENCOUNTER — Ambulatory Visit (INDEPENDENT_AMBULATORY_CARE_PROVIDER_SITE_OTHER): Payer: Medicare Other | Admitting: Cardiothoracic Surgery

## 2013-06-29 ENCOUNTER — Other Ambulatory Visit: Payer: Self-pay | Admitting: *Deleted

## 2013-06-29 ENCOUNTER — Encounter: Payer: Self-pay | Admitting: Cardiothoracic Surgery

## 2013-06-29 VITALS — BP 122/68 | HR 100 | Resp 16 | Ht 61.0 in | Wt 114.0 lb

## 2013-06-29 DIAGNOSIS — C3490 Malignant neoplasm of unspecified part of unspecified bronchus or lung: Secondary | ICD-10-CM

## 2013-06-29 NOTE — Progress Notes (Signed)
[address]       [place] [address]             [phone]                    KERRI-ANNE HAEBERLE [facility] Medical Record #[id number] Date of Birth: 1941/03/17  Referring: Dr. Arbutus Ped  MD Primary Care: Gweneth Dimitri, MD  Chief Complaint:   Lung Cancer  History of Present Illness:    Patient is a 72 year old female with a 40 year history of smoking who presented  in May with recent weight loss and shortness of breath with exertion. A  chest x-ray was obtained which revealed a 9 cm  left lung mass. Biopsy revealed POSITIVE FOR POORLY DIFFERENTIATED SQUAMOUS CELL CARCINOMA. at least clinical stage cT3,cN0,cM0 Stage II B   Because of the size of the mass and need for pneumonectomy with very limited pulmonary reserve the patient underwent treatment with radiation and chemotherapy. Concurrent chemoradiation with weekly carboplatin for AUC of 2 and paclitaxel 45 mg/M2. Status post 5 cycles. First cycle was given on 04/09/2013. Last dose was given on 05/21/2013.   Patient returns today with a followup CT scan which has showed a significant response.   Current Activity/ Functional Status:  Patient is independent with mobility/ambulation, transfers, ADL's, IADL's.  Zubrod Score: At the time of surgery this patient's most appropriate activity status/level should be described as: []  Normal activity, no symptoms [x]  Symptoms, fully ambulatory []  Symptoms, in bed less than or equal to 50% of the time []  Symptoms, in bed greater than 50% of the time but less than 100% []  Bedridden []  Moribund   Past Medical History  Diagnosis Date      . Hypertension   . Nicotine addiction   . Osteopenia   . Osteomalacia   . Impaired fasting glucose   . Hypercholesteremia   . Depression   . Lung cancer     Past Surgical History  Procedure Laterality Date  . Cholecystectomy    . Right oophorectomy      1969    Family History  Problem Relation Age of Onset  .  Heart disease Mother   . Coronary artery disease Brother   . Alzheimer's disease Brother   . Heart disease Brother   . Hypertension Brother     #2  . Stroke Brother     #2  . Brain cancer Brother     #3    History   Social History  . Marital Status: Widowed    Spouse Name: N/A    Number of Children: N/A  . Years of Education: N/A   Occupational History  . retired    Social History Main Topics  . Smoking status: Former Smoker -- 1.00 packs/day for 45 years    Types: Cigarettes    Quit date: 03/06/2013  . Smokeless tobacco: Never Used  . Alcohol Use: No  . Drug Use: No  . Sexual Activity: No   Other Topics Concern  . Not on file   Social History Narrative  . No narrative on file    History  Smoking status  . Current Every Day Smoker  . Types: Cigarettes  Smokeless tobacco  . no    History  Alcohol Use No     Allergies  Allergen Reactions  . Penicillins     Current Outpatient Prescriptions  Medication Sig Dispense Refill  . alendronate (FOSAMAX)  70 MG tablet Take 70 mg by mouth every 7 (seven) days.       . cholecalciferol (VITAMIN D) 1000 UNITS tablet Take 3,000 Units by mouth daily. Takes 3 capsules      . ferrous sulfate 325 (65 FE) MG tablet Take 325 mg by mouth daily with breakfast.      . prochlorperazine (COMPAZINE) 10 MG tablet Take 1 tablet (10 mg total) by mouth every 6 (six) hours as needed (for nausea and vomiting).  30 tablet  1  . amLODipine (NORVASC) 2.5 MG tablet 2.5 mg daily.       Marland Kitchen lisinopril (PRINIVIL,ZESTRIL) 10 MG tablet Take 10 mg by mouth daily.        No current facility-administered medications for this visit.       Review of Systems:     Cardiac Review of Systems: Y or N  Chest Pain [  n  ]  Resting SOB [ n  ] Exertional SOB  [ y ]  Orthopnea [ n ]   Pedal Edema [n   ]    Palpitations [ n ] Syncope  [ n ]   Presyncope [  n ]  General Review of Systems: [Y] = yes [  ]=no Constitional: recent weight change [ y 9 lbs  year ]; anorexia [ y ]; fatigue [ y ]; nausea [n  ]; night sweats [ y ]; fever [ n ]; or chills [ n ];                                                                                                                                          Dental: poor dentition[ n ]; Last Dentist visit:   Eye : blurred vision [n  ]; diplopia [  n ]; vision changes [ n ];  Amaurosis fugax[ n ]; Resp: cough Cove.Etienne  ];  wheezing[n  ];  hemoptysis[n  ]; shortness of breath[ y ]; paroxysmal nocturnal dyspnea[n  ]; dyspnea on exertion[y  ]; or orthopnea[  ];  GI:  gallstones[  ], vomiting[n  ];  dysphagia[  ]; melena[  n];  hematochezia [  ]; heartburn[  ];   Hx of  Colonoscopy[ y ]; GU: kidney stones [  ]; hematuria[  ];   dysuria [  ];  nocturia[  ];  history of     obstruction [n  ]; urinary frequency [  ]             Skin: rash, swelling[  ];, hair loss[  ];  peripheral edema[  ];  or itching[  ]; Musculosketetal: myalgias[  ];  joint swelling[  ];  joint erythema[  ];  joint pain[  ];  back pain[  ];  Heme/Lymph: bruising[  ];  bleeding[  ];  anemia[  ];  Neuro: TIA[  ];  headaches[  ];  stroke[ n ];  vertigo[n  ];  seizures[n  ];   paresthesias[ n ];  difficulty walking[ n ];  Psych:depression[  ]; anxiety[  ];  Endocrine: diabetes[  ];  thyroid dysfunction[  ];  Immunizations: Flu Cove.Etienne  ]; Pneumococcal[ y ];  Other:  Physical Exam: BP 122/68  Pulse 100  Resp 16  Ht 5\' 1"  (1.549 m)  Wt 114 lb (51.71 kg)  BMI 21.55 kg/m2  SpO2 96%  General appearance: alert, cooperative, appears stated age, no distress and pale Neurologic: intact Heart: regular rate and rhythm, S1, S2 normal, no murmur, click, rub or gallop and normal apical impulse Lungs: clear to auscultation bilaterally and normal percussion bilaterally Abdomen: soft, non-tender; bowel sounds normal; no masses,  no organomegaly Extremities: extremities normal, atraumatic, no cyanosis or edema and Homans sign is negative, no sign of DVT Patient has no  carotid bruits, no cervical or supraclavicular adenopathy no axillary adenopathy no inguinal adenopathy, just palpable DP and PT pulses bilaterally, she has no clubbing of the nails Alopecia noted  Diagnostic Studies & Laboratory data:     Recent Radiology Findings:  Ct Chest W Contrast  06/19/2013   *RADIOLOGY REPORT*  Clinical Data: Carcinoma of the lung with ongoing chemotherapy  CT CHEST WITH CONTRAST  Technique:  Multidetector CT imaging of the chest was performed following the standard protocol during bolus administration of intravenous contrast.  Contrast: 80mL OMNIPAQUE IOHEXOL 300 MG/ML  SOLN  Comparison: 03/06/2013  Findings: The right lung is well aerated without evidence of focal infiltrate or sizable effusion.  The left lung is also well aerated.  A cavitary lesion is noted in the left upper lobe.  This represents sequela from the previously seen large central mass. The cavitary lesion measures approximately 5.4 x 5.2 cm.  Some pleural thickening remains.  The degree of obstructive change centrally on the bronchial tree is minimal.  No hilar or mediastinal adenopathy is seen.  The thoracic aorta and pulmonary artery as visualized are within normal limits.  Scanning into the upper abdomen reveals fullness of the intrahepatic biliary system consistent with prior cholecystectomy. No other focal abnormality is noted.  No bony abnormality is seen.  IMPRESSION: Significant cavitation noted within the left lung mass when compared with the prior exam.  There has been reduction in the size of the mass when compared with the prior exam.   Original Report Authenticated By: Alcide Clever, M.D.   Mr [name] ZO Contrast  03/16/2013   *RADIOLOGY REPORT*  Clinical Data: Lung cancer.  Headaches.  MRI HEAD WITHOUT AND WITH CONTRAST  Technique:  Multiplanar, multiecho pulse sequences of the brain and surrounding structures were obtained according to standard protocol without and with intravenous contrast  Contrast:  10mL MULTIHANCE GADOBENATE DIMEGLUMINE 529 MG/ML IV SOLN  Comparison: None.  Findings: No acute infarct, hemorrhage, mass lesion is present.  The postcontrast images demonstrate no pathologic enhancement to suggest metastatic disease of the brain or meninges.  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  Mild generalized atrophy is within normal limits for age.  Periventricular subcortical T2 hyperintensities are slightly greater than expected for age.  There is no associated enhancement.  Flow is present in the major intracranial arteries.  The patient is status post bilateral lens extractions.  The paranasal sinuses are clear.  There is some fluid in the left mastoid air cells.  No obstructing nasopharyngeal lesion is evident.  IMPRESSION:  1.  No evidence for metastatic disease of the brain or meninges. 2.  Mild atrophy white matter disease.  This likely reflects the sequelae of chronic microvascular ischemia. 3.  Left mastoid effusion.  No obstructing nasopharyngeal lesion or enhancement is evident.   Original Report Authenticated By: Marin Roberts, M.D.   Nm Pet Image Initial (pi) Skull Base To Thigh  03/16/2013   *RADIOLOGY REPORT*  Clinical Data: Initial treatment strategy for left lung mass. History of smoking and weight loss.  NUCLEAR MEDICINE PET SKULL BASE TO THIGH  Fasting Blood Glucose:  110  Technique:  18.4 mCi F-18 FDG was injected intravenously. CT data was obtained and used for attenuation correction and anatomic localization only.  (This was not acquired as a diagnostic CT examination.) Additional exam technical data entered on technologist worksheet.  Comparison:  Chest CT 03/06/2013.  Findings:  Neck: No hypermetabolic lymph nodes in the neck.  There is physiologic activity associate with the vocal cords.  Chest:  The large left lung mass is markedly hypermetabolic.  The hypermetabolic activity is primarily  peripheral consistent with central necrosis.  SUV max is 30.2.  This mass measures 9.3 x 7.1 cm transverse and has an epicenter in the upper lobe, although crosses the major fissure.  Apart from this dominant mass, there is no hypermetabolic pulmonary or mediastinal nodal activity.  Abdomen/Pelvis:  No abnormal hypermetabolic activity within the liver, pancreas, adrenal glands, or spleen.  Both adrenal glands demonstrate mild low density prominence without abnormal metabolic activity.  No hypermetabolic lymph nodes in the abdomen or pelvis. Minimal biliary prominence appears unchanged.  Skeleton:  No focal hypermetabolic activity to suggest skeletal metastasis.  IMPRESSION:  1.  The large left lung mass is markedly hypermetabolic consistent with bronchogenic carcinoma.  This involves the upper and lower lobes and occludes the upper lobe bronchus. 2.  No mediastinal invasion or distant metastases demonstrated.   Original Report Authenticated By: Carey Bullocks, M.D.   Ct Chest W Contrast  03/06/2013   *RADIOLOGY REPORT*  Clinical Data: Abnormal chest x-ray, smoking history, cough  CT CHEST WITH CONTRAST  Technique:  Multidetector CT imaging of the chest was performed following the standard protocol during bolus administration of intravenous contrast.  Contrast:  75 ml Omnipaque-300  Comparison: Chest x-ray of 03/06/2013  Findings: At the site of the opacity on chest x-ray, there is a large soft tissue mass occupying much of the mid left upper lobe extending anteriorly.  This mass measures 6.9 x 9.4 x 5.9 cm and encircles the bronchus to the left upper lobe, consistent with primary lung carcinoma.  Coarse markings adjacent to this mass posteriorly and inferiorly may represent lymphangitic spread. Diffuse changes of centrilobular emphysema are noted.  No right lung involvement is noted.  No pleural effusion is seen.  On soft tissue window images, the thyroid gland is unremarkable. The thoracic aorta opacifies and  the origins of the great vessels are patent.  The pulmonary arteries also are unremarkable.  On the soft tissue window images the mass in the left upper lobe appears to contain multiple areas of necrosis.  There are small mediastinal lymph nodes present none of which appear pathologically enlarged currently.  No evidence of liver metastatic involvement is seen on the few images obtained although there is some prominence of central intrahepatic ducts.  No bony abnormality is noted.  IMPRESSION:  1.  Large soft tissue mass occupies much of the mid and anterior left upper lobe consistent with primary lung carcinoma, encircling  the left upper lobe bronchus. 2.  Small mediastinal lymph nodes, none of which are pathologically enlarged. 3.  Diffuse centrilobular emphysema. 4.  Slight prominence of central intrahepatic ducts of questionable significance on the few images through the upper abdomen obtained.   Original Report Authenticated By: Dwyane Dee, M.D.    Recent Lab Findings: Lab Results  Component Value Date   WBC 6.9 06/25/2013   FEV1   .98  52%  DLCO   8.17 40 %   Assessment / Plan:    Initially the patient's mass was of such size involving both the upper and lower lobe  pneumonectomy would have to be done . PFT'S show marginal reserve especially with  need for Pneumonectomy so operative treatment was not offered. Because of the patient's limited pulmonary reserve both on PFTs and significant shortness of breath just climbing stairs I do not feel she would tolerate a pneumonectomy.   Significant cavitation noted within the left lung mass when compared with the prior exam.  There has been reduction in the size of the mass when compared with the prior exam.  I discussed with the patient the pros and cons of proceeding with surgery, her PFTs done in the spring will be repeated before making a final decision. Consideration for left upper lobectomy if her PFTs are improved and she is willing.   Repeat  PFTs will be done early next week and I will see her immediately after.  Delight Ovens MD  Beeper [phone] Office [phone] 06/29/2013 2:18 PM

## 2013-07-02 ENCOUNTER — Ambulatory Visit (HOSPITAL_COMMUNITY)
Admission: RE | Admit: 2013-07-02 | Discharge: 2013-07-02 | Disposition: A | Discharge status: Home / Self Care | Payer: Medicare Other | Source: Ambulatory Visit | Attending: Cardiothoracic Surgery | Admitting: Cardiothoracic Surgery

## 2013-07-02 DIAGNOSIS — C3490 Malignant neoplasm of unspecified part of unspecified bronchus or lung: Secondary | ICD-10-CM | POA: Insufficient documentation

## 2013-07-02 DIAGNOSIS — J988 Other specified respiratory disorders: Secondary | ICD-10-CM | POA: Insufficient documentation

## 2013-07-02 LAB — PULMONARY FUNCTION TEST

## 2013-07-02 MED ORDER — ALBUTEROL SULFATE (5 MG/ML) 0.5% IN NEBU
2.5000 mg | INHALATION_SOLUTION | Freq: Once | RESPIRATORY_TRACT | Status: AC
  Administered 2013-07-02: 2.5 mg via RESPIRATORY_TRACT

## 2013-07-05 ENCOUNTER — Encounter: Payer: Self-pay | Admitting: Cardiothoracic Surgery

## 2013-07-05 ENCOUNTER — Ambulatory Visit (INDEPENDENT_AMBULATORY_CARE_PROVIDER_SITE_OTHER): Payer: Medicare Other | Admitting: Cardiothoracic Surgery

## 2013-07-05 DIAGNOSIS — C3410 Malignant neoplasm of upper lobe, unspecified bronchus or lung: Secondary | ICD-10-CM

## 2013-07-05 NOTE — Progress Notes (Addendum)
[address]       [place] [address]             [phone]                   NIMSI MALES [facility] Medical Record #[id number] Date of Birth: 07-Nov-1940  Referring: Dr. Arbutus Ped  MD Primary Care: Gweneth Dimitri, MD  Chief Complaint:   Lung Cancer  History of Present Illness:    Patient is a 72 year old female with a 40 year history of smoking who presented  in May with recent weight loss and shortness of breath with exertion. A  chest x-ray was obtained which revealed a 9 cm  left lung mass. Biopsy revealed POSITIVE FOR POORLY DIFFERENTIATED SQUAMOUS CELL CARCINOMA. at least clinical stage cT3,cN0,cM0 Stage II B   Because of the size of the mass and need for pneumonectomy with very limited pulmonary reserve the patient underwent treatment with radiation and chemotherapy. Concurrent chemoradiation with weekly carboplatin for AUC of 2 and paclitaxel 45 mg/M2. Status post 5 cycles. First cycle was given on 04/09/2013. Last dose was given on 05/21/2013.   Patient returns today with a followup CT scan which has showed a significant response and follow up PFT's   Current Activity/ Functional Status:  Patient is independent with mobility/ambulation, transfers, ADL's, IADL's.  Zubrod Score: At the time of surgery this patient's most appropriate activity status/level should be described as: []  Normal activity, no symptoms [x]  Symptoms, fully ambulatory []  Symptoms, in bed less than or equal to 50% of the time []  Symptoms, in bed greater than 50% of the time but less than 100% []  Bedridden []  Moribund   Past Medical History  Diagnosis Date      . Hypertension   . Nicotine addiction   . Osteopenia   . Osteomalacia   . Impaired fasting glucose   . Hypercholesteremia   . Depression   . Lung cancer     Past Surgical History  Procedure Laterality Date  . Cholecystectomy    . Right oophorectomy      1969    Family History  Problem Relation Age  of Onset  . Heart disease Mother   . Coronary artery disease Brother   . Alzheimer's disease Brother   . Heart disease Brother   . Hypertension Brother     #2  . Stroke Brother     #2  . Brain cancer Brother     #3    History   Social History  . Marital Status: Widowed    Spouse Name: N/A    Number of Children: N/A  . Years of Education: N/A   Occupational History  . retired    Social History Main Topics  . Smoking status: Former Smoker -- 1.00 packs/day for 45 years    Types: Cigarettes    Quit date: 03/06/2013  . Smokeless tobacco: Never Used  . Alcohol Use: No  . Drug Use: No  . Sexual Activity: No   Other Topics Concern  . Not on file   Social History Narrative  . No narrative on file    History  Smoking status  . Current Every Day Smoker  . Types: Cigarettes  Smokeless tobacco  . no    History  Alcohol Use No     Allergies  Allergen Reactions  . Penicillins     Current Outpatient Prescriptions  Medication Sig Dispense Refill  . alendronate (FOSAMAX)  70 MG tablet Take 70 mg by mouth every 7 (seven) days.       Marland Kitchen amLODipine (NORVASC) 2.5 MG tablet 2.5 mg daily.       . cholecalciferol (VITAMIN D) 1000 UNITS tablet Take 3,000 Units by mouth daily. Takes 3 capsules      . ferrous sulfate 325 (65 FE) MG tablet Take 325 mg by mouth daily with breakfast.      . lisinopril (PRINIVIL,ZESTRIL) 10 MG tablet Take 10 mg by mouth daily.       . prochlorperazine (COMPAZINE) 10 MG tablet Take 1 tablet (10 mg total) by mouth every 6 (six) hours as needed (for nausea and vomiting).  30 tablet  1   No current facility-administered medications for this visit.       Review of Systems:     Cardiac Review of Systems: Y or N  Chest Pain [  n  ]  Resting SOB [ n  ] Exertional SOB  [ y ]  Orthopnea [ n ]   Pedal Edema [n   ]    Palpitations [ n ] Syncope  [ n ]   Presyncope [  n ]  General Review of Systems: [Y] = yes [  ]=no Constitional: recent weight change  [ y 9 lbs year ]; anorexia [ y ]; fatigue [ y ]; nausea [n  ]; night sweats [ y ]; fever [ n ]; or chills [ n ];                                                                                                                                          Dental: poor dentition[ n ]; Last Dentist visit:   Eye : blurred vision [n  ]; diplopia [  n ]; vision changes [ n ];  Amaurosis fugax[ n ]; Resp: cough Cove.Etienne  ];  wheezing[n  ];  hemoptysis[n  ]; shortness of breath[ y ]; paroxysmal nocturnal dyspnea[n  ]; dyspnea on exertion[y  ]; or orthopnea[  ];  GI:  gallstones[  ], vomiting[n  ];  dysphagia[  ]; melena[  n];  hematochezia [  ]; heartburn[  ];   Hx of  Colonoscopy[ y ]; GU: kidney stones [  ]; hematuria[  ];   dysuria [  ];  nocturia[  ];  history of     obstruction [n  ]; urinary frequency [  ]             Skin: rash, swelling[  ];, hair loss[  ];  peripheral edema[  ];  or itching[  ]; Musculosketetal: myalgias[  ];  joint swelling[  ];  joint erythema[  ];  joint pain[  ];  back pain[  ];  Heme/Lymph: bruising[  ];  bleeding[  ];  anemia[  ];  Neuro: TIA[  ];  headaches[  ];  stroke[ n ];  vertigo[n  ];  seizures[n  ];   paresthesias[ n ];  difficulty walking[ n ];  Psych:depression[  ]; anxiety[  ];  Endocrine: diabetes[  ];  thyroid dysfunction[  ];  Immunizations: Flu Cove.Etienne  ]; Pneumococcal[ y ];  Other:  Physical Exam: BP 127/68  Pulse 114  Resp 16  Ht 5\' 1"  (1.549 m)  Wt 114 lb (51.71 kg)  BMI 21.55 kg/m2  SpO2 94%  General appearance: alert, cooperative, appears stated age, no distress and pale Neurologic: intact Heart: regular rate and rhythm, S1, S2 normal, no murmur, click, rub or gallop and normal apical impulse Lungs: clear to auscultation bilaterally and normal percussion bilaterally Abdomen: soft, non-tender; bowel sounds normal; no masses,  no organomegaly Extremities: extremities normal, atraumatic, no cyanosis or edema and Homans sign is negative, no sign of DVT Patient  has no carotid bruits, no cervical or supraclavicular adenopathy no axillary adenopathy no inguinal adenopathy, just palpable DP and PT pulses bilaterally, she has no clubbing of the nails Alopecia noted  Diagnostic Studies & Laboratory data:     Recent Radiology Findings:  Ct Chest W Contrast  06/19/2013   *RADIOLOGY REPORT*  Clinical Data: Carcinoma of the lung with ongoing chemotherapy  CT CHEST WITH CONTRAST  Technique:  Multidetector CT imaging of the chest was performed following the standard protocol during bolus administration of intravenous contrast.  Contrast: 80mL OMNIPAQUE IOHEXOL 300 MG/ML  SOLN  Comparison: 03/06/2013  Findings: The right lung is well aerated without evidence of focal infiltrate or sizable effusion.  The left lung is also well aerated.  A cavitary lesion is noted in the left upper lobe.  This represents sequela from the previously seen large central mass. The cavitary lesion measures approximately 5.4 x 5.2 cm.  Some pleural thickening remains.  The degree of obstructive change centrally on the bronchial tree is minimal.  No hilar or mediastinal adenopathy is seen.  The thoracic aorta and pulmonary artery as visualized are within normal limits.  Scanning into the upper abdomen reveals fullness of the intrahepatic biliary system consistent with prior cholecystectomy. No other focal abnormality is noted.  No bony abnormality is seen.  IMPRESSION: Significant cavitation noted within the left lung mass when compared with the prior exam.  There has been reduction in the size of the mass when compared with the prior exam.   Original Report Authenticated By: Alcide Clever, M.D.   Mr [name] ZO Contrast  03/16/2013   *RADIOLOGY REPORT*  Clinical Data: Lung cancer.  Headaches.  MRI HEAD WITHOUT AND WITH CONTRAST  Technique:  Multiplanar, multiecho pulse sequences of the brain and surrounding structures were obtained according to standard protocol without and with intravenous contrast   Contrast: 10mL MULTIHANCE GADOBENATE DIMEGLUMINE 529 MG/ML IV SOLN  Comparison: None.  Findings: No acute infarct, hemorrhage, mass lesion is present.  The postcontrast images demonstrate no pathologic enhancement to suggest metastatic disease of the brain or meninges.  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  Mild generalized atrophy is within normal limits for age.  Periventricular subcortical T2 hyperintensities are slightly greater than expected for age.  There is no associated enhancement.  Flow is present in the major intracranial arteries.  The patient is status post bilateral lens extractions.  The paranasal sinuses are clear.  There is some fluid in the left mastoid air cells.  No obstructing nasopharyngeal lesion is evident.  IMPRESSION:  1.  No evidence for metastatic disease of the brain or meninges. 2.  Mild atrophy white matter disease.  This likely reflects the sequelae of chronic microvascular ischemia. 3.  Left mastoid effusion.  No obstructing nasopharyngeal lesion or enhancement is evident.   Original Report Authenticated By: Marin Roberts, M.D.   Nm Pet Image Initial (pi) Skull Base To Thigh  03/16/2013   *RADIOLOGY REPORT*  Clinical Data: Initial treatment strategy for left lung mass. History of smoking and weight loss.  NUCLEAR MEDICINE PET SKULL BASE TO THIGH  Fasting Blood Glucose:  110  Technique:  18.4 mCi F-18 FDG was injected intravenously. CT data was obtained and used for attenuation correction and anatomic localization only.  (This was not acquired as a diagnostic CT examination.) Additional exam technical data entered on technologist worksheet.  Comparison:  Chest CT 03/06/2013.  Findings:  Neck: No hypermetabolic lymph nodes in the neck.  There is physiologic activity associate with the vocal cords.  Chest:  The large left lung mass is markedly hypermetabolic.  The hypermetabolic activity is  primarily peripheral consistent with central necrosis.  SUV max is 30.2.  This mass measures 9.3 x 7.1 cm transverse and has an epicenter in the upper lobe, although crosses the major fissure.  Apart from this dominant mass, there is no hypermetabolic pulmonary or mediastinal nodal activity.  Abdomen/Pelvis:  No abnormal hypermetabolic activity within the liver, pancreas, adrenal glands, or spleen.  Both adrenal glands demonstrate mild low density prominence without abnormal metabolic activity.  No hypermetabolic lymph nodes in the abdomen or pelvis. Minimal biliary prominence appears unchanged.  Skeleton:  No focal hypermetabolic activity to suggest skeletal metastasis.  IMPRESSION:  1.  The large left lung mass is markedly hypermetabolic consistent with bronchogenic carcinoma.  This involves the upper and lower lobes and occludes the upper lobe bronchus. 2.  No mediastinal invasion or distant metastases demonstrated.   Original Report Authenticated By: Carey Bullocks, M.D.   Ct Chest W Contrast  03/06/2013   *RADIOLOGY REPORT*  Clinical Data: Abnormal chest x-ray, smoking history, cough  CT CHEST WITH CONTRAST  Technique:  Multidetector CT imaging of the chest was performed following the standard protocol during bolus administration of intravenous contrast.  Contrast:  75 ml Omnipaque-300  Comparison: Chest x-ray of 03/06/2013  Findings: At the site of the opacity on chest x-ray, there is a large soft tissue mass occupying much of the mid left upper lobe extending anteriorly.  This mass measures 6.9 x 9.4 x 5.9 cm and encircles the bronchus to the left upper lobe, consistent with primary lung carcinoma.  Coarse markings adjacent to this mass posteriorly and inferiorly may represent lymphangitic spread. Diffuse changes of centrilobular emphysema are noted.  No right lung involvement is noted.  No pleural effusion is seen.  On soft tissue window images, the thyroid gland is unremarkable. The thoracic aorta  opacifies and the origins of the great vessels are patent.  The pulmonary arteries also are unremarkable.  On the soft tissue window images the mass in the left upper lobe appears to contain multiple areas of necrosis.  There are small mediastinal lymph nodes present none of which appear pathologically enlarged currently.  No evidence of liver metastatic involvement is seen on the few images obtained although there is some prominence of central intrahepatic ducts.  No bony abnormality is noted.  IMPRESSION:  1.  Large soft tissue mass occupies much of the mid and anterior left upper lobe consistent with primary lung carcinoma, encircling  the left upper lobe bronchus. 2.  Small mediastinal lymph nodes, none of which are pathologically enlarged. 3.  Diffuse centrilobular emphysema. 4.  Slight prominence of central intrahepatic ducts of questionable significance on the few images through the upper abdomen obtained.   Original Report Authenticated By: Dwyane Dee, M.D.    Recent Lab Findings: Lab Results  Component Value Date   WBC 6.9 06/25/2013   FEV1   .98  52%  DLCO   8.17 40 % Repeat PFT are very similar : FEV1 1.02 54%,, DLCO 8.67  42%  Assessment / Plan:    Initially the patient's mass was of such size involving both the upper and lower lobe. PFT'S show marginal reserve especially with  need for pneumonectomy so initial  operative treatment was not offered. Repeat PFT's have now been preformed with patient not smoking for several months but are unchanged. Because of the patient's limited pulmonary reserve both on PFTs and significant shortness of breath just climbing stairs I do not feel she would tolerate lung resection.   I have discussed the surgical options with the patient. In light of  her limited pulmonary reserve  I do not think surgical resection will improve her outcome. She is relieved with this as she did not wish to have surgery anyway. Have not made a followup appointment for her to see  me but would be glad to see her at her or Dr. Arbutus Ped to requested anytime.  Delight Ovens MD  Beeper [phone] Office [phone] 07/05/2013 1:02 PM

## 2013-07-06 ENCOUNTER — Encounter: Payer: Self-pay | Admitting: *Deleted

## 2013-07-06 ENCOUNTER — Other Ambulatory Visit: Payer: Self-pay | Admitting: *Deleted

## 2013-07-06 NOTE — Progress Notes (Signed)
Called left vm message regarding appt 07/24/13 at 8:45 labs and 9:30 Dr. Arbutus Ped.  I have placed POF for labs on this date.

## 2013-07-10 ENCOUNTER — Telehealth: Payer: Self-pay | Admitting: Internal Medicine

## 2013-07-10 NOTE — Telephone Encounter (Signed)
added lab per pof pt already aware

## 2013-07-24 ENCOUNTER — Other Ambulatory Visit: Payer: Medicare Other | Admitting: Lab

## 2013-07-24 ENCOUNTER — Ambulatory Visit: Payer: Medicare Other | Admitting: Physician Assistant

## 2013-07-25 ENCOUNTER — Telehealth: Payer: Self-pay | Admitting: Internal Medicine

## 2013-07-25 NOTE — Telephone Encounter (Signed)
pt came in to r/s missed appt...done °

## 2013-07-26 ENCOUNTER — Ambulatory Visit (HOSPITAL_BASED_OUTPATIENT_CLINIC_OR_DEPARTMENT_OTHER): Payer: Medicare Other | Admitting: Internal Medicine

## 2013-07-26 ENCOUNTER — Telehealth: Payer: Self-pay | Admitting: Internal Medicine

## 2013-07-26 ENCOUNTER — Other Ambulatory Visit (HOSPITAL_BASED_OUTPATIENT_CLINIC_OR_DEPARTMENT_OTHER): Payer: Medicare Other | Admitting: Lab

## 2013-07-26 ENCOUNTER — Telehealth: Payer: Self-pay | Admitting: *Deleted

## 2013-07-26 ENCOUNTER — Encounter: Payer: Self-pay | Admitting: Internal Medicine

## 2013-07-26 VITALS — BP 122/55 | HR 105 | Temp 98.2°F | Resp 18 | Ht 61.0 in | Wt 111.2 lb

## 2013-07-26 DIAGNOSIS — M899 Disorder of bone, unspecified: Secondary | ICD-10-CM

## 2013-07-26 DIAGNOSIS — C3492 Malignant neoplasm of unspecified part of left bronchus or lung: Secondary | ICD-10-CM

## 2013-07-26 DIAGNOSIS — C3410 Malignant neoplasm of upper lobe, unspecified bronchus or lung: Secondary | ICD-10-CM

## 2013-07-26 LAB — COMPREHENSIVE METABOLIC PANEL (CC13)
Alkaline Phosphatase: 71 U/L (ref 40–150)
Anion Gap: 9 mEq/L (ref 3–11)
BUN: 11.1 mg/dL (ref 7.0–26.0)
CO2: 31 mEq/L — ABNORMAL HIGH (ref 22–29)
Chloride: 103 mEq/L (ref 98–109)
Creatinine: 0.9 mg/dL (ref 0.6–1.1)
Glucose: 141 mg/dl — ABNORMAL HIGH (ref 70–140)
Total Bilirubin: 0.72 mg/dL (ref 0.20–1.20)
Total Protein: 7.5 g/dL (ref 6.4–8.3)

## 2013-07-26 LAB — CBC WITH DIFFERENTIAL/PLATELET
Basophils Absolute: 0 10*3/uL (ref 0.0–0.1)
EOS%: 2.5 % (ref 0.0–7.0)
Eosinophils Absolute: 0.2 10*3/uL (ref 0.0–0.5)
HCT: 36.2 % (ref 34.8–46.6)
HGB: 11.6 g/dL (ref 11.6–15.9)
LYMPH%: 17.6 % (ref 14.0–49.7)
MCH: 29.7 pg (ref 25.1–34.0)
MCV: 92.6 fL (ref 79.5–101.0)
MONO%: 7.2 % (ref 0.0–14.0)
NEUT#: 5.2 10*3/uL (ref 1.5–6.5)
NEUT%: 72.3 % (ref 38.4–76.8)
Platelets: 71 10*3/uL — ABNORMAL LOW (ref 145–400)
RBC: 3.91 10*6/uL (ref 3.70–5.45)
RDW: 14.4 % (ref 11.2–14.5)
nRBC: 0 % (ref 0–0)

## 2013-07-26 MED ORDER — MIRTAZAPINE 15 MG PO TABS: 15.0000 mg | ORAL_TABLET | Freq: Every day | ORAL | Status: DC

## 2013-07-26 NOTE — Telephone Encounter (Signed)
Per staff message and POF I have scheduled appts.  JMW  

## 2013-07-26 NOTE — Progress Notes (Signed)
[facility] [phone]   [phone]  OFFICE PROGRESS NOTE  MCNEILL,[name], MD [address] [place] [address]  DIAGNOSIS: Unresectable Stage IIB (T3, N0, M0) non-small cell lung cancer, poorly differentiated squamous cell carcinoma diagnosed in June of 2014.   PRIOR THERAPY: Concurrent chemoradiation with weekly carboplatin for AUC of 2 and paclitaxel 45 mg/M2. Status post 5 cycles. First cycle was given on 04/09/2013. Last dose was given on 05/21/2013 with partial response.   CURRENT THERAPY: Consolidation chemotherapy with carboplatin for AUC of 5 and paclitaxel 175 mg/M2 every 3 weeks with Neulasta support. First dose on 07/31/2013  CHEMOTHERAPY INTENT: Control/curative  CURRENT # OF CHEMOTHERAPY CYCLES: 1 CURRENT ANTIEMETICS: Zofran, dexamethasone and Compazine  CURRENT SMOKING STATUS: Former smoker  ORAL CHEMOTHERAPY AND CONSENT: None  CURRENT BISPHOSPHONATES USE: None  PAIN MANAGEMENT: 0/10  NARCOTICS INDUCED CONSTIPATION: None  LIVING WILL AND CODE STATUS: Full code   INTERVAL HISTORY: Chelsea Cruz 72 y.o. female returns to the clinic today for followup visit accompanied by a friend. The patient is feeling fine today with no specific complaints except for mild anxiety and depression as well as fatigue. She was seen recently by Dr. Tyrone Sage for evaluation of surgical resection of her residual tumor after good response of the concurrent chemoradiation. Unfortunately the patient was not a surgical candidate for resection specialty with the tumor involving both the upper and lower lobe of the left lung and that require pneumonectomy in a patient with marginal PFTs. Dr. Tyrone Sage referred the patient back to me today for evaluation and recommendation of further treatment. She denied having any significant chest pain, shortness breath, cough or hemoptysis. The patient denied having any nausea or vomiting. She denied having any fever or  chills.  MEDICAL HISTORY: Past Medical History  Diagnosis Date  . Nodule of left lung   . Hypertension   . Nicotine addiction   . Osteopenia   . Osteomalacia   . Impaired fasting glucose   . Hypercholesteremia   . Depression   . Lung cancer     ALLERGIES:  is allergic to penicillins.  MEDICATIONS:  Current Outpatient Prescriptions  Medication Sig Dispense Refill  . alendronate (FOSAMAX) 70 MG tablet Take 70 mg by mouth every 7 (seven) days.       . cholecalciferol (VITAMIN D) 1000 UNITS tablet Take 3,000 Units by mouth daily. Takes 3 capsules      . ferrous sulfate 325 (65 FE) MG tablet Take 325 mg by mouth daily with breakfast.      . prochlorperazine (COMPAZINE) 10 MG tablet Take 1 tablet (10 mg total) by mouth every 6 (six) hours as needed (for nausea and vomiting).  30 tablet  1  . amLODipine (NORVASC) 2.5 MG tablet 2.5 mg daily.       Marland Kitchen lisinopril (PRINIVIL,ZESTRIL) 10 MG tablet Take 10 mg by mouth daily.        No current facility-administered medications for this visit.    SURGICAL HISTORY:  Past Surgical History  Procedure Laterality Date  . Cholecystectomy    . Right oophorectomy      1969    REVIEW OF SYSTEMS:  Constitutional: positive for fatigue Eyes: negative Ears, nose, mouth, throat, and face: negative Respiratory: negative Cardiovascular: negative Gastrointestinal: negative Genitourinary:negative Integument/breast: negative Hematologic/lymphatic: negative Musculoskeletal:negative Neurological: negative Behavioral/Psych: positive for anxiety and depression Endocrine: negative Allergic/Immunologic: negative   PHYSICAL EXAMINATION: General appearance: alert, cooperative and no distress Head: Normocephalic, without  obvious abnormality, atraumatic Neck: no adenopathy, no JVD, supple, symmetrical, trachea midline and thyroid not enlarged, symmetric, no tenderness/mass/nodules Lymph nodes: Cervical, supraclavicular, and axillary nodes normal. Resp:  clear to auscultation bilaterally Back: symmetric, no curvature. ROM normal. No CVA tenderness. Cardio: regular rate and rhythm, S1, S2 normal, no murmur, click, rub or gallop GI: soft, non-tender; bowel sounds normal; no masses,  no organomegaly Extremities: extremities normal, atraumatic, no cyanosis or edema Neurologic: Alert and oriented X 3, normal strength and tone. Normal symmetric reflexes. Normal coordination and gait  ECOG PERFORMANCE STATUS: 1 - Symptomatic but completely ambulatory  Blood pressure 122/55, pulse 105, temperature 98.2 F (36.8 C), temperature source Oral, resp. rate 18, height 5\' 1"  (1.549 m), weight 111 lb 3.2 oz (50.44 kg), SpO2 97.00%.  LABORATORY DATA: Lab Results  Component Value Date   WBC 7.2 07/26/2013   HGB 11.6 07/26/2013   HCT 36.2 07/26/2013   MCV 92.6 07/26/2013   PLT 71* 07/26/2013      Chemistry      Component Value Date/Time   NA 143 07/26/2013 1059   K 4.0 07/26/2013 1059   CL 98 04/09/2013 1114   CO2 31* 07/26/2013 1059   BUN 11.1 07/26/2013 1059   CREATININE 0.9 07/26/2013 1059   CREATININE 0.79 03/16/2013 1500      Component Value Date/Time   CALCIUM 10.3 07/26/2013 1059   ALKPHOS 71 07/26/2013 1059   AST 14 07/26/2013 1059   ALT 10 07/26/2013 1059   BILITOT 0.72 07/26/2013 1059       RADIOGRAPHIC STUDIES: No results found.  ASSESSMENT AND PLAN: This is a very pleasant 72 years old white female with unresectable stage IIb non-small cell lung cancer status post concurrent chemoradiation and she is here today for evaluation and discussion of her other treatment options. I have a lengthy discussion with the patient today about her condition. I gave the patient 2 options, one is to continue her on observation and close monitoring versus proceeding with 3 more cycles of consolidation chemotherapy with carboplatin for AUC of 5 and paclitaxel 175 mg/M2 every 3 weeks with Neulasta support. I discussed with the patient the adverse effect of this  treatment including but not limited to alopecia, myelosuppression, nausea and vomiting, peripheral neuropathy, liver or renal dysfunction. The patient would like to consider the systemic chemotherapy therapy. I will arrange for her to receive the first cycle of this treatment on 07/31/2013. For depression, I will start the patient on Remeron 15 mg by mouth daily and I may increase the dose to 30 mg as tolerated The patient would come back for followup visit in 3 weeks with the start of cycle #2 of her chemotherapy. She was advised to call immediately if she has any concerning symptoms in the interval.  The patient voices understanding of current disease status and treatment options and is in agreement with the current care plan.  All questions were answered. The patient knows to call the clinic with any problems, questions or concerns. We can certainly see the patient much sooner if necessary.  I spent 20 minutes counseling the patient face to face. The total time spent in the appointment was 30 minutes.

## 2013-07-26 NOTE — Telephone Encounter (Signed)
gv and printed appt sched and avs for pt for OCT and NOV...emaield MW to add tx °

## 2013-07-27 ENCOUNTER — Other Ambulatory Visit: Payer: Self-pay | Admitting: Medical Oncology

## 2013-07-27 NOTE — Telephone Encounter (Signed)
I called in remeron per Dr Arbutus Ped and pt notified/

## 2013-07-28 NOTE — Patient Instructions (Signed)
CURRENT THERAPY: Consolidation chemotherapy with carboplatin for AUC of 5 and paclitaxel 175 mg/M2 every 3 weeks with Neulasta support. First dose on 07/31/2013  CHEMOTHERAPY INTENT: Control/curative  CURRENT # OF CHEMOTHERAPY CYCLES: 1  CURRENT ANTIEMETICS: Zofran, dexamethasone and Compazine  CURRENT SMOKING STATUS: Former smoker  ORAL CHEMOTHERAPY AND CONSENT: None  CURRENT BISPHOSPHONATES USE: None  PAIN MANAGEMENT: 0/10  NARCOTICS INDUCED CONSTIPATION: None  LIVING WILL AND CODE STATUS: Full code

## 2013-07-30 ENCOUNTER — Telehealth: Payer: Self-pay | Admitting: Internal Medicine

## 2013-07-30 ENCOUNTER — Telehealth: Payer: Self-pay | Admitting: *Deleted

## 2013-07-30 NOTE — Telephone Encounter (Signed)
lvm for pt regarding to time change for 10.14.14 appt...emailed MW to add tx

## 2013-07-30 NOTE — Telephone Encounter (Signed)
Per staff message and POF I have scheduled appts.  JMW  

## 2013-07-31 ENCOUNTER — Other Ambulatory Visit: Payer: Medicare Other | Admitting: Lab

## 2013-07-31 ENCOUNTER — Ambulatory Visit: Payer: Medicare Other

## 2013-07-31 ENCOUNTER — Other Ambulatory Visit: Payer: Self-pay | Admitting: Internal Medicine

## 2013-07-31 ENCOUNTER — Other Ambulatory Visit (HOSPITAL_BASED_OUTPATIENT_CLINIC_OR_DEPARTMENT_OTHER): Payer: Medicare Other | Admitting: Lab

## 2013-07-31 DIAGNOSIS — C3410 Malignant neoplasm of upper lobe, unspecified bronchus or lung: Secondary | ICD-10-CM

## 2013-07-31 DIAGNOSIS — C3492 Malignant neoplasm of unspecified part of left bronchus or lung: Secondary | ICD-10-CM

## 2013-07-31 LAB — CBC WITH DIFFERENTIAL/PLATELET
BASO%: 0.5 % (ref 0.0–2.0)
Eosinophils Absolute: 0.2 10*3/uL (ref 0.0–0.5)
HCT: 36.3 % (ref 34.8–46.6)
LYMPH%: 16.8 % (ref 14.0–49.7)
MCHC: 32.2 g/dL (ref 31.5–36.0)
MONO#: 0.5 10*3/uL (ref 0.1–0.9)
NEUT#: 5.6 10*3/uL (ref 1.5–6.5)
NEUT%: 72.9 % (ref 38.4–76.8)
Platelets: 66 10*3/uL — ABNORMAL LOW (ref 145–400)
RDW: 13.9 % (ref 11.2–14.5)
WBC: 7.6 10*3/uL (ref 3.9–10.3)
lymph#: 1.3 10*3/uL (ref 0.9–3.3)
nRBC: 0 % (ref 0–0)

## 2013-08-01 ENCOUNTER — Ambulatory Visit: Payer: Medicare Other

## 2013-08-07 ENCOUNTER — Telehealth: Payer: Self-pay | Admitting: *Deleted

## 2013-08-07 ENCOUNTER — Telehealth: Payer: Self-pay | Admitting: Internal Medicine

## 2013-08-07 ENCOUNTER — Other Ambulatory Visit: Payer: Self-pay | Admitting: Physician Assistant

## 2013-08-07 ENCOUNTER — Ambulatory Visit: Payer: Medicare Other

## 2013-08-07 ENCOUNTER — Other Ambulatory Visit: Payer: Medicare Other

## 2013-08-07 ENCOUNTER — Other Ambulatory Visit (HOSPITAL_BASED_OUTPATIENT_CLINIC_OR_DEPARTMENT_OTHER): Payer: Medicare Other | Admitting: Lab

## 2013-08-07 DIAGNOSIS — C3410 Malignant neoplasm of upper lobe, unspecified bronchus or lung: Secondary | ICD-10-CM

## 2013-08-07 DIAGNOSIS — C3492 Malignant neoplasm of unspecified part of left bronchus or lung: Secondary | ICD-10-CM

## 2013-08-07 LAB — CBC WITH DIFFERENTIAL/PLATELET
Basophils Absolute: 0 10*3/uL (ref 0.0–0.1)
Eosinophils Absolute: 0.4 10*3/uL (ref 0.0–0.5)
HCT: 34.9 % (ref 34.8–46.6)
HGB: 11.3 g/dL — ABNORMAL LOW (ref 11.6–15.9)
MCV: 90.2 fL (ref 79.5–101.0)
MONO%: 7 % (ref 0.0–14.0)
NEUT#: 5.7 10*3/uL (ref 1.5–6.5)
NEUT%: 72.1 % (ref 38.4–76.8)
RDW: 13.7 % (ref 11.2–14.5)
WBC: 7.8 10*3/uL (ref 3.9–10.3)
lymph#: 1.2 10*3/uL (ref 0.9–3.3)

## 2013-08-07 NOTE — Telephone Encounter (Signed)
gv and printed appt sched.Marland KitchenMarland KitchenPer AJ appt should be added to nxt week due to not having tx today

## 2013-08-07 NOTE — Telephone Encounter (Signed)
Per staff message I have moved appt from today to next week  

## 2013-08-07 NOTE — Progress Notes (Signed)
Chemo to be held today per Karrie Doffing PA.  Platelet count-89.  Results reviewed with patient.  Patient has no questions at this time.  Pt will return as scheduled.

## 2013-08-08 ENCOUNTER — Ambulatory Visit: Payer: Medicare Other

## 2013-08-14 ENCOUNTER — Ambulatory Visit (HOSPITAL_BASED_OUTPATIENT_CLINIC_OR_DEPARTMENT_OTHER): Payer: Medicare Other

## 2013-08-14 ENCOUNTER — Other Ambulatory Visit (HOSPITAL_BASED_OUTPATIENT_CLINIC_OR_DEPARTMENT_OTHER): Payer: Medicare Other

## 2013-08-14 ENCOUNTER — Other Ambulatory Visit: Payer: Self-pay | Admitting: Internal Medicine

## 2013-08-14 ENCOUNTER — Telehealth: Payer: Self-pay | Admitting: Internal Medicine

## 2013-08-14 DIAGNOSIS — C3410 Malignant neoplasm of upper lobe, unspecified bronchus or lung: Secondary | ICD-10-CM

## 2013-08-14 DIAGNOSIS — Z5111 Encounter for antineoplastic chemotherapy: Secondary | ICD-10-CM

## 2013-08-14 DIAGNOSIS — C3492 Malignant neoplasm of unspecified part of left bronchus or lung: Secondary | ICD-10-CM

## 2013-08-14 LAB — CBC WITH DIFFERENTIAL/PLATELET
Basophils Absolute: 0.1 10*3/uL (ref 0.0–0.1)
EOS%: 4.8 % (ref 0.0–7.0)
Eosinophils Absolute: 0.4 10*3/uL (ref 0.0–0.5)
HCT: 35.3 % (ref 34.8–46.6)
HGB: 11.1 g/dL — ABNORMAL LOW (ref 11.6–15.9)
LYMPH%: 17.9 % (ref 14.0–49.7)
MCH: 29 pg (ref 25.1–34.0)
MONO#: 0.7 10*3/uL (ref 0.1–0.9)
NEUT#: 5.8 10*3/uL (ref 1.5–6.5)
NEUT%: 68.6 % (ref 38.4–76.8)
Platelets: 91 10*3/uL — ABNORMAL LOW (ref 145–400)
RDW: 13.6 % (ref 11.2–14.5)
WBC: 8.5 10*3/uL (ref 3.9–10.3)
lymph#: 1.5 10*3/uL (ref 0.9–3.3)

## 2013-08-14 LAB — COMPREHENSIVE METABOLIC PANEL (CC13)
AST: 18 U/L (ref 5–34)
Albumin: 3.1 g/dL — ABNORMAL LOW (ref 3.5–5.0)
BUN: 15.4 mg/dL (ref 7.0–26.0)
Calcium: 9.4 mg/dL (ref 8.4–10.4)
Chloride: 108 mEq/L (ref 98–109)
Glucose: 78 mg/dl (ref 70–140)
Potassium: 4.4 mEq/L (ref 3.5–5.1)
Sodium: 142 mEq/L (ref 136–145)
Total Bilirubin: 0.48 mg/dL (ref 0.20–1.20)
Total Protein: 6.8 g/dL (ref 6.4–8.3)

## 2013-08-14 MED ORDER — PACLITAXEL CHEMO INJECTION 300 MG/50ML
150.0000 mg/m2 | Freq: Once | INTRAVENOUS | Status: AC
  Administered 2013-08-14: 222 mg via INTRAVENOUS
  Filled 2013-08-14: qty 37

## 2013-08-14 MED ORDER — SODIUM CHLORIDE 0.9 % IV SOLN
257.2000 mg | Freq: Once | INTRAVENOUS | Status: AC
  Administered 2013-08-14: 260 mg via INTRAVENOUS
  Filled 2013-08-14: qty 26

## 2013-08-14 MED ORDER — DEXAMETHASONE SODIUM PHOSPHATE 20 MG/5ML IJ SOLN
INTRAMUSCULAR | Status: AC
  Filled 2013-08-14: qty 5

## 2013-08-14 MED ORDER — ONDANSETRON 16 MG/50ML IVPB (CHCC)
16.0000 mg | Freq: Once | INTRAVENOUS | Status: AC
  Administered 2013-08-14: 16 mg via INTRAVENOUS

## 2013-08-14 MED ORDER — DIPHENHYDRAMINE HCL 50 MG/ML IJ SOLN
INTRAMUSCULAR | Status: AC
  Filled 2013-08-14: qty 1

## 2013-08-14 MED ORDER — DEXAMETHASONE SODIUM PHOSPHATE 20 MG/5ML IJ SOLN
20.0000 mg | Freq: Once | INTRAMUSCULAR | Status: AC
  Administered 2013-08-14: 20 mg via INTRAVENOUS

## 2013-08-14 MED ORDER — DIPHENHYDRAMINE HCL 50 MG/ML IJ SOLN
50.0000 mg | Freq: Once | INTRAMUSCULAR | Status: AC
  Administered 2013-08-14: 50 mg via INTRAVENOUS

## 2013-08-14 MED ORDER — FAMOTIDINE IN NACL 20-0.9 MG/50ML-% IV SOLN
INTRAVENOUS | Status: AC
  Filled 2013-08-14: qty 50

## 2013-08-14 MED ORDER — SODIUM CHLORIDE 0.9 % IV SOLN
Freq: Once | INTRAVENOUS | Status: AC
  Administered 2013-08-14: 11:00:00 via INTRAVENOUS

## 2013-08-14 MED ORDER — ONDANSETRON 16 MG/50ML IVPB (CHCC)
INTRAVENOUS | Status: AC
  Filled 2013-08-14: qty 16

## 2013-08-14 MED ORDER — FAMOTIDINE IN NACL 20-0.9 MG/50ML-% IV SOLN
20.0000 mg | Freq: Once | INTRAVENOUS | Status: AC
  Administered 2013-08-14: 20 mg via INTRAVENOUS

## 2013-08-14 NOTE — Patient Instructions (Signed)
[facility] Discharge Instructions for Patients Receiving Chemotherapy  Today you received the following chemotherapy agents: Carboplatin, Taxol   To help prevent nausea and vomiting after your treatment, we encourage you to take your nausea medication as prescribed.    If you develop nausea and vomiting that is not controlled by your nausea medication, call the clinic.   BELOW ARE SYMPTOMS THAT SHOULD BE REPORTED IMMEDIATELY:  *FEVER GREATER THAN 100.5 F  *CHILLS WITH OR WITHOUT FEVER  NAUSEA AND VOMITING THAT IS NOT CONTROLLED WITH YOUR NAUSEA MEDICATION  *UNUSUAL SHORTNESS OF BREATH  *UNUSUAL BRUISING OR BLEEDING  TENDERNESS IN MOUTH AND THROAT WITH OR WITHOUT PRESENCE OF ULCERS  *URINARY PROBLEMS  *BOWEL PROBLEMS  UNUSUAL RASH Items with * indicate a potential emergency and should be followed up as soon as possible.  Feel free to call the clinic you have any questions or concerns. The clinic phone number is [phone].    

## 2013-08-14 NOTE — Progress Notes (Signed)
Ok to treat patient today with platelet count of 91 per Dr. Arbutus Ped. Angelena Form, RN

## 2013-08-14 NOTE — Telephone Encounter (Signed)
gv andprinted appt sched and avs for pt....sed added tx.

## 2013-08-15 ENCOUNTER — Ambulatory Visit (HOSPITAL_BASED_OUTPATIENT_CLINIC_OR_DEPARTMENT_OTHER): Payer: Medicare Other

## 2013-08-15 DIAGNOSIS — C3410 Malignant neoplasm of upper lobe, unspecified bronchus or lung: Secondary | ICD-10-CM

## 2013-08-15 DIAGNOSIS — Z5189 Encounter for other specified aftercare: Secondary | ICD-10-CM

## 2013-08-15 MED ORDER — PEGFILGRASTIM INJECTION 6 MG/0.6ML
6.0000 mg | Freq: Once | SUBCUTANEOUS | Status: AC
  Administered 2013-08-15: 6 mg via SUBCUTANEOUS
  Filled 2013-08-15: qty 0.6

## 2013-08-15 NOTE — Addendum Note (Signed)
Encounter addended by: Oneita Hurt, MD on: 08/15/2013 12:19 PM<BR>     Documentation filed: Notes Section

## 2013-08-20 ENCOUNTER — Other Ambulatory Visit: Payer: Self-pay | Admitting: *Deleted

## 2013-08-20 NOTE — Progress Notes (Signed)
Pt was scheduled for lab, f/u chemo tomorrow 11/4.  Pt only needs lab appt tomorrow because she had chemotherapy last week.  Lest msg with information and that scheduled would be in touch regarding adding a f/u appt to lab and chemo on 11/18.  SLJ

## 2013-08-21 ENCOUNTER — Other Ambulatory Visit (HOSPITAL_BASED_OUTPATIENT_CLINIC_OR_DEPARTMENT_OTHER): Payer: Medicare Other

## 2013-08-21 ENCOUNTER — Ambulatory Visit: Payer: Medicare Other

## 2013-08-21 ENCOUNTER — Ambulatory Visit: Payer: Medicare Other | Admitting: Physician Assistant

## 2013-08-21 ENCOUNTER — Telehealth: Payer: Self-pay | Admitting: Physician Assistant

## 2013-08-21 DIAGNOSIS — C3410 Malignant neoplasm of upper lobe, unspecified bronchus or lung: Secondary | ICD-10-CM

## 2013-08-21 DIAGNOSIS — C3492 Malignant neoplasm of unspecified part of left bronchus or lung: Secondary | ICD-10-CM

## 2013-08-21 LAB — CBC WITH DIFFERENTIAL/PLATELET
EOS%: 15.3 % — ABNORMAL HIGH (ref 0.0–7.0)
Eosinophils Absolute: 1 10*3/uL — ABNORMAL HIGH (ref 0.0–0.5)
HCT: 37.5 % (ref 34.8–46.6)
MCV: 89.7 fL (ref 79.5–101.0)
MONO#: 1.2 10*3/uL — ABNORMAL HIGH (ref 0.1–0.9)
NEUT#: 2.7 10*3/uL (ref 1.5–6.5)
RBC: 4.18 10*6/uL (ref 3.70–5.45)
RDW: 13.7 % (ref 11.2–14.5)
WBC: 6.5 10*3/uL (ref 3.9–10.3)
lymph#: 1.6 10*3/uL (ref 0.9–3.3)

## 2013-08-21 LAB — COMPREHENSIVE METABOLIC PANEL (CC13)
Albumin: 3.7 g/dL (ref 3.5–5.0)
Alkaline Phosphatase: 91 U/L (ref 40–150)
Anion Gap: 11 mEq/L (ref 3–11)
CO2: 25 mEq/L (ref 22–29)
Glucose: 92 mg/dl (ref 70–140)
Potassium: 5 mEq/L (ref 3.5–5.1)
Sodium: 140 mEq/L (ref 136–145)
Total Bilirubin: 0.51 mg/dL (ref 0.20–1.20)
Total Protein: 7.7 g/dL (ref 6.4–8.3)

## 2013-08-21 NOTE — Telephone Encounter (Signed)
sw dgt Alinda Deem of 11/18 changes in appt times and add on AJ visit shh

## 2013-08-22 ENCOUNTER — Ambulatory Visit: Payer: Medicare Other

## 2013-08-23 ENCOUNTER — Other Ambulatory Visit: Payer: Self-pay

## 2013-08-23 ENCOUNTER — Encounter: Payer: Self-pay | Admitting: Internal Medicine

## 2013-08-23 NOTE — Progress Notes (Signed)
Put mother's fmla form on nurse's desk. °

## 2013-08-28 ENCOUNTER — Other Ambulatory Visit (HOSPITAL_BASED_OUTPATIENT_CLINIC_OR_DEPARTMENT_OTHER): Payer: Medicare Other

## 2013-08-28 DIAGNOSIS — C3492 Malignant neoplasm of unspecified part of left bronchus or lung: Secondary | ICD-10-CM

## 2013-08-28 DIAGNOSIS — C3410 Malignant neoplasm of upper lobe, unspecified bronchus or lung: Secondary | ICD-10-CM

## 2013-08-28 LAB — COMPREHENSIVE METABOLIC PANEL (CC13)
AST: 16 U/L (ref 5–34)
Albumin: 3.4 g/dL — ABNORMAL LOW (ref 3.5–5.0)
Anion Gap: 9 mEq/L (ref 3–11)
CO2: 25 mEq/L (ref 22–29)
Calcium: 9.9 mg/dL (ref 8.4–10.4)
Chloride: 105 mEq/L (ref 98–109)
Creatinine: 0.8 mg/dL (ref 0.6–1.1)
Glucose: 142 mg/dl — ABNORMAL HIGH (ref 70–140)
Total Bilirubin: 0.42 mg/dL (ref 0.20–1.20)
Total Protein: 6.9 g/dL (ref 6.4–8.3)

## 2013-08-28 LAB — CBC WITH DIFFERENTIAL/PLATELET
BASO%: 1.2 % (ref 0.0–2.0)
EOS%: 4.6 % (ref 0.0–7.0)
HCT: 34.1 % — ABNORMAL LOW (ref 34.8–46.6)
LYMPH%: 17.4 % (ref 14.0–49.7)
MCH: 29 pg (ref 25.1–34.0)
MCHC: 32.3 g/dL (ref 31.5–36.0)
MONO#: 0.6 10*3/uL (ref 0.1–0.9)
Platelets: 192 10*3/uL (ref 145–400)
RBC: 3.79 10*6/uL (ref 3.70–5.45)
WBC: 8.6 10*3/uL (ref 3.9–10.3)
lymph#: 1.5 10*3/uL (ref 0.9–3.3)

## 2013-09-04 ENCOUNTER — Other Ambulatory Visit (HOSPITAL_BASED_OUTPATIENT_CLINIC_OR_DEPARTMENT_OTHER): Payer: Medicare Other | Admitting: Lab

## 2013-09-04 ENCOUNTER — Ambulatory Visit (HOSPITAL_BASED_OUTPATIENT_CLINIC_OR_DEPARTMENT_OTHER): Payer: Medicare Other

## 2013-09-04 ENCOUNTER — Ambulatory Visit (HOSPITAL_BASED_OUTPATIENT_CLINIC_OR_DEPARTMENT_OTHER): Payer: Medicare Other | Admitting: Physician Assistant

## 2013-09-04 ENCOUNTER — Encounter: Payer: Self-pay | Admitting: Physician Assistant

## 2013-09-04 ENCOUNTER — Other Ambulatory Visit: Payer: Medicare Other | Admitting: Lab

## 2013-09-04 ENCOUNTER — Telehealth: Payer: Self-pay | Admitting: Internal Medicine

## 2013-09-04 DIAGNOSIS — M2550 Pain in unspecified joint: Secondary | ICD-10-CM

## 2013-09-04 DIAGNOSIS — C3492 Malignant neoplasm of unspecified part of left bronchus or lung: Secondary | ICD-10-CM

## 2013-09-04 DIAGNOSIS — Z5111 Encounter for antineoplastic chemotherapy: Secondary | ICD-10-CM

## 2013-09-04 DIAGNOSIS — C3410 Malignant neoplasm of upper lobe, unspecified bronchus or lung: Secondary | ICD-10-CM

## 2013-09-04 DIAGNOSIS — F329 Major depressive disorder, single episode, unspecified: Secondary | ICD-10-CM

## 2013-09-04 LAB — CBC WITH DIFFERENTIAL/PLATELET
BASO%: 0.7 % (ref 0.0–2.0)
Basophils Absolute: 0.1 10*3/uL (ref 0.0–0.1)
EOS%: 2.2 % (ref 0.0–7.0)
Eosinophils Absolute: 0.2 10*3/uL (ref 0.0–0.5)
HCT: 35.5 % (ref 34.8–46.6)
HGB: 11.2 g/dL — ABNORMAL LOW (ref 11.6–15.9)
LYMPH%: 18.9 % (ref 14.0–49.7)
MCH: 28.7 pg (ref 25.1–34.0)
MCHC: 31.5 g/dL (ref 31.5–36.0)
MCV: 91 fL (ref 79.5–101.0)
MONO#: 0.9 10*3/uL (ref 0.1–0.9)
MONO%: 12.1 % (ref 0.0–14.0)
NEUT#: 4.8 10*3/uL (ref 1.5–6.5)
NEUT%: 66.1 % (ref 38.4–76.8)
Platelets: 165 10*3/uL (ref 145–400)
RBC: 3.9 10*6/uL (ref 3.70–5.45)
RDW: 15 % — ABNORMAL HIGH (ref 11.2–14.5)
WBC: 7.2 10*3/uL (ref 3.9–10.3)
lymph#: 1.4 10*3/uL (ref 0.9–3.3)
nRBC: 0 % (ref 0–0)

## 2013-09-04 LAB — COMPREHENSIVE METABOLIC PANEL (CC13)
ALT: 20 U/L (ref 0–55)
AST: 18 U/L (ref 5–34)
Albumin: 3.9 g/dL (ref 3.5–5.0)
Alkaline Phosphatase: 92 U/L (ref 40–150)
Anion Gap: 10 mEq/L (ref 3–11)
BUN: 18.5 mg/dL (ref 7.0–26.0)
CO2: 26 mEq/L (ref 22–29)
Calcium: 10.5 mg/dL — ABNORMAL HIGH (ref 8.4–10.4)
Chloride: 105 mEq/L (ref 98–109)
Creatinine: 0.8 mg/dL (ref 0.6–1.1)
Glucose: 96 mg/dl (ref 70–140)
Potassium: 4.2 mEq/L (ref 3.5–5.1)
Sodium: 141 mEq/L (ref 136–145)
Total Bilirubin: 0.54 mg/dL (ref 0.20–1.20)
Total Protein: 7.8 g/dL (ref 6.4–8.3)

## 2013-09-04 MED ORDER — PACLITAXEL CHEMO INJECTION 300 MG/50ML
150.0000 mg/m2 | Freq: Once | INTRAVENOUS | Status: AC
  Administered 2013-09-04: 228 mg via INTRAVENOUS
  Filled 2013-09-04: qty 38

## 2013-09-04 MED ORDER — DIPHENHYDRAMINE HCL 50 MG/ML IJ SOLN
50.0000 mg | Freq: Once | INTRAMUSCULAR | Status: AC
  Administered 2013-09-04: 50 mg via INTRAVENOUS

## 2013-09-04 MED ORDER — PACLITAXEL CHEMO INJECTION 300 MG/50ML: 175.0000 mg/m2 | Freq: Once | INTRAVENOUS | Status: DC

## 2013-09-04 MED ORDER — DEXAMETHASONE SODIUM PHOSPHATE 20 MG/5ML IJ SOLN
INTRAMUSCULAR | Status: AC
  Filled 2013-09-04: qty 5

## 2013-09-04 MED ORDER — FAMOTIDINE IN NACL 20-0.9 MG/50ML-% IV SOLN
INTRAVENOUS | Status: AC
  Filled 2013-09-04: qty 50

## 2013-09-04 MED ORDER — SODIUM CHLORIDE 0.9 % IV SOLN
Freq: Once | INTRAVENOUS | Status: AC
  Administered 2013-09-04: 13:00:00 via INTRAVENOUS

## 2013-09-04 MED ORDER — ONDANSETRON 16 MG/50ML IVPB (CHCC)
16.0000 mg | Freq: Once | INTRAVENOUS | Status: AC
  Administered 2013-09-04: 16 mg via INTRAVENOUS

## 2013-09-04 MED ORDER — SODIUM CHLORIDE 0.9 % IV SOLN: 321.5000 mg | Freq: Once | INTRAVENOUS | Status: DC

## 2013-09-04 MED ORDER — INFLUENZA VAC SPLIT QUAD 0.5 ML IM SUSP
0.5000 mL | Freq: Once | INTRAMUSCULAR | Status: DC
  Filled 2013-09-04: qty 0.5

## 2013-09-04 MED ORDER — SODIUM CHLORIDE 0.9 % IJ SOLN
10.0000 mL | INTRAMUSCULAR | Status: DC | PRN
  Filled 2013-09-04: qty 10

## 2013-09-04 MED ORDER — ONDANSETRON 16 MG/50ML IVPB (CHCC)
INTRAVENOUS | Status: AC
  Filled 2013-09-04: qty 16

## 2013-09-04 MED ORDER — DIPHENHYDRAMINE HCL 50 MG/ML IJ SOLN
INTRAMUSCULAR | Status: AC
  Filled 2013-09-04: qty 1

## 2013-09-04 MED ORDER — FAMOTIDINE IN NACL 20-0.9 MG/50ML-% IV SOLN
20.0000 mg | Freq: Once | INTRAVENOUS | Status: AC
  Administered 2013-09-04: 20 mg via INTRAVENOUS

## 2013-09-04 MED ORDER — SODIUM CHLORIDE 0.9 % IV SOLN
270.0000 mg | Freq: Once | INTRAVENOUS | Status: AC
  Administered 2013-09-04: 270 mg via INTRAVENOUS
  Filled 2013-09-04: qty 27

## 2013-09-04 MED ORDER — DEXAMETHASONE SODIUM PHOSPHATE 20 MG/5ML IJ SOLN
20.0000 mg | Freq: Once | INTRAMUSCULAR | Status: AC
  Administered 2013-09-04: 20 mg via INTRAVENOUS

## 2013-09-04 NOTE — Progress Notes (Addendum)
[facility] [phone]   [phone]  SHARED VISIT PROGRESS NOTE  MCNEILL,[name], MD [address] [place] [address]  DIAGNOSIS: Unresectable Stage IIB (T3, N0, M0) non-small cell lung cancer, poorly differentiated squamous cell carcinoma diagnosed in June of 2014.   PRIOR THERAPY: Concurrent chemoradiation with weekly carboplatin for AUC of 2 and paclitaxel 45 mg/M2. Status post 5 cycles. First cycle was given on 04/09/2013. Last dose was given on 05/21/2013 with partial response.   CURRENT THERAPY: Consolidation chemotherapy with carboplatin for AUC of 4 and paclitaxel 150 mg/M2 every 3 weeks with Neulasta support. First dose given on 07/31/2013. Status post 1 cycle  CHEMOTHERAPY INTENT: Control/curative  CURRENT # OF CHEMOTHERAPY CYCLES: 2 CURRENT ANTIEMETICS: Zofran, dexamethasone and Compazine  CURRENT SMOKING STATUS: Former smoker  ORAL CHEMOTHERAPY AND CONSENT: None  CURRENT BISPHOSPHONATES USE: None  PAIN MANAGEMENT: 0/10  NARCOTICS INDUCED CONSTIPATION: None  LIVING WILL AND CODE STATUS: Full code   INTERVAL HISTORY: CLARITA MCELVAIN 72 y.o. female returns to the clinic today for followup visit accompanied by a friend. She tolerated her first cycle of consolidation chemotherapy with carboplatin and paclitaxel with less port with the exception of a little diarrhea right after chemotherapy that was well-controlled with Imodium and did not occur again. She also had joint and bone pain affecting her ankles and knees and to a lesser extent her hips after her Neulasta injection. The pain lasted for 3-4 days. She try taking Tylenol but ended up taking 1 dose of aspirin that she did find helpful. She did not take Claritin. She requests a flu vaccine today.  She denied having any significant chest pain, shortness breath, cough or hemoptysis. The patient denied having any nausea or vomiting. She denied having any fever or chills. She is  tolerating the Remeron and does not report any significant anxiety or depressive symptoms today.  MEDICAL HISTORY: Past Medical History  Diagnosis Date  . Nodule of left lung   . Hypertension   . Nicotine addiction   . Osteopenia   . Osteomalacia   . Impaired fasting glucose   . Hypercholesteremia   . Depression   . Lung cancer     ALLERGIES:  is allergic to penicillins.  MEDICATIONS:  Current Outpatient Prescriptions  Medication Sig Dispense Refill  . alendronate (FOSAMAX) 70 MG tablet Take 70 mg by mouth every 7 (seven) days.       . cholecalciferol (VITAMIN D) 1000 UNITS tablet Take 3,000 Units by mouth daily. Takes 3 capsules      . ferrous sulfate 325 (65 FE) MG tablet Take 325 mg by mouth daily with breakfast.      . mirtazapine (REMERON) 15 MG tablet Take 1 tablet (15 mg total) by mouth at bedtime.  30 tablet  0  . amLODipine (NORVASC) 2.5 MG tablet 2.5 mg daily.       Marland Kitchen lisinopril (PRINIVIL,ZESTRIL) 10 MG tablet Take 10 mg by mouth daily.       . prochlorperazine (COMPAZINE) 10 MG tablet Take 1 tablet (10 mg total) by mouth every 6 (six) hours as needed (for nausea and vomiting).  30 tablet  1   Current Facility-Administered Medications  Medication Dose Route Frequency Provider Last Rate Last Dose  . influenza vac split quadrivalent PF (FLUARIX) injection 0.5 mL  0.5 mL Intramuscular Once Conni Slipper, PA-C        SURGICAL HISTORY:  Past Surgical History  Procedure Laterality Date  .  Cholecystectomy    . Right oophorectomy      1969    REVIEW OF SYSTEMS:  Constitutional: positive for fatigue Eyes: negative Ears, nose, mouth, throat, and face: negative Respiratory: negative Cardiovascular: negative Gastrointestinal: negative Genitourinary:negative Integument/breast: negative Hematologic/lymphatic: negative Musculoskeletal:negative Neurological: negative Behavioral/Psych: positive for anxiety and depression Endocrine: negative Allergic/Immunologic:  negative   PHYSICAL EXAMINATION: General appearance: alert, cooperative and no distress Head: Normocephalic, without obvious abnormality, atraumatic Neck: no adenopathy, no JVD, supple, symmetrical, trachea midline and thyroid not enlarged, symmetric, no tenderness/mass/nodules Lymph nodes: Cervical, supraclavicular, and axillary nodes normal. Resp: clear to auscultation bilaterally Back: symmetric, no curvature. ROM normal. No CVA tenderness. Cardio: regular rate and rhythm, S1, S2 normal, no murmur, click, rub or gallop GI: soft, non-tender; bowel sounds normal; no masses,  no organomegaly Extremities: extremities normal, atraumatic, no cyanosis or edema Neurologic: Alert and oriented X 3, normal strength and tone. Normal symmetric reflexes. Normal coordination and gait  ECOG PERFORMANCE STATUS: 1 - Symptomatic but completely ambulatory  Blood pressure 132/71, pulse 106, temperature 97.8 F (36.6 C), temperature source Oral, resp. rate 18, height 5\' 1"  (1.549 m), weight 118 lb (53.524 kg).  LABORATORY DATA: Lab Results  Component Value Date   WBC 7.2 09/04/2013   HGB 11.2* 09/04/2013   HCT 35.5 09/04/2013   MCV 91.0 09/04/2013   PLT 165 09/04/2013      Chemistry      Component Value Date/Time   NA 140 08/28/2013 1015   K 4.7 08/28/2013 1015   CL 98 04/09/2013 1114   CO2 25 08/28/2013 1015   BUN 11.7 08/28/2013 1015   CREATININE 0.8 08/28/2013 1015   CREATININE 0.79 03/16/2013 1500      Component Value Date/Time   CALCIUM 9.9 08/28/2013 1015   ALKPHOS 94 08/28/2013 1015   AST 16 08/28/2013 1015   ALT 17 08/28/2013 1015   BILITOT 0.42 08/28/2013 1015       RADIOGRAPHIC STUDIES: No results found.  ASSESSMENT AND PLAN: This is a very pleasant 72 years old white female with unresectable stage IIb non-small cell lung cancer status post concurrent chemoradiation. She's currently being treated with consolidation chemotherapy in the form of carboplatin for an AUC of 4 and  paclitaxel 150 milligrams per meter square given every 3 weeks with Neulasta support. She status post 1 cycle. Overall she tolerated the first cycle of consolidation chemotherapy relatively well with the exception of the pulmonary and some bone and joint pain from the Neulasta. Patient was advised to continue taking Imodium as directed if needed for the diarrhea after chemotherapy. For the bone and joint pain related to the Neulasta injection, the patient was advised to take Claritin and Tylenol and to avoid aspirin. She is to notify us if you need something stronger to deal with the pain associated with his injection. She'll continue with weekly labs as scheduled. She'll followup in 3 weeks prior to cycle #3 of her consolidation chemotherapy with carboplatin and paclitaxel with Neulasta support.    Laural Benes, Signa Cheek E, PA-C   She was advised to call immediately if she has any concerning symptoms in the interval.  The patient voices understanding of current disease status and treatment options and is in agreement with the current care plan.  All questions were answered. The patient knows to call the clinic with any problems, questions or concerns. We can certainly see the patient much sooner if necessary.  ADDENDUM: Hematology/Oncology Attending: I had a face to face encounter with the  patient today. I recommended her care plan. She is a very pleasant 72 years old white female with unresectable stage IIb non-small cell lung cancer who is currently undergoing consolidation chemotherapy with reduced dose carboplatin and paclitaxel, status post 1 cycle. She tolerated the first cycle of her treatment fairly well except for aching pain and few episodes of diarrhea after Neulasta injection. She denied having any significant fever or chills. The patient denied having any nausea or vomiting. I recommended for the patient to proceed with the second cycle of her treatment as scheduled today. She would come back  for followup visit in 3 weeks with the next cycle of her chemotherapy. For depression the patient will continue on Remeron. She was advised to call immediately if she has any concerning symptoms in the interval. Lajuana Matte., MD 09/04/2013

## 2013-09-04 NOTE — Patient Instructions (Signed)
[facility] Discharge Instructions for Patients Receiving Chemotherapy  Today you received the following chemotherapy agents: Carboplatin and Taxol  To help prevent nausea and vomiting after your treatment, we encourage you to take your nausea medication.   If you develop nausea and vomiting that is not controlled by your nausea medication, call the clinic.   BELOW ARE SYMPTOMS THAT SHOULD BE REPORTED IMMEDIATELY:  *FEVER GREATER THAN 100.5 F  *CHILLS WITH OR WITHOUT FEVER  NAUSEA AND VOMITING THAT IS NOT CONTROLLED WITH YOUR NAUSEA MEDICATION  *UNUSUAL SHORTNESS OF BREATH  *UNUSUAL BRUISING OR BLEEDING  TENDERNESS IN MOUTH AND THROAT WITH OR WITHOUT PRESENCE OF ULCERS  *URINARY PROBLEMS  *BOWEL PROBLEMS  UNUSUAL RASH Items with * indicate a potential emergency and should be followed up as soon as possible.  Feel free to call the clinic you have any questions or concerns. The clinic phone number is [phone].

## 2013-09-04 NOTE — Telephone Encounter (Signed)
added wkly lb appts and next tx cycle w/lb and f/u 12/9 and inj 12/10. lmonvm informing pt and pt to get new schedule when she comes back in tomorrow.

## 2013-09-04 NOTE — Patient Instructions (Signed)
Continue weekly labs as scheduled Follow up in 3 weeks, prior to the start of your next scheduled cycle of chemotherapy 

## 2013-09-05 ENCOUNTER — Ambulatory Visit (HOSPITAL_BASED_OUTPATIENT_CLINIC_OR_DEPARTMENT_OTHER): Payer: Medicare Other

## 2013-09-05 ENCOUNTER — Other Ambulatory Visit: Payer: Self-pay | Admitting: Medical Oncology

## 2013-09-05 DIAGNOSIS — C3410 Malignant neoplasm of upper lobe, unspecified bronchus or lung: Secondary | ICD-10-CM

## 2013-09-05 DIAGNOSIS — Z23 Encounter for immunization: Secondary | ICD-10-CM

## 2013-09-05 DIAGNOSIS — Z5189 Encounter for other specified aftercare: Secondary | ICD-10-CM

## 2013-09-05 MED ORDER — INFLUENZA VAC SPLIT QUAD 0.5 ML IM SUSP
0.5000 mL | INTRAMUSCULAR | Status: AC
  Administered 2013-09-05: 0.5 mL via INTRAMUSCULAR
  Filled 2013-09-05: qty 0.5

## 2013-09-05 MED ORDER — PEGFILGRASTIM INJECTION 6 MG/0.6ML
6.0000 mg | Freq: Once | SUBCUTANEOUS | Status: AC
  Administered 2013-09-05: 6 mg via SUBCUTANEOUS
  Filled 2013-09-05: qty 0.6

## 2013-09-05 NOTE — Patient Instructions (Signed)

## 2013-09-11 ENCOUNTER — Other Ambulatory Visit (HOSPITAL_BASED_OUTPATIENT_CLINIC_OR_DEPARTMENT_OTHER): Payer: Medicare Other | Admitting: Lab

## 2013-09-11 DIAGNOSIS — C3490 Malignant neoplasm of unspecified part of unspecified bronchus or lung: Secondary | ICD-10-CM

## 2013-09-11 DIAGNOSIS — C3492 Malignant neoplasm of unspecified part of left bronchus or lung: Secondary | ICD-10-CM

## 2013-09-11 LAB — CBC WITH DIFFERENTIAL/PLATELET
Basophils Absolute: 0.1 10*3/uL (ref 0.0–0.1)
EOS%: 5 % (ref 0.0–7.0)
HGB: 10.5 g/dL — ABNORMAL LOW (ref 11.6–15.9)
LYMPH%: 22.7 % (ref 14.0–49.7)
MCH: 28.5 pg (ref 25.1–34.0)
MCV: 89.1 fL (ref 79.5–101.0)
MONO#: 0.7 10*3/uL (ref 0.1–0.9)
MONO%: 10 % (ref 0.0–14.0)
NEUT#: 4.5 10*3/uL (ref 1.5–6.5)
NEUT%: 61.2 % (ref 38.4–76.8)
Platelets: 154 10*3/uL (ref 145–400)
RBC: 3.69 10*6/uL — ABNORMAL LOW (ref 3.70–5.45)
RDW: 16.5 % — ABNORMAL HIGH (ref 11.2–14.5)

## 2013-09-11 LAB — COMPREHENSIVE METABOLIC PANEL (CC13)
AST: 24 U/L (ref 5–34)
Albumin: 3.6 g/dL (ref 3.5–5.0)
Alkaline Phosphatase: 94 U/L (ref 40–150)
BUN: 17.5 mg/dL (ref 7.0–26.0)
Chloride: 105 mEq/L (ref 98–109)
Creatinine: 0.8 mg/dL (ref 0.6–1.1)
Potassium: 4.4 mEq/L (ref 3.5–5.1)
Total Bilirubin: 0.46 mg/dL (ref 0.20–1.20)

## 2013-09-18 ENCOUNTER — Other Ambulatory Visit (HOSPITAL_BASED_OUTPATIENT_CLINIC_OR_DEPARTMENT_OTHER): Payer: Medicare Other

## 2013-09-18 DIAGNOSIS — C3492 Malignant neoplasm of unspecified part of left bronchus or lung: Secondary | ICD-10-CM

## 2013-09-18 DIAGNOSIS — C3410 Malignant neoplasm of upper lobe, unspecified bronchus or lung: Secondary | ICD-10-CM

## 2013-09-18 LAB — CBC WITH DIFFERENTIAL/PLATELET
Basophils Absolute: 0 10*3/uL (ref 0.0–0.1)
EOS%: 3.3 % (ref 0.0–7.0)
Eosinophils Absolute: 0.5 10*3/uL (ref 0.0–0.5)
HCT: 36.5 % (ref 34.8–46.6)
HGB: 11.5 g/dL — ABNORMAL LOW (ref 11.6–15.9)
LYMPH%: 15.6 % (ref 14.0–49.7)
MCV: 90.8 fL (ref 79.5–101.0)
MONO#: 0.9 10*3/uL (ref 0.1–0.9)
MONO%: 6.3 % (ref 0.0–14.0)
NEUT#: 10.1 10*3/uL — ABNORMAL HIGH (ref 1.5–6.5)
NEUT%: 74.5 % (ref 38.4–76.8)
Platelets: 229 10*3/uL (ref 145–400)
RBC: 4.02 10*6/uL (ref 3.70–5.45)
WBC: 13.5 10*3/uL — ABNORMAL HIGH (ref 3.9–10.3)

## 2013-09-18 LAB — COMPREHENSIVE METABOLIC PANEL (CC13)
ALT: 24 U/L (ref 0–55)
Alkaline Phosphatase: 104 U/L (ref 40–150)
Anion Gap: 15 mEq/L — ABNORMAL HIGH (ref 3–11)
BUN: 17 mg/dL (ref 7.0–26.0)
CO2: 21 mEq/L — ABNORMAL LOW (ref 22–29)
Glucose: 123 mg/dl (ref 70–140)
Sodium: 139 mEq/L (ref 136–145)
Total Bilirubin: 0.55 mg/dL (ref 0.20–1.20)
Total Protein: 7.5 g/dL (ref 6.4–8.3)

## 2013-09-25 ENCOUNTER — Ambulatory Visit (HOSPITAL_BASED_OUTPATIENT_CLINIC_OR_DEPARTMENT_OTHER): Payer: Medicare Other | Admitting: Internal Medicine

## 2013-09-25 ENCOUNTER — Other Ambulatory Visit (HOSPITAL_BASED_OUTPATIENT_CLINIC_OR_DEPARTMENT_OTHER): Payer: Medicare Other | Admitting: Lab

## 2013-09-25 ENCOUNTER — Encounter: Payer: Self-pay | Admitting: Internal Medicine

## 2013-09-25 ENCOUNTER — Ambulatory Visit (HOSPITAL_BASED_OUTPATIENT_CLINIC_OR_DEPARTMENT_OTHER): Payer: Medicare Other

## 2013-09-25 DIAGNOSIS — C3410 Malignant neoplasm of upper lobe, unspecified bronchus or lung: Secondary | ICD-10-CM

## 2013-09-25 DIAGNOSIS — Z5111 Encounter for antineoplastic chemotherapy: Secondary | ICD-10-CM

## 2013-09-25 DIAGNOSIS — C3492 Malignant neoplasm of unspecified part of left bronchus or lung: Secondary | ICD-10-CM

## 2013-09-25 LAB — CBC WITH DIFFERENTIAL/PLATELET
BASO%: 1.5 % (ref 0.0–2.0)
Eosinophils Absolute: 0.2 10*3/uL (ref 0.0–0.5)
HCT: 34.1 % — ABNORMAL LOW (ref 34.8–46.6)
LYMPH%: 24.4 % (ref 14.0–49.7)
MCHC: 31.1 g/dL — ABNORMAL LOW (ref 31.5–36.0)
MONO#: 0.8 10*3/uL (ref 0.1–0.9)
MONO%: 12.2 % (ref 0.0–14.0)
NEUT#: 3.8 10*3/uL (ref 1.5–6.5)
NEUT%: 59.4 % (ref 38.4–76.8)
Platelets: 215 10*3/uL (ref 145–400)
RBC: 3.74 10*6/uL (ref 3.70–5.45)
WBC: 6.5 10*3/uL (ref 3.9–10.3)
lymph#: 1.6 10*3/uL (ref 0.9–3.3)
nRBC: 0 % (ref 0–0)

## 2013-09-25 LAB — COMPREHENSIVE METABOLIC PANEL (CC13)
ALT: 17 U/L (ref 0–55)
AST: 17 U/L (ref 5–34)
Albumin: 3.6 g/dL (ref 3.5–5.0)
Anion Gap: 13 mEq/L — ABNORMAL HIGH (ref 3–11)
CO2: 23 mEq/L (ref 22–29)
Calcium: 9.8 mg/dL (ref 8.4–10.4)
Chloride: 104 mEq/L (ref 98–109)
Potassium: 3.9 mEq/L (ref 3.5–5.1)
Total Bilirubin: 0.74 mg/dL (ref 0.20–1.20)

## 2013-09-25 MED ORDER — DEXAMETHASONE SODIUM PHOSPHATE 20 MG/5ML IJ SOLN
20.0000 mg | Freq: Once | INTRAMUSCULAR | Status: AC
Start: 2013-09-25 — End: 2013-09-25
  Administered 2013-09-25: 20 mg via INTRAVENOUS

## 2013-09-25 MED ORDER — DEXAMETHASONE SODIUM PHOSPHATE 20 MG/5ML IJ SOLN
INTRAMUSCULAR | Status: AC
  Filled 2013-09-25: qty 5

## 2013-09-25 MED ORDER — FAMOTIDINE IN NACL 20-0.9 MG/50ML-% IV SOLN
20.0000 mg | Freq: Once | INTRAVENOUS | Status: AC
  Administered 2013-09-25: 20 mg via INTRAVENOUS

## 2013-09-25 MED ORDER — SODIUM CHLORIDE 0.9 % IJ SOLN
10.0000 mL | INTRAMUSCULAR | Status: DC | PRN
  Filled 2013-09-25: qty 10

## 2013-09-25 MED ORDER — DEXTROSE 5 % IV SOLN: 175.0000 mg/m2 | Freq: Once | INTRAVENOUS | Status: DC

## 2013-09-25 MED ORDER — DIPHENHYDRAMINE HCL 50 MG/ML IJ SOLN
50.0000 mg | Freq: Once | INTRAMUSCULAR | Status: AC
  Administered 2013-09-25: 50 mg via INTRAVENOUS

## 2013-09-25 MED ORDER — DIPHENHYDRAMINE HCL 50 MG/ML IJ SOLN
INTRAMUSCULAR | Status: AC
  Filled 2013-09-25: qty 1

## 2013-09-25 MED ORDER — HEPARIN SOD (PORK) LOCK FLUSH 100 UNIT/ML IV SOLN
500.0000 [IU] | Freq: Once | INTRAVENOUS | Status: DC | PRN
  Filled 2013-09-25: qty 5

## 2013-09-25 MED ORDER — SODIUM CHLORIDE 0.9 % IV SOLN
175.0000 mg/m2 | Freq: Once | INTRAVENOUS | Status: AC
  Administered 2013-09-25: 258 mg via INTRAVENOUS
  Filled 2013-09-25: qty 43

## 2013-09-25 MED ORDER — SODIUM CHLORIDE 0.9 % IV SOLN
Freq: Once | INTRAVENOUS | Status: AC
  Administered 2013-09-25: 11:00:00 via INTRAVENOUS

## 2013-09-25 MED ORDER — ONDANSETRON 16 MG/50ML IVPB (CHCC)
INTRAVENOUS | Status: AC
  Filled 2013-09-25: qty 16

## 2013-09-25 MED ORDER — SODIUM CHLORIDE 0.9 % IV SOLN
321.5000 mg | Freq: Once | INTRAVENOUS | Status: AC
  Administered 2013-09-25: 320 mg via INTRAVENOUS
  Filled 2013-09-25: qty 32

## 2013-09-25 MED ORDER — FAMOTIDINE IN NACL 20-0.9 MG/50ML-% IV SOLN
INTRAVENOUS | Status: AC
  Filled 2013-09-25: qty 50

## 2013-09-25 MED ORDER — ONDANSETRON 16 MG/50ML IVPB (CHCC)
16.0000 mg | Freq: Once | INTRAVENOUS | Status: AC
  Administered 2013-09-25: 16 mg via INTRAVENOUS

## 2013-09-25 MED ORDER — OXYCODONE-ACETAMINOPHEN 5-325 MG PO TABS: 1.0000 | ORAL_TABLET | Freq: Four times a day (QID) | ORAL | Status: DC | PRN

## 2013-09-25 NOTE — Progress Notes (Signed)
[facility] [phone]   [phone]  SHARED VISIT PROGRESS NOTE  MCNEILL,[name], MD [address] [place] [address]  DIAGNOSIS: Unresectable Stage IIB (T3, N0, M0) non-small cell lung cancer, poorly differentiated squamous cell carcinoma diagnosed in June of 2014.   PRIOR THERAPY: Concurrent chemoradiation with weekly carboplatin for AUC of 2 and paclitaxel 45 mg/M2. Status post 5 cycles. First cycle was given on 04/09/2013. Last dose was given on 05/21/2013 with partial response.   CURRENT THERAPY: Consolidation chemotherapy with carboplatin for AUC of 4 and paclitaxel 150 mg/M2 every 3 weeks with Neulasta support. First dose given on 07/31/2013. Status post 2 cycles  CHEMOTHERAPY INTENT: Control/curative  CURRENT # OF CHEMOTHERAPY CYCLES: 3 CURRENT ANTIEMETICS: Zofran, dexamethasone and Compazine  CURRENT SMOKING STATUS: Former smoker  ORAL CHEMOTHERAPY AND CONSENT: None  CURRENT BISPHOSPHONATES USE: None  PAIN MANAGEMENT: 0/10  NARCOTICS INDUCED CONSTIPATION: None  LIVING WILL AND CODE STATUS: Full code   INTERVAL HISTORY: Chelsea Cruz 72 y.o. female returns to the clinic today for followup visit accompanied by a friend. She tolerated her second cycle of consolidation chemotherapy with carboplatin and paclitaxel fairly well except for joint and bone pain affecting her ankles and knees and to a lesser extent her hips after her Neulasta injection. The pain lasted for 3-4 days. She tried Tylenol and Claritin with minimal improvement. She denied having any significant chest pain, shortness of breath, cough or hemoptysis. The patient denied having any nausea or vomiting. She denied having any fever or chills.  MEDICAL HISTORY: Past Medical History  Diagnosis Date  . Nodule of left lung   . Hypertension   . Nicotine addiction   . Osteopenia   . Osteomalacia   . Impaired fasting glucose   . Hypercholesteremia   . Depression   . Lung  cancer     ALLERGIES:  is allergic to penicillins.  MEDICATIONS:  Current Outpatient Prescriptions  Medication Sig Dispense Refill  . alendronate (FOSAMAX) 70 MG tablet Take 70 mg by mouth every 7 (seven) days.       . cholecalciferol (VITAMIN D) 1000 UNITS tablet Take 3,000 Units by mouth daily. Takes 3 capsules      . ferrous sulfate 325 (65 FE) MG tablet Take 325 mg by mouth daily with breakfast.      . mirtazapine (REMERON) 15 MG tablet Take 1 tablet (15 mg total) by mouth at bedtime.  30 tablet  0  . amLODipine (NORVASC) 2.5 MG tablet 2.5 mg daily.       Marland Kitchen lisinopril (PRINIVIL,ZESTRIL) 10 MG tablet Take 10 mg by mouth daily.       . prochlorperazine (COMPAZINE) 10 MG tablet Take 1 tablet (10 mg total) by mouth every 6 (six) hours as needed (for nausea and vomiting).  30 tablet  1   No current facility-administered medications for this visit.    SURGICAL HISTORY:  Past Surgical History  Procedure Laterality Date  . Cholecystectomy    . Right oophorectomy      1969    REVIEW OF SYSTEMS:  Constitutional: positive for fatigue Eyes: negative Ears, nose, mouth, throat, and face: negative Respiratory: negative Cardiovascular: negative Gastrointestinal: negative Genitourinary:negative Integument/breast: negative Hematologic/lymphatic: negative Musculoskeletal:negative Neurological: negative Behavioral/Psych: positive for anxiety and depression Endocrine: negative Allergic/Immunologic: negative   PHYSICAL EXAMINATION: General appearance: alert, cooperative and no distress Head: Normocephalic, without obvious abnormality, atraumatic Neck: no adenopathy, no JVD, supple, symmetrical, trachea midline and thyroid not  enlarged, symmetric, no tenderness/mass/nodules Lymph nodes: Cervical, supraclavicular, and axillary nodes normal. Resp: clear to auscultation bilaterally Back: symmetric, no curvature. ROM normal. No CVA tenderness. Cardio: regular rate and rhythm, S1, S2 normal,  no murmur, click, rub or gallop GI: soft, non-tender; bowel sounds normal; no masses,  no organomegaly Extremities: extremities normal, atraumatic, no cyanosis or edema Neurologic: Alert and oriented X 3, normal strength and tone. Normal symmetric reflexes. Normal coordination and gait  ECOG PERFORMANCE STATUS: 1 - Symptomatic but completely ambulatory  Blood pressure 133/67, pulse 88, temperature 97.6 F (36.4 C), temperature source Oral, resp. rate 18, height 5\' 1"  (1.549 m), weight 120 lb 11.2 oz (54.749 kg), SpO2 98.00%.  LABORATORY DATA: Lab Results  Component Value Date   WBC 6.5 09/25/2013   HGB 10.6* 09/25/2013   HCT 34.1* 09/25/2013   MCV 91.2 09/25/2013   PLT 215 09/25/2013      Chemistry      Component Value Date/Time   NA 139 09/18/2013 1348   K 4.3 09/18/2013 1348   CL 98 04/09/2013 1114   CO2 21* 09/18/2013 1348   BUN 17.0 09/18/2013 1348   CREATININE 0.8 09/18/2013 1348   CREATININE 0.79 03/16/2013 1500      Component Value Date/Time   CALCIUM 9.6 09/18/2013 1348   ALKPHOS 104 09/18/2013 1348   AST 21 09/18/2013 1348   ALT 24 09/18/2013 1348   BILITOT 0.55 09/18/2013 1348       RADIOGRAPHIC STUDIES: No results found.  ASSESSMENT AND PLAN:  She is a very pleasant 72 years old white female with unresectable stage IIB non-small cell lung cancer who is currently undergoing consolidation chemotherapy with reduced dose carboplatin and paclitaxel, status post 2 cycles. She tolerated the second cycle of her treatment fairly well except for aching pain after Neulasta injection. I recommended for the patient to proceed with the third cycle of her treatment as scheduled today. She would come back for followup visit in 3 weeks with repeat CT scan of the chest. For depression the patient will continue on Remeron. She was advised to call immediately if she has any concerning symptoms in the interval. For pain management after the Neulasta injection, she was given RX for Percocet  5/325 mg PO q 6 hours as needed for pain. She was advised to call immediately if she has any concerning symptoms in the interval.  The patient voices understanding of current disease status and treatment options and is in agreement with the current care plan.  All questions were answered. The patient knows to call the clinic with any problems, questions or concerns. We can certainly see the patient much sooner if necessary.  Lajuana Matte., MD 09/25/2013

## 2013-09-25 NOTE — Patient Instructions (Signed)
CURRENT THERAPY: Consolidation chemotherapy with carboplatin for AUC of 4 and paclitaxel 150 mg/M2 every 3 weeks with Neulasta support. First dose given on 07/31/2013. Status post 2 cycles  CHEMOTHERAPY INTENT: Control/curative  CURRENT # OF CHEMOTHERAPY CYCLES: 3  CURRENT ANTIEMETICS: Zofran, dexamethasone and Compazine  CURRENT SMOKING STATUS: Former smoker  ORAL CHEMOTHERAPY AND CONSENT: None  CURRENT BISPHOSPHONATES USE: None  PAIN MANAGEMENT: 0/10  NARCOTICS INDUCED CONSTIPATION: None  LIVING WILL AND CODE STATUS: Full code

## 2013-09-25 NOTE — Patient Instructions (Signed)
[facility] Discharge Instructions for Patients Receiving Chemotherapy  Today you received the following chemotherapy agents: taxol, carboplatin  To help prevent nausea and vomiting after your treatment, we encourage you to take your nausea medication.  Take it as often as prescribed.     If you develop nausea and vomiting that is not controlled by your nausea medication, call the clinic. If it is after clinic hours your family physician or the after hours number for the clinic or go to the Emergency Department.   BELOW ARE SYMPTOMS THAT SHOULD BE REPORTED IMMEDIATELY:  *FEVER GREATER THAN 100.5 F  *CHILLS WITH OR WITHOUT FEVER  NAUSEA AND VOMITING THAT IS NOT CONTROLLED WITH YOUR NAUSEA MEDICATION  *UNUSUAL SHORTNESS OF BREATH  *UNUSUAL BRUISING OR BLEEDING  TENDERNESS IN MOUTH AND THROAT WITH OR WITHOUT PRESENCE OF ULCERS  *URINARY PROBLEMS  *BOWEL PROBLEMS  UNUSUAL RASH Items with * indicate a potential emergency and should be followed up as soon as possible.  Feel free to call the clinic you have any questions or concerns. The clinic phone number is [phone].   I have been informed and understand all the instructions given to me. I know to contact the clinic, my physician, or go to the Emergency Department if any problems should occur. I do not have any questions at this time, but understand that I may call the clinic during office hours   should I have any questions or need assistance in obtaining follow up care.    __________________________________________  _____________  __________ Signature of Patient or Authorized Representative            Date                   Time    __________________________________________ Nurse's Signature    

## 2013-09-26 ENCOUNTER — Telehealth: Payer: Self-pay | Admitting: Internal Medicine

## 2013-09-26 ENCOUNTER — Ambulatory Visit (HOSPITAL_BASED_OUTPATIENT_CLINIC_OR_DEPARTMENT_OTHER): Payer: Medicare Other

## 2013-09-26 DIAGNOSIS — C3410 Malignant neoplasm of upper lobe, unspecified bronchus or lung: Secondary | ICD-10-CM

## 2013-09-26 MED ORDER — PEGFILGRASTIM INJECTION 6 MG/0.6ML
6.0000 mg | Freq: Once | SUBCUTANEOUS | Status: AC
  Administered 2013-09-26: 6 mg via SUBCUTANEOUS
  Filled 2013-09-26: qty 0.6

## 2013-09-26 NOTE — Telephone Encounter (Signed)
lvm for pt with all DEC appt...mailed pt appt sched///avs and letter

## 2013-10-02 ENCOUNTER — Other Ambulatory Visit (HOSPITAL_BASED_OUTPATIENT_CLINIC_OR_DEPARTMENT_OTHER): Payer: Medicare Other

## 2013-10-02 DIAGNOSIS — C3410 Malignant neoplasm of upper lobe, unspecified bronchus or lung: Secondary | ICD-10-CM

## 2013-10-02 LAB — CBC WITH DIFFERENTIAL/PLATELET
BASO%: 0.9 % (ref 0.0–2.0)
Basophils Absolute: 0 10*3/uL (ref 0.0–0.1)
EOS%: 5.1 % (ref 0.0–7.0)
HCT: 32.6 % — ABNORMAL LOW (ref 34.8–46.6)
HGB: 10.7 g/dL — ABNORMAL LOW (ref 11.6–15.9)
LYMPH%: 30.9 % (ref 14.0–49.7)
MCH: 29.4 pg (ref 25.1–34.0)
MCHC: 32.8 g/dL (ref 31.5–36.0)
MCV: 89.5 fL (ref 79.5–101.0)
MONO%: 9.3 % (ref 0.0–14.0)
NEUT%: 53.8 % (ref 38.4–76.8)
RBC: 3.64 10*6/uL — ABNORMAL LOW (ref 3.70–5.45)
RDW: 18.3 % — ABNORMAL HIGH (ref 11.2–14.5)
lymph#: 1.6 10*3/uL (ref 0.9–3.3)

## 2013-10-02 LAB — COMPREHENSIVE METABOLIC PANEL (CC13)
ALT: 38 U/L (ref 0–55)
AST: 28 U/L (ref 5–34)
Calcium: 9.7 mg/dL (ref 8.4–10.4)
Chloride: 106 mEq/L (ref 98–109)
Creatinine: 0.8 mg/dL (ref 0.6–1.1)
Potassium: 4 mEq/L (ref 3.5–5.1)

## 2013-10-09 ENCOUNTER — Other Ambulatory Visit (HOSPITAL_BASED_OUTPATIENT_CLINIC_OR_DEPARTMENT_OTHER): Payer: Medicare Other

## 2013-10-09 ENCOUNTER — Other Ambulatory Visit: Payer: Self-pay | Admitting: Medical Oncology

## 2013-10-09 DIAGNOSIS — C3410 Malignant neoplasm of upper lobe, unspecified bronchus or lung: Secondary | ICD-10-CM

## 2013-10-09 LAB — CBC WITH DIFFERENTIAL/PLATELET
EOS%: 5.9 % (ref 0.0–7.0)
HGB: 10.6 g/dL — ABNORMAL LOW (ref 11.6–15.9)
MCH: 29.2 pg (ref 25.1–34.0)
MCHC: 32.2 g/dL (ref 31.5–36.0)
MCV: 90.5 fL (ref 79.5–101.0)
MONO%: 8.5 % (ref 0.0–14.0)
RBC: 3.65 10*6/uL — ABNORMAL LOW (ref 3.70–5.45)
RDW: 18.8 % — ABNORMAL HIGH (ref 11.2–14.5)
WBC: 7.5 10*3/uL (ref 3.9–10.3)
lymph#: 1.6 10*3/uL (ref 0.9–3.3)

## 2013-10-09 LAB — COMPREHENSIVE METABOLIC PANEL (CC13)
AST: 19 U/L (ref 5–34)
Albumin: 3.5 g/dL (ref 3.5–5.0)
Alkaline Phosphatase: 84 U/L (ref 40–150)
Anion Gap: 10 mEq/L (ref 3–11)
Potassium: 4.1 mEq/L (ref 3.5–5.1)
Sodium: 142 mEq/L (ref 136–145)
Total Bilirubin: 0.59 mg/dL (ref 0.20–1.20)
Total Protein: 6.9 g/dL (ref 6.4–8.3)

## 2013-10-15 ENCOUNTER — Other Ambulatory Visit: Payer: Self-pay | Admitting: *Deleted

## 2013-10-16 ENCOUNTER — Encounter (HOSPITAL_COMMUNITY): Payer: Self-pay

## 2013-10-16 ENCOUNTER — Ambulatory Visit (HOSPITAL_COMMUNITY)
Admission: RE | Admit: 2013-10-16 | Discharge: 2013-10-16 | Disposition: A | Discharge status: Home / Self Care | Payer: Medicare Other | Source: Ambulatory Visit | Attending: Internal Medicine | Admitting: Internal Medicine

## 2013-10-16 ENCOUNTER — Other Ambulatory Visit (HOSPITAL_BASED_OUTPATIENT_CLINIC_OR_DEPARTMENT_OTHER): Payer: Medicare Other

## 2013-10-16 DIAGNOSIS — Z923 Personal history of irradiation: Secondary | ICD-10-CM | POA: Insufficient documentation

## 2013-10-16 DIAGNOSIS — J479 Bronchiectasis, uncomplicated: Secondary | ICD-10-CM | POA: Insufficient documentation

## 2013-10-16 DIAGNOSIS — C3410 Malignant neoplasm of upper lobe, unspecified bronchus or lung: Secondary | ICD-10-CM

## 2013-10-16 DIAGNOSIS — R918 Other nonspecific abnormal finding of lung field: Secondary | ICD-10-CM | POA: Insufficient documentation

## 2013-10-16 DIAGNOSIS — K838 Other specified diseases of biliary tract: Secondary | ICD-10-CM | POA: Insufficient documentation

## 2013-10-16 DIAGNOSIS — J90 Pleural effusion, not elsewhere classified: Secondary | ICD-10-CM | POA: Insufficient documentation

## 2013-10-16 DIAGNOSIS — Z9221 Personal history of antineoplastic chemotherapy: Secondary | ICD-10-CM | POA: Insufficient documentation

## 2013-10-16 LAB — CBC WITH DIFFERENTIAL/PLATELET
Basophils Absolute: 0.1 10*3/uL (ref 0.0–0.1)
Eosinophils Absolute: 0.1 10*3/uL (ref 0.0–0.5)
HCT: 35.9 % (ref 34.8–46.6)
LYMPH%: 18.6 % (ref 14.0–49.7)
MCHC: 32.6 g/dL (ref 31.5–36.0)
MCV: 90.9 fL (ref 79.5–101.0)
MONO%: 7.8 % (ref 0.0–14.0)
NEUT#: 5.3 10*3/uL (ref 1.5–6.5)
NEUT%: 70.7 % (ref 38.4–76.8)
Platelets: 277 10*3/uL (ref 145–400)
RBC: 3.95 10*6/uL (ref 3.70–5.45)
lymph#: 1.4 10*3/uL (ref 0.9–3.3)

## 2013-10-16 LAB — COMPREHENSIVE METABOLIC PANEL (CC13)
Alkaline Phosphatase: 78 U/L (ref 40–150)
Anion Gap: 13 mEq/L — ABNORMAL HIGH (ref 3–11)
BUN: 15.1 mg/dL (ref 7.0–26.0)
Creatinine: 0.8 mg/dL (ref 0.6–1.1)
Glucose: 116 mg/dl (ref 70–140)
Potassium: 4.9 mEq/L (ref 3.5–5.1)
Sodium: 144 mEq/L (ref 136–145)
Total Bilirubin: 1.09 mg/dL (ref 0.20–1.20)
Total Protein: 7.6 g/dL (ref 6.4–8.3)

## 2013-10-16 MED ORDER — IOHEXOL 300 MG/ML  SOLN
80.0000 mL | Freq: Once | INTRAMUSCULAR | Status: AC | PRN
  Administered 2013-10-16: 80 mL via INTRAVENOUS

## 2013-10-17 ENCOUNTER — Encounter: Payer: Self-pay | Admitting: Internal Medicine

## 2013-10-17 ENCOUNTER — Ambulatory Visit (HOSPITAL_BASED_OUTPATIENT_CLINIC_OR_DEPARTMENT_OTHER): Payer: Medicare Other | Admitting: Internal Medicine

## 2013-10-17 ENCOUNTER — Telehealth: Payer: Self-pay | Admitting: Internal Medicine

## 2013-10-17 DIAGNOSIS — C3410 Malignant neoplasm of upper lobe, unspecified bronchus or lung: Secondary | ICD-10-CM

## 2013-10-17 NOTE — Progress Notes (Signed)
[facility] Telephone:([phone]   Fax:(336) [phone]  OFFICE VISIT PROGRESS NOTE  MCNEILL,[name], MD [address] [place] [address]  DIAGNOSIS: Unresectable Stage IIB (T3, N0, M0) non-small cell lung cancer, poorly differentiated squamous cell carcinoma diagnosed in June of 2014.   PRIOR THERAPY:  1) Concurrent chemoradiation with weekly carboplatin for AUC of 2 and paclitaxel 45 mg/M2. Status post 5 cycles. First cycle was given on 04/09/2013. Last dose was given on 05/21/2013 with partial response.  2) Consolidation chemotherapy with carboplatin for AUC of 4 and paclitaxel 150 mg/M2 every 3 weeks with Neulasta support. First dose given on 07/31/2013. Status post 3 cycles with stable disease.   CURRENT THERAPY: observation.  CHEMOTHERAPY INTENT: Control/curative  CURRENT # OF CHEMOTHERAPY CYCLES: 3 CURRENT ANTIEMETICS: Zofran, dexamethasone and Compazine  CURRENT SMOKING STATUS: Former smoker  ORAL CHEMOTHERAPY AND CONSENT: None  CURRENT BISPHOSPHONATES USE: None  PAIN MANAGEMENT: 0/10  NARCOTICS INDUCED CONSTIPATION: None  LIVING WILL AND CODE STATUS: Full code   INTERVAL HISTORY: Chelsea Cruz 72 y.o. female returns to the clinic today for followup visit accompanied by her daughter. She tolerated her third cycle of consolidation chemotherapy with carboplatin and paclitaxel fairly well.  She denied having any significant chest pain, but has only shortness of breath with exertion, cough or hemoptysis. The patient denied having any nausea or vomiting. She denied having any fever or chills. She had repeat CT scan of the chest performed recently and she is here for evaluation and discussion of her scan results.  MEDICAL HISTORY: Past Medical History  Diagnosis Date  . Nodule of left lung   . Hypertension   . Nicotine addiction   . Osteopenia   . Osteomalacia   . Impaired fasting glucose   . Hypercholesteremia   . Depression   . Lung cancer  dx'd 02/2013    ALLERGIES:  is allergic to penicillins.  MEDICATIONS:  Current Outpatient Prescriptions  Medication Sig Dispense Refill  . alendronate (FOSAMAX) 70 MG tablet Take 70 mg by mouth every 7 (seven) days.       . cholecalciferol (VITAMIN D) 1000 UNITS tablet Take 3,000 Units by mouth daily. Takes 3 capsules      . ferrous sulfate 325 (65 FE) MG tablet Take 325 mg by mouth daily with breakfast.      . mirtazapine (REMERON) 15 MG tablet Take 15 mg by mouth at bedtime. 1/2 tablet       No current facility-administered medications for this visit.    SURGICAL HISTORY:  Past Surgical History  Procedure Laterality Date  . Cholecystectomy    . Right oophorectomy      1969    REVIEW OF SYSTEMS:  Constitutional: positive for fatigue Eyes: negative Ears, nose, mouth, throat, and face: negative Respiratory: negative Cardiovascular: negative Gastrointestinal: negative Genitourinary:negative Integument/breast: negative Hematologic/lymphatic: negative Musculoskeletal:negative Neurological: negative Behavioral/Psych: positive for anxiety and depression Endocrine: negative Allergic/Immunologic: negative   PHYSICAL EXAMINATION: General appearance: alert, cooperative and no distress Head: Normocephalic, without obvious abnormality, atraumatic Neck: no adenopathy, no JVD, supple, symmetrical, trachea midline and thyroid not enlarged, symmetric, no tenderness/mass/nodules Lymph nodes: Cervical, supraclavicular, and axillary nodes normal. Resp: clear to auscultation bilaterally Back: symmetric, no curvature. ROM normal. No CVA tenderness. Cardio: regular rate and rhythm, S1, S2 normal, no murmur, click, rub or gallop GI: soft, non-tender; bowel sounds normal; no masses,  no organomegaly Extremities: extremities normal, atraumatic, no cyanosis or edema Neurologic: Alert and oriented X  3, normal strength and tone. Normal symmetric reflexes. Normal coordination and gait  ECOG  PERFORMANCE STATUS: 1 - Symptomatic but completely ambulatory  Blood pressure 143/65, pulse 94, temperature 98 F (36.7 C), temperature source Oral, resp. rate 18, height 5\' 1"  (1.549 m), weight 120 lb 12.8 oz (54.795 kg), SpO2 98.00%.  LABORATORY DATA: Lab Results  Component Value Date   WBC 7.5 10/16/2013   HGB 11.7 10/16/2013   HCT 35.9 10/16/2013   MCV 90.9 10/16/2013   PLT 277 10/16/2013      Chemistry      Component Value Date/Time   NA 144 10/16/2013 0953   K 4.9 10/16/2013 0953   CL 98 04/09/2013 1114   CO2 24 10/16/2013 0953   BUN 15.1 10/16/2013 0953   CREATININE 0.8 10/16/2013 0953   CREATININE 0.79 03/16/2013 1500      Component Value Date/Time   CALCIUM 10.2 10/16/2013 0953   ALKPHOS 78 10/16/2013 0953   AST 15 10/16/2013 0953   ALT 16 10/16/2013 0953   BILITOT 1.09 10/16/2013 0953       RADIOGRAPHIC STUDIES: Ct Chest W Contrast  10/16/2013   CLINICAL DATA:  History of lung neoplasia, status post chemotherapy completion 09/25/2013, XRT completion 05/2013, complaints of shortness of breath  EXAM: CT CHEST WITH CONTRAST  TECHNIQUE: Multidetector CT imaging of the chest was performed during intravenous contrast administration.  CONTRAST:  80mL OMNIPAQUE IOHEXOL 300 MG/ML  SOLN  COMPARISON:  06/19/2013  FINDINGS: The thoracic inlet is unremarkable.  There has been interval development of a small left pleural effusion. There are a loculated components as well as filling of the previously described cavitary lesion . There are diffuse areas of interstitial thickening, areas of mild bronchiectasis as well as an underlying ground-glass density within the lingula and superior segment right upper lobe. These findings likely reflect post radiation changes with areas of scarring, a component of post radiation inflammatory pneumonitis or possibly within an infectious pneumonitis cannot be excluded. More confluent consolidative components project within the posterior base of the  left lower lobe. These areas do contain air bronchograms. No focal discernible mass is appreciated. These findings in complex the bronchus of the lingula as well as the left lower lobe. A component of volume loss is also identified within the left hemi thorax. There has been interval reduction in the areas of pleural thickening along the anterior lateral left chest wall.  Evaluation of the right hemi thorax demonstrates no focal regions of consolidation, focal infiltrates, effusions, nor edema.  Areas of centrilobular emphysema are appreciated within the lung apices.  There is not appear to be appreciable mediastinal adenopathy.  Visualized upper abdominal viscera demonstrate stable mild intrahepatic biliary ductal dilatation an otherwise grossly unremarkable.  IMPRESSION: Severe interstitial changes within the lingula and superior segment left lower lobe. Differential considerations are post radiation changes with regions of fibrosis. A component of inflammatory pneumonitis and/or infectious pneumonitis within these areas is also a diagnostic consideration. There more consolidative components within the left lower lobe with differential considerations of consolidative infiltrate versus parenchymal edema. Neoplastic infiltration cannot be excluded. There has also been interval development of a small left pleural effusion. Percutaneous guided fluid sampling may help to characterize the etiology of the lung findings. Stable emphysematous changes are appreciated within the lungs as well as stable intrahepatic biliary dilation, mild.   Electronically Signed   By: Salome Holmes M.D.   On: 10/16/2013 11:43   ASSESSMENT AND PLAN:  She is a  very pleasant 72 years old white female with unresectable stage IIB non-small cell lung cancer. She status post concurrent chemoradiation and recently completed 3 cycles of consolidation chemotherapy with carboplatin and paclitaxel. The patient tolerated the last cycle of her  treatment fairly well with no significant adverse effects except for shortness breath with exertion. The recent CT scan of the chest showed no evidence for disease progression but there is evidence for persistent consolidation in the left lower lobe suspicious for radiation pneumonitis. I discussed the scan results and showed the images to the patient today. I recommended for her to continue on observation for now with repeat CT scan of the chest in 3 months. For depression the patient will continue on Remeron. She was advised to call immediately if she has any concerning symptoms in the interval.  The patient voices understanding of current disease status and treatment options and is in agreement with the current care plan.  All questions were answered. The patient knows to call the clinic with any problems, questions or concerns. We can certainly see the patient much sooner if necessary.  I spent 15 minutes counseling the patient face to face. The total time spent in the appointment was 25 minutes.   Lajuana Matte., MD 10/17/2013

## 2013-10-17 NOTE — Telephone Encounter (Signed)
gv and printed appt sched and avs for pt for April 2015  °

## 2013-10-18 NOTE — Patient Instructions (Signed)
CURRENT THERAPY: observation.  CHEMOTHERAPY INTENT: Control/curative  CURRENT # OF CHEMOTHERAPY CYCLES: 3 CURRENT ANTIEMETICS: Zofran, dexamethasone and Compazine  CURRENT SMOKING STATUS: Former smoker  ORAL CHEMOTHERAPY AND CONSENT: None  CURRENT BISPHOSPHONATES USE: None  PAIN MANAGEMENT: 0/10  NARCOTICS INDUCED CONSTIPATION: None  LIVING WILL AND CODE STATUS: Full code

## 2013-12-12 NOTE — Addendum Note (Signed)
Encounter addended by: Deirdre Evener, RN on: 12/12/2013 10:11 AM<BR>     Documentation filed: Charges VN

## 2014-01-16 ENCOUNTER — Ambulatory Visit (HOSPITAL_COMMUNITY)
Admission: RE | Admit: 2014-01-16 | Discharge: 2014-01-16 | Disposition: A | Discharge status: Home / Self Care | Payer: Medicare Other | Source: Ambulatory Visit | Attending: Internal Medicine | Admitting: Internal Medicine

## 2014-01-16 ENCOUNTER — Other Ambulatory Visit (HOSPITAL_BASED_OUTPATIENT_CLINIC_OR_DEPARTMENT_OTHER): Payer: Medicare Other

## 2014-01-16 DIAGNOSIS — J90 Pleural effusion, not elsewhere classified: Secondary | ICD-10-CM | POA: Insufficient documentation

## 2014-01-16 DIAGNOSIS — Z9221 Personal history of antineoplastic chemotherapy: Secondary | ICD-10-CM | POA: Insufficient documentation

## 2014-01-16 DIAGNOSIS — C3410 Malignant neoplasm of upper lobe, unspecified bronchus or lung: Secondary | ICD-10-CM | POA: Insufficient documentation

## 2014-01-16 LAB — CBC WITH DIFFERENTIAL/PLATELET
BASO%: 0.6 % (ref 0.0–2.0)
BASOS ABS: 0 10*3/uL (ref 0.0–0.1)
EOS%: 3.1 % (ref 0.0–7.0)
Eosinophils Absolute: 0.3 10*3/uL (ref 0.0–0.5)
HCT: 37.8 % (ref 34.8–46.6)
HEMOGLOBIN: 12.2 g/dL (ref 11.6–15.9)
LYMPH#: 2.1 10*3/uL (ref 0.9–3.3)
LYMPH%: 24.6 % (ref 14.0–49.7)
MCH: 29.4 pg (ref 25.1–34.0)
MCHC: 32.3 g/dL (ref 31.5–36.0)
MCV: 91.1 fL (ref 79.5–101.0)
MONO#: 0.7 10*3/uL (ref 0.1–0.9)
MONO%: 8.3 % (ref 0.0–14.0)
NEUT#: 5.3 10*3/uL (ref 1.5–6.5)
NEUT%: 63.4 % (ref 38.4–76.8)
Platelets: 278 10*3/uL (ref 145–400)
RBC: 4.15 10*6/uL (ref 3.70–5.45)
RDW: 13.5 % (ref 11.2–14.5)
WBC: 8.4 10*3/uL (ref 3.9–10.3)

## 2014-01-16 LAB — COMPREHENSIVE METABOLIC PANEL (CC13)
ALBUMIN: 3.8 g/dL (ref 3.5–5.0)
ALK PHOS: 68 U/L (ref 40–150)
ALT: 18 U/L (ref 0–55)
AST: 20 U/L (ref 5–34)
Anion Gap: 10 mEq/L (ref 3–11)
BUN: 19 mg/dL (ref 7.0–26.0)
CALCIUM: 9.9 mg/dL (ref 8.4–10.4)
CHLORIDE: 106 meq/L (ref 98–109)
CO2: 25 mEq/L (ref 22–29)
Creatinine: 0.8 mg/dL (ref 0.6–1.1)
Glucose: 101 mg/dl (ref 70–140)
POTASSIUM: 4.3 meq/L (ref 3.5–5.1)
SODIUM: 141 meq/L (ref 136–145)
Total Bilirubin: 0.68 mg/dL (ref 0.20–1.20)
Total Protein: 7.3 g/dL (ref 6.4–8.3)

## 2014-01-16 MED ORDER — IOHEXOL 300 MG/ML  SOLN
80.0000 mL | Freq: Once | INTRAMUSCULAR | Status: AC | PRN
  Administered 2014-01-16: 80 mL via INTRAVENOUS

## 2014-01-23 ENCOUNTER — Telehealth: Payer: Self-pay | Admitting: Internal Medicine

## 2014-01-23 ENCOUNTER — Encounter: Payer: Self-pay | Admitting: Internal Medicine

## 2014-01-23 ENCOUNTER — Ambulatory Visit (HOSPITAL_BASED_OUTPATIENT_CLINIC_OR_DEPARTMENT_OTHER): Payer: Medicare Other | Admitting: Internal Medicine

## 2014-01-23 VITALS — BP 144/50 | HR 103 | Temp 98.4°F | Resp 18 | Ht 61.0 in | Wt 127.9 lb

## 2014-01-23 DIAGNOSIS — C3410 Malignant neoplasm of upper lobe, unspecified bronchus or lung: Secondary | ICD-10-CM

## 2014-01-23 NOTE — Telephone Encounter (Signed)
gv adn printed appt sched and avs for pt for July  °

## 2014-01-23 NOTE — Progress Notes (Signed)
[facility] Telephone:(336) [phone]   Fax:(336) [place], [place] [address]  DIAGNOSIS: Unresectable Stage IIB (T3, N0, M0) non-small cell lung cancer, poorly differentiated squamous cell carcinoma diagnosed in June of 2014.   PRIOR THERAPY:  1) Concurrent chemoradiation with weekly carboplatin for AUC of 2 and paclitaxel 45 mg/M2. Status post 5 cycles. First cycle was given on 04/09/2013. Last dose was given on 05/21/2013 with partial response.  2) Consolidation chemotherapy with carboplatin for AUC of 4 and paclitaxel 150 mg/M2 every 3 weeks with Neulasta support. First dose given on 07/31/2013. Status post 3 cycles with stable disease.   CURRENT THERAPY: Observation.  CHEMOTHERAPY INTENT: Control/curative  CURRENT # OF CHEMOTHERAPY CYCLES: 0 CURRENT ANTIEMETICS: Zofran, dexamethasone and Compazine  CURRENT SMOKING STATUS: Former smoker  ORAL CHEMOTHERAPY AND CONSENT: None  CURRENT BISPHOSPHONATES USE: None  PAIN MANAGEMENT: 0/10  NARCOTICS INDUCED CONSTIPATION: None  LIVING WILL AND CODE STATUS: Full code   INTERVAL HISTORY: Chelsea Cruz 73 y.o. female returns to the clinic today for followup visit accompanied by her daughter.  The patient is doing very well today with no specific complaints except for occasional shortness breath with exertion.  She denied having any significant chest pain, cough or hemoptysis. The patient denied having any nausea or vomiting. She denied having any fever or chills. She had repeat CT scan of the chest performed recently and she is here for evaluation and discussion of her scan results.  MEDICAL HISTORY: Past Medical History  Diagnosis Date  . Nodule of left lung   . Hypertension   . Nicotine addiction   . Osteopenia   . Osteomalacia   . Impaired fasting glucose   . Hypercholesteremia   . Depression   . Lung cancer dx'd 02/2013    ALLERGIES:  is  allergic to penicillins.  MEDICATIONS:  Current Outpatient Prescriptions  Medication Sig Dispense Refill  . alendronate (FOSAMAX) 70 MG tablet Take 70 mg by mouth every 7 (seven) days.       . cholecalciferol (VITAMIN D) 1000 UNITS tablet Take 3,000 Units by mouth daily. Takes 3 capsules      . ferrous sulfate 325 (65 FE) MG tablet Take 325 mg by mouth daily with breakfast.       No current facility-administered medications for this visit.    SURGICAL HISTORY:  Past Surgical History  Procedure Laterality Date  . Cholecystectomy    . Right oophorectomy      1969    REVIEW OF SYSTEMS:  Constitutional: positive for fatigue Eyes: negative Ears, nose, mouth, throat, and face: negative Respiratory: negative Cardiovascular: negative Gastrointestinal: negative Genitourinary:negative Integument/breast: negative Hematologic/lymphatic: negative Musculoskeletal:negative Neurological: negative Behavioral/Psych: positive for anxiety and depression Endocrine: negative Allergic/Immunologic: negative   PHYSICAL EXAMINATION: General appearance: alert, cooperative and no distress Head: Normocephalic, without obvious abnormality, atraumatic Neck: no adenopathy, no JVD, supple, symmetrical, trachea midline and thyroid not enlarged, symmetric, no tenderness/mass/nodules Lymph nodes: Cervical, supraclavicular, and axillary nodes normal. Resp: clear to auscultation bilaterally Back: symmetric, no curvature. ROM normal. No CVA tenderness. Cardio: regular rate and rhythm, S1, S2 normal, no murmur, click, rub or gallop GI: soft, non-tender; bowel sounds normal; no masses,  no organomegaly Extremities: extremities normal, atraumatic, no cyanosis or edema Neurologic: Alert and oriented X 3, normal strength and tone. Normal symmetric reflexes. Normal coordination and gait  ECOG PERFORMANCE STATUS: 1 - Symptomatic but completely ambulatory  Blood pressure 144/50, pulse 103, temperature 98.4 F (36.9  C), temperature source Oral, resp. rate 18, height 5\' 1"  (1.549 m), weight 127 lb 14.4 oz (58.015 kg).  LABORATORY DATA: Lab Results  Component Value Date   WBC 8.4 01/16/2014   HGB 12.2 01/16/2014   HCT 37.8 01/16/2014   MCV 91.1 01/16/2014   PLT 278 01/16/2014      Chemistry      Component Value Date/Time   NA 141 01/16/2014 1600   K 4.3 01/16/2014 1600   CL 98 04/09/2013 1114   CO2 25 01/16/2014 1600   BUN 19.0 01/16/2014 1600   CREATININE 0.8 01/16/2014 1600   CREATININE 0.79 03/16/2013 1500      Component Value Date/Time   CALCIUM 9.9 01/16/2014 1600   ALKPHOS 68 01/16/2014 1600   AST 20 01/16/2014 1600   ALT 18 01/16/2014 1600   BILITOT 0.68 01/16/2014 1600       RADIOGRAPHIC STUDIES: Ct Chest W Contrast  01/16/2014   CLINICAL DATA:  Malignant lung neoplasm on the left. Chemotherapy completed 2014. Followup.  EXAM: CT CHEST WITH CONTRAST  TECHNIQUE: Multidetector CT imaging of the chest was performed during intravenous contrast administration.  CONTRAST:  45mL OMNIPAQUE IOHEXOL 300 MG/ML  SOLN  COMPARISON:  10/16/2013  FINDINGS: Cardiac silhouette is normal in size and configuration. Great vessels are normal in caliber. No neck base, axillary or mediastinal or right hilar masses or adenopathy.  There is soft tissue that extends are on the left hilar structures contiguous with confluent opacity in the left upper lobe that extends to the oblique fissure and left lateral pleural margin and inferiorly to the left lung base. Similar-appearing confluent opacity is seen in the posterior left lower lobe. There are intervening areas coarse reticular opacity. The upper lobe opacity has mildly retracted when compared to the prior study. Lower lobe changes are stable. No discrete mass or lung nodule is seen. Small left pleural effusion is without significant change. No right pleural effusion.  There are changes of emphysema. The right lung is hyperexpanded. There is volume loss on the left. No right lung mass or  nodule.  Limited evaluation of the upper abdomen shows mild chronic intra and extrahepatic bile duct dilation and stable left adrenal nodularity, likely hyperplasia.  No osteoblastic or osteolytic lesions.  IMPRESSION: 1. Left perihilar, upper lobe and lower lobe lying abnormality extending from soft tissue surrounding the left hilar structures. There has been my retraction the left upper lobe opacity. The changes are otherwise stable. There is a stable small left pleural effusion. 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 No convincing residual/recurrent lung carcinoma. 2. Clear right lung.  Stable changes of emphysema. 3. No evidence of metastatic disease.   Electronically Signed   By: Lajean Manes M.D.   On: 01/16/2014 17:18   ASSESSMENT AND PLAN:  She is a very pleasant 73 years old white female with unresectable stage IIB non-small cell lung cancer. She status post concurrent chemoradiation and recently completed 3 cycles of consolidation chemotherapy with carboplatin and paclitaxel. The patient has been observation for the last 3 months. Her CT scan of the chest showed stable disease. I discussed the scan results with the patient today. I recommended for her to continue on observation for now with repeat CT scan of the chest in 3 months. For depression the patient will continue on Remeron. She was advised to call immediately if she has any  concerning symptoms in the interval.  The patient voices understanding of current disease status and treatment options and is in agreement with the current care plan.  All questions were answered. The patient knows to call the clinic with any problems, questions or concerns. We can certainly see the patient much sooner if necessary.  Disclaimer: This note was dictated with voice recognition software. Similar sounding words can inadvertently be transcribed and may not be corrected upon  review.   Curt Bears, MD 01/23/2014

## 2014-03-14 ENCOUNTER — Telehealth: Payer: Self-pay | Admitting: Internal Medicine

## 2014-03-14 NOTE — Telephone Encounter (Signed)
no answer...mailed pt appt sched and letter of appt change due to MD on pal

## 2014-04-24 ENCOUNTER — Other Ambulatory Visit (HOSPITAL_BASED_OUTPATIENT_CLINIC_OR_DEPARTMENT_OTHER): Payer: Medicare Other

## 2014-04-24 ENCOUNTER — Ambulatory Visit (HOSPITAL_COMMUNITY)
Admission: RE | Admit: 2014-04-24 | Discharge: 2014-04-24 | Disposition: A | Discharge status: Home / Self Care | Payer: Medicare Other | Source: Ambulatory Visit | Attending: Internal Medicine | Admitting: Internal Medicine

## 2014-04-24 DIAGNOSIS — I2510 Atherosclerotic heart disease of native coronary artery without angina pectoris: Secondary | ICD-10-CM | POA: Insufficient documentation

## 2014-04-24 DIAGNOSIS — Z923 Personal history of irradiation: Secondary | ICD-10-CM | POA: Insufficient documentation

## 2014-04-24 DIAGNOSIS — C3410 Malignant neoplasm of upper lobe, unspecified bronchus or lung: Secondary | ICD-10-CM

## 2014-04-24 DIAGNOSIS — J438 Other emphysema: Secondary | ICD-10-CM | POA: Insufficient documentation

## 2014-04-24 DIAGNOSIS — R918 Other nonspecific abnormal finding of lung field: Secondary | ICD-10-CM | POA: Insufficient documentation

## 2014-04-24 DIAGNOSIS — Z9221 Personal history of antineoplastic chemotherapy: Secondary | ICD-10-CM | POA: Insufficient documentation

## 2014-04-24 DIAGNOSIS — C3490 Malignant neoplasm of unspecified part of unspecified bronchus or lung: Secondary | ICD-10-CM | POA: Insufficient documentation

## 2014-04-24 LAB — CBC WITH DIFFERENTIAL/PLATELET
BASO%: 0.8 % (ref 0.0–2.0)
BASOS ABS: 0.1 10*3/uL (ref 0.0–0.1)
EOS%: 3.4 % (ref 0.0–7.0)
Eosinophils Absolute: 0.3 10*3/uL (ref 0.0–0.5)
HCT: 38.2 % (ref 34.8–46.6)
HEMOGLOBIN: 12.4 g/dL (ref 11.6–15.9)
LYMPH%: 23.1 % (ref 14.0–49.7)
MCH: 29.2 pg (ref 25.1–34.0)
MCHC: 32.3 g/dL (ref 31.5–36.0)
MCV: 90.2 fL (ref 79.5–101.0)
MONO#: 0.7 10*3/uL (ref 0.1–0.9)
MONO%: 8.5 % (ref 0.0–14.0)
NEUT#: 5.2 10*3/uL (ref 1.5–6.5)
NEUT%: 64.2 % (ref 38.4–76.8)
Platelets: 286 10*3/uL (ref 145–400)
RBC: 4.24 10*6/uL (ref 3.70–5.45)
RDW: 13.9 % (ref 11.2–14.5)
WBC: 8.1 10*3/uL (ref 3.9–10.3)
lymph#: 1.9 10*3/uL (ref 0.9–3.3)

## 2014-04-24 LAB — COMPREHENSIVE METABOLIC PANEL (CC13)
ALK PHOS: 67 U/L (ref 40–150)
ALT: 12 U/L (ref 0–55)
AST: 14 U/L (ref 5–34)
Albumin: 3.7 g/dL (ref 3.5–5.0)
Anion Gap: 8 mEq/L (ref 3–11)
BILIRUBIN TOTAL: 0.44 mg/dL (ref 0.20–1.20)
BUN: 14.3 mg/dL (ref 7.0–26.0)
CO2: 28 mEq/L (ref 22–29)
Calcium: 10.3 mg/dL (ref 8.4–10.4)
Chloride: 104 mEq/L (ref 98–109)
Creatinine: 0.9 mg/dL (ref 0.6–1.1)
GLUCOSE: 89 mg/dL (ref 70–140)
Potassium: 4.3 mEq/L (ref 3.5–5.1)
Sodium: 141 mEq/L (ref 136–145)
Total Protein: 7.2 g/dL (ref 6.4–8.3)

## 2014-04-24 MED ORDER — IOHEXOL 300 MG/ML  SOLN
80.0000 mL | Freq: Once | INTRAMUSCULAR | Status: AC | PRN
  Administered 2014-04-24: 80 mL via INTRAVENOUS

## 2014-04-30 ENCOUNTER — Ambulatory Visit (HOSPITAL_BASED_OUTPATIENT_CLINIC_OR_DEPARTMENT_OTHER): Payer: Medicare Other | Admitting: Internal Medicine

## 2014-04-30 ENCOUNTER — Telehealth: Payer: Self-pay | Admitting: Internal Medicine

## 2014-04-30 ENCOUNTER — Encounter: Payer: Self-pay | Admitting: Internal Medicine

## 2014-04-30 VITALS — BP 129/68 | HR 88 | Temp 97.6°F | Resp 19 | Ht 61.0 in | Wt 133.3 lb

## 2014-04-30 DIAGNOSIS — R05 Cough: Secondary | ICD-10-CM

## 2014-04-30 DIAGNOSIS — C3410 Malignant neoplasm of upper lobe, unspecified bronchus or lung: Secondary | ICD-10-CM

## 2014-04-30 DIAGNOSIS — R059 Cough, unspecified: Secondary | ICD-10-CM

## 2014-04-30 MED ORDER — HYDROCODONE-HOMATROPINE 5-1.5 MG/5ML PO SYRP: 5.0000 mL | ORAL_SOLUTION | Freq: Four times a day (QID) | ORAL | Status: DC | PRN

## 2014-04-30 NOTE — Telephone Encounter (Signed)
Pt confirmed labs/ov per 07/14 POF, gave pt AVS...Marland KitchenMarland KitchenKJ

## 2014-04-30 NOTE — Progress Notes (Signed)
[name] Telephone:([phone]   Fax:(336) [place], [place] [address]  DIAGNOSIS: Unresectable Stage IIB (T3, N0, M0) non-small cell lung cancer, poorly differentiated squamous cell carcinoma diagnosed in June of 2014.   PRIOR THERAPY:  1) Concurrent chemoradiation with weekly carboplatin for AUC of 2 and paclitaxel 45 mg/M2. Status post 5 cycles. First cycle was given on 04/09/2013. Last dose was given on 05/21/2013 with partial response.  2) Consolidation chemotherapy with carboplatin for AUC of 4 and paclitaxel 150 mg/M2 every 3 weeks with Neulasta support. First dose given on 07/31/2013. Status post 3 cycles with stable disease.   CURRENT THERAPY: Observation.  CHEMOTHERAPY INTENT: Control/curative  CURRENT # OF CHEMOTHERAPY CYCLES: 0 CURRENT ANTIEMETICS: Zofran, dexamethasone and Compazine  CURRENT SMOKING STATUS: Former smoker  ORAL CHEMOTHERAPY AND CONSENT: None  CURRENT BISPHOSPHONATES USE: None  PAIN MANAGEMENT: 0/10  NARCOTICS INDUCED CONSTIPATION: None  LIVING WILL AND CODE STATUS: Full code   INTERVAL HISTORY: Chelsea Cruz 73 y.o. female returns to the clinic today for followup visit accompanied by her daughter.  The patient is doing very well today with no specific complaints except for occasional shortness breath with exertion and dry cough.  She denied having any significant chest pain, or hemoptysis. The patient denied having any nausea or vomiting. She denied having any fever or chills. She has no weight loss or night sweats. She had repeat CT scan of the chest performed recently and she is here for evaluation and discussion of her scan results.  MEDICAL HISTORY: Past Medical History  Diagnosis Date  . Nodule of left lung   . Hypertension   . Nicotine addiction   . Osteopenia   . Osteomalacia   . Impaired fasting glucose   . Hypercholesteremia   . Depression     . Lung cancer dx'd 02/2013    ALLERGIES:  is allergic to penicillins.  MEDICATIONS:  Current Outpatient Prescriptions  Medication Sig Dispense Refill  . alendronate (FOSAMAX) 70 MG tablet Take 70 mg by mouth every 7 (seven) days.       . cholecalciferol (VITAMIN D) 1000 UNITS tablet Take 3,000 Units by mouth daily. Takes 3 capsules      . ferrous sulfate 325 (65 FE) MG tablet Take 325 mg by mouth daily with breakfast.       No current facility-administered medications for this visit.    SURGICAL HISTORY:  Past Surgical History  Procedure Laterality Date  . Cholecystectomy    . Right oophorectomy      1969    REVIEW OF SYSTEMS:  A comprehensive review of systems was negative except for: Respiratory: positive for cough and dyspnea on exertion   PHYSICAL EXAMINATION: General appearance: alert, cooperative and no distress Head: Normocephalic, without obvious abnormality, atraumatic Neck: no adenopathy, no JVD, supple, symmetrical, trachea midline and thyroid not enlarged, symmetric, no tenderness/mass/nodules Lymph nodes: Cervical, supraclavicular, and axillary nodes normal. Resp: clear to auscultation bilaterally Back: symmetric, no curvature. ROM normal. No CVA tenderness. Cardio: regular rate and rhythm, S1, S2 normal, no murmur, click, rub or gallop GI: soft, non-tender; bowel sounds normal; no masses,  no organomegaly Extremities: extremities normal, atraumatic, no cyanosis or edema Neurologic: Alert and oriented X 3, normal strength and tone. Normal symmetric reflexes. Normal coordination and gait  ECOG PERFORMANCE STATUS: 1 - Symptomatic but completely ambulatory  Blood pressure 129/68, pulse 88, temperature 97.6 F (36.4  C), temperature source Oral, resp. rate 19, height 5\' 1"  (1.549 m), weight 133 lb 4.8 oz (60.464 kg).  LABORATORY DATA: Lab Results  Component Value Date   WBC 8.1 04/24/2014   HGB 12.4 04/24/2014   HCT 38.2 04/24/2014   MCV 90.2 04/24/2014   PLT 286  04/24/2014      Chemistry      Component Value Date/Time   NA 141 04/24/2014 1606   K 4.3 04/24/2014 1606   CL 98 04/09/2013 1114   CO2 28 04/24/2014 1606   BUN 14.3 04/24/2014 1606   CREATININE 0.9 04/24/2014 1606   CREATININE 0.79 03/16/2013 1500      Component Value Date/Time   CALCIUM 10.3 04/24/2014 1606   ALKPHOS 67 04/24/2014 1606   AST 14 04/24/2014 1606   ALT 12 04/24/2014 1606   BILITOT 0.44 04/24/2014 1606       RADIOGRAPHIC STUDIES: Ct Chest W Contrast  04/25/2014   CLINICAL DATA:  Lung cancer. Status post chemotherapy and radiation therapy.  EXAM: CT CHEST WITH CONTRAST  TECHNIQUE: Multidetector CT imaging of the chest was performed during intravenous contrast administration.  CONTRAST:  58mL OMNIPAQUE IOHEXOL 300 MG/ML  SOLN  COMPARISON:  01/16/2014.  FINDINGS: Examination of the chest wall demonstrates fairly significant soft tissue thickening involving the lower left chest wall. I think this is most likely due to radiation effect. There is also thickening of the cyst radius anterior muscle and mild surrounding edema. This has not significantly changed since the CT of 01/16/2014. I do not see a discrete mass or destructive bony changes. Continued surveillance is recommended.  No breast masses, supraclavicular or axillary adenopathy. The thyroid gland is normal. The bony thorax is intact.  The heart is normal in size. No pericardial effusion. No mediastinal or hilar mass or adenopathy. Small scattered lymph nodes are stable. The aorta is normal in caliber. Stable atherosclerotic calcifications. The esophagus is grossly normal.  Examination of the lung parenchyma demonstrates stable emphysematous changes. Fairly extensive radiation changes involving the left lower lung with some residual matted soft tissue density in the left hilar and infrahilar regions which is likely treated tumor. A chronic left pleural effusion is again demonstrated. No enhancing pleural nodules.  The right lung is clear.  No  worrisome pulmonary nodules.  The upper abdomen is unremarkable and stable. Stable intra and extrahepatic biliary dilatation likely due to prior cholecystectomy. Stable nodularity of both adrenal glands.  IMPRESSION: 1. Extensive radiation changes involving the left hemi thorax as discussed above. Residual matted soft tissue density in the left hilar and infrahilar regions is likely treated tumor. No CT findings worrisome for recurrent or progressive tumor and no pulmonary metastatic lesions. 2. Left-sided chest wall thickening and edema is likely radiation effect. 3. Stable emphysematous changes. 4. Stable intra and extrahepatic biliary dilatation and stable nodularity of both adrenal glands.   Electronically Signed   By: Kalman Jewels M.D.   On: 04/25/2014 09:35   ASSESSMENT AND PLAN:  She is a very pleasant 73 years old white female with unresectable stage IIB non-small cell lung cancer. She status post concurrent chemoradiation and recently completed 3 cycles of consolidation chemotherapy with carboplatin and paclitaxel. Her CT scan of the chest showed stable disease. I discussed the scan results with the patient today. I recommended for her to continue on observation for now with repeat CT scan of the chest in 3 months. For the dry cough I will start the patient on Hycodan 5 ML  by mouth every 6 hours as needed. She was advised to call immediately if she has any concerning symptoms in the interval.  The patient voices understanding of current disease status and treatment options and is in agreement with the current care plan.  All questions were answered. The patient knows to call the clinic with any problems, questions or concerns. We can certainly see the patient much sooner if necessary.  Disclaimer: This note was dictated with voice recognition software. Similar sounding words can inadvertently be transcribed and may not be corrected upon review.   Eilleen Kempf., MD 04/30/2014

## 2014-05-01 ENCOUNTER — Ambulatory Visit: Payer: Medicare Other | Admitting: Internal Medicine

## 2014-07-31 ENCOUNTER — Ambulatory Visit (HOSPITAL_COMMUNITY)
Admission: RE | Admit: 2014-07-31 | Discharge: 2014-07-31 | Disposition: A | Discharge status: Home / Self Care | Payer: Medicare Other | Source: Ambulatory Visit | Attending: Internal Medicine | Admitting: Internal Medicine

## 2014-07-31 ENCOUNTER — Other Ambulatory Visit (HOSPITAL_BASED_OUTPATIENT_CLINIC_OR_DEPARTMENT_OTHER): Payer: Medicare Other

## 2014-07-31 DIAGNOSIS — Z923 Personal history of irradiation: Secondary | ICD-10-CM | POA: Diagnosis not present

## 2014-07-31 DIAGNOSIS — C3430 Malignant neoplasm of lower lobe, unspecified bronchus or lung: Secondary | ICD-10-CM

## 2014-07-31 DIAGNOSIS — Z9221 Personal history of antineoplastic chemotherapy: Secondary | ICD-10-CM | POA: Insufficient documentation

## 2014-07-31 DIAGNOSIS — C3410 Malignant neoplasm of upper lobe, unspecified bronchus or lung: Secondary | ICD-10-CM

## 2014-07-31 LAB — COMPREHENSIVE METABOLIC PANEL (CC13)
ALT: 17 U/L (ref 0–55)
ANION GAP: 10 meq/L (ref 3–11)
AST: 17 U/L (ref 5–34)
Albumin: 3.5 g/dL (ref 3.5–5.0)
Alkaline Phosphatase: 74 U/L (ref 40–150)
BILIRUBIN TOTAL: 0.61 mg/dL (ref 0.20–1.20)
BUN: 8.3 mg/dL (ref 7.0–26.0)
CO2: 26 meq/L (ref 22–29)
CREATININE: 0.9 mg/dL (ref 0.6–1.1)
Calcium: 9.9 mg/dL (ref 8.4–10.4)
Chloride: 106 mEq/L (ref 98–109)
GLUCOSE: 136 mg/dL (ref 70–140)
Potassium: 3.9 mEq/L (ref 3.5–5.1)
Sodium: 141 mEq/L (ref 136–145)
Total Protein: 7.3 g/dL (ref 6.4–8.3)

## 2014-07-31 LAB — CBC WITH DIFFERENTIAL/PLATELET
BASO%: 0.8 % (ref 0.0–2.0)
BASOS ABS: 0.1 10*3/uL (ref 0.0–0.1)
EOS ABS: 0.4 10*3/uL (ref 0.0–0.5)
EOS%: 4.6 % (ref 0.0–7.0)
HCT: 37.9 % (ref 34.8–46.6)
HEMOGLOBIN: 12.1 g/dL (ref 11.6–15.9)
LYMPH%: 17.7 % (ref 14.0–49.7)
MCH: 28.7 pg (ref 25.1–34.0)
MCHC: 32 g/dL (ref 31.5–36.0)
MCV: 89.7 fL (ref 79.5–101.0)
MONO#: 0.5 10*3/uL (ref 0.1–0.9)
MONO%: 6.2 % (ref 0.0–14.0)
NEUT%: 70.7 % (ref 38.4–76.8)
NEUTROS ABS: 5.7 10*3/uL (ref 1.5–6.5)
PLATELETS: 368 10*3/uL (ref 145–400)
RBC: 4.22 10*6/uL (ref 3.70–5.45)
RDW: 15.4 % — ABNORMAL HIGH (ref 11.2–14.5)
WBC: 8.1 10*3/uL (ref 3.9–10.3)
lymph#: 1.4 10*3/uL (ref 0.9–3.3)

## 2014-07-31 MED ORDER — IOHEXOL 300 MG/ML  SOLN
80.0000 mL | Freq: Once | INTRAMUSCULAR | Status: AC | PRN
  Administered 2014-07-31: 80 mL via INTRAVENOUS

## 2014-08-01 ENCOUNTER — Telehealth: Payer: Self-pay | Admitting: Medical Oncology

## 2014-08-01 NOTE — Telephone Encounter (Signed)
Is it okay for her to have mammogram on Monday ? I told her yes.

## 2014-08-07 ENCOUNTER — Telehealth: Payer: Self-pay | Admitting: Internal Medicine

## 2014-08-07 ENCOUNTER — Encounter: Payer: Self-pay | Admitting: Internal Medicine

## 2014-08-07 ENCOUNTER — Ambulatory Visit (HOSPITAL_BASED_OUTPATIENT_CLINIC_OR_DEPARTMENT_OTHER): Payer: Medicare Other

## 2014-08-07 ENCOUNTER — Ambulatory Visit (HOSPITAL_BASED_OUTPATIENT_CLINIC_OR_DEPARTMENT_OTHER): Payer: Medicare Other | Admitting: Internal Medicine

## 2014-08-07 VITALS — BP 131/61 | HR 108 | Temp 98.4°F | Resp 17 | Ht 61.0 in | Wt 131.6 lb

## 2014-08-07 DIAGNOSIS — C3432 Malignant neoplasm of lower lobe, left bronchus or lung: Secondary | ICD-10-CM

## 2014-08-07 DIAGNOSIS — C3410 Malignant neoplasm of upper lobe, unspecified bronchus or lung: Secondary | ICD-10-CM

## 2014-08-07 DIAGNOSIS — Z23 Encounter for immunization: Secondary | ICD-10-CM

## 2014-08-07 MED ORDER — INFLUENZA VAC SPLIT QUAD 0.5 ML IM SUSY
0.5000 mL | PREFILLED_SYRINGE | Freq: Once | INTRAMUSCULAR | Status: AC
  Administered 2014-08-07: 0.5 mL via INTRAMUSCULAR
  Filled 2014-08-07: qty 0.5

## 2014-08-07 NOTE — Progress Notes (Signed)
[facility] Telephone:(336) [phone]   Fax:(336) [place], [place] [address]  DIAGNOSIS: Unresectable Stage IIB (T3, N0, M0) non-small cell lung cancer, poorly differentiated squamous cell carcinoma diagnosed in June of 2014.   PRIOR THERAPY:  1) Concurrent chemoradiation with weekly carboplatin for AUC of 2 and paclitaxel 45 mg/M2. Status post 5 cycles. First cycle was given on 04/09/2013. Last dose was given on 05/21/2013 with partial response.  2) Consolidation chemotherapy with carboplatin for AUC of 4 and paclitaxel 150 mg/M2 every 3 weeks with Neulasta support. First dose given on 07/31/2013. Status post 3 cycles with stable disease.   CURRENT THERAPY: Observation.  CHEMOTHERAPY INTENT: Control/curative  CURRENT # OF CHEMOTHERAPY CYCLES: 0 CURRENT ANTIEMETICS: Zofran, dexamethasone and Compazine  CURRENT SMOKING STATUS: Former smoker  ORAL CHEMOTHERAPY AND CONSENT: None  CURRENT BISPHOSPHONATES USE: None  PAIN MANAGEMENT: 0/10  NARCOTICS INDUCED CONSTIPATION: None  LIVING WILL AND CODE STATUS: Full code   INTERVAL HISTORY: Chelsea Cruz 73 y.o. female returns to the clinic today for followup visit accompanied by her daughter. The patient has been observation for close to a year and feeling very well. The patient denied having any specific complaints except for occasional shortness breath with exertion and dry cough.  She denied having any significant chest pain, or hemoptysis. The patient denied having any nausea or vomiting. She denied having any fever or chills. She has no weight loss or night sweats. She had repeat CT scan of the chest performed recently and she is here for evaluation and discussion of her scan results.  MEDICAL HISTORY: Past Medical History  Diagnosis Date  . Nodule of left lung   . Hypertension   . Nicotine addiction   . Osteopenia   . Osteomalacia   . Impaired  fasting glucose   . Hypercholesteremia   . Depression   . Lung cancer dx'd 02/2013    ALLERGIES:  is allergic to penicillins.  MEDICATIONS:  Current Outpatient Prescriptions  Medication Sig Dispense Refill  . cholecalciferol (VITAMIN D) 1000 UNITS tablet Take 3,000 Units by mouth daily. Takes 3 capsules      . ferrous sulfate 325 (65 FE) MG tablet Take 325 mg by mouth daily with breakfast.      . HYDROcodone-homatropine (HYCODAN) 5-1.5 MG/5ML syrup Take 5 mLs by mouth every 6 (six) hours as needed for cough.  120 mL  0  . alendronate (FOSAMAX) 70 MG tablet Take 70 mg by mouth every 7 (seven) days.        No current facility-administered medications for this visit.    SURGICAL HISTORY:  Past Surgical History  Procedure Laterality Date  . Cholecystectomy    . Right oophorectomy      1969    REVIEW OF SYSTEMS:  A comprehensive review of systems was negative except for: Respiratory: positive for cough and dyspnea on exertion   PHYSICAL EXAMINATION: General appearance: alert, cooperative and no distress Head: Normocephalic, without obvious abnormality, atraumatic Neck: no adenopathy, no JVD, supple, symmetrical, trachea midline and thyroid not enlarged, symmetric, no tenderness/mass/nodules Lymph nodes: Cervical, supraclavicular, and axillary nodes normal. Resp: clear to auscultation bilaterally Back: symmetric, no curvature. ROM normal. No CVA tenderness. Cardio: regular rate and rhythm, S1, S2 normal, no murmur, click, rub or gallop GI: soft, non-tender; bowel sounds normal; no masses,  no organomegaly Extremities: extremities normal, atraumatic, no cyanosis or edema Neurologic: Alert and oriented  X 3, normal strength and tone. Normal symmetric reflexes. Normal coordination and gait  ECOG PERFORMANCE STATUS: 1 - Symptomatic but completely ambulatory  There were no vitals taken for this visit.  LABORATORY DATA: Lab Results  Component Value Date   WBC 8.1 07/31/2014   HGB  12.1 07/31/2014   HCT 37.9 07/31/2014   MCV 89.7 07/31/2014   PLT 368 07/31/2014      Chemistry      Component Value Date/Time   NA 141 07/31/2014 1518   K 3.9 07/31/2014 1518   CL 98 04/09/2013 1114   CO2 26 07/31/2014 1518   BUN 8.3 07/31/2014 1518   CREATININE 0.9 07/31/2014 1518   CREATININE 0.79 03/16/2013 1500      Component Value Date/Time   CALCIUM 9.9 07/31/2014 1518   ALKPHOS 74 07/31/2014 1518   AST 17 07/31/2014 1518   ALT 17 07/31/2014 1518   BILITOT 0.61 07/31/2014 1518       RADIOGRAPHIC STUDIES: Ct Chest W Contrast  08/01/2014   CLINICAL DATA:  Restaging lung cancer. Subsequent treatment strategy. Chemotherapy and radiation therapy complete  EXAM: CT CHEST WITH CONTRAST  TECHNIQUE: Multidetector CT imaging of the chest was performed during intravenous contrast administration.  CONTRAST:  65mL OMNIPAQUE IOHEXOL 300 MG/ML  SOLN  COMPARISON:  None.  FINDINGS: No axillary or supraclavicular lymphadenopathy. No mediastinal hilar lymphadenopathy. Small pericardial effusion is noted is similar to comparison exam. Esophagus is gas-filled but appears normal. No central pulmonary embolism is evident.  Review of the lung parenchyma demonstrates volume loss in the left hemi thorax. There is perihilar consolidation, air bronchograms and linear thickening which is not changed from comparison exam. There is partially loculated pleural fluid collection is also similar prior.  Within the left upper lobe there is new linear fibrotic pattern with mild nodularity. This is best seen on coronal image 70.  No focal hepatic lesion. Adrenal glands are mildly thickened similar prior. Limited view of the liver, pancreas, and kidneys are normal. No aggressive osseous lesion.  IMPRESSION: 1. Increased linear nodular thickening in the left upper lobe is likely an extension of the posttherapy benign process. Recommend attention on follow-up. 2. Stable left perihilar fibrotic consolidation and pleural  fluid. 3. No convincing evidence of tumor recurrence. Recommend close attention on follow-up.   Electronically Signed   By: Suzy Bouchard M.D.   On: 08/01/2014 09:18   ASSESSMENT AND PLAN:  She is a very pleasant 73 years old white female with unresectable stage IIB non-small cell lung cancer. She status post concurrent chemoradiation and recently completed 3 cycles of consolidation chemotherapy with carboplatin and paclitaxel. Her CT scan of the chest showed stable disease. I discussed the scan results with the patient and her daughter today. I recommended for her to continue on observation for now with repeat CT scan of the chest in 4 months. She was advised to call immediately if she has any concerning symptoms in the interval.  The patient voices understanding of current disease status and treatment options and is in agreement with the current care plan.  All questions were answered. The patient knows to call the clinic with any problems, questions or concerns. We can certainly see the patient much sooner if necessary.  Disclaimer: This note was dictated with voice recognition software. Similar sounding words can inadvertently be transcribed and may not be corrected upon review.   Eilleen Kempf., MD 08/07/2014

## 2014-08-07 NOTE — Telephone Encounter (Signed)
gv adn printed appt sched and avs for pt for Feb 2016

## 2014-10-23 ENCOUNTER — Other Ambulatory Visit: Payer: Self-pay | Admitting: Family Medicine

## 2014-10-23 DIAGNOSIS — R4789 Other speech disturbances: Secondary | ICD-10-CM

## 2014-10-24 ENCOUNTER — Other Ambulatory Visit: Payer: Self-pay

## 2014-10-28 ENCOUNTER — Ambulatory Visit
Admission: RE | Admit: 2014-10-28 | Discharge: 2014-10-28 | Disposition: A | Discharge status: Home / Self Care | Payer: Medicare Other | Source: Ambulatory Visit | Attending: Family Medicine | Admitting: Family Medicine

## 2014-10-28 ENCOUNTER — Other Ambulatory Visit: Payer: Self-pay | Admitting: Radiation Therapy

## 2014-10-28 ENCOUNTER — Other Ambulatory Visit: Payer: Self-pay | Admitting: Family Medicine

## 2014-10-28 ENCOUNTER — Other Ambulatory Visit: Payer: Self-pay | Admitting: Internal Medicine

## 2014-10-28 DIAGNOSIS — C7931 Secondary malignant neoplasm of brain: Secondary | ICD-10-CM

## 2014-10-28 DIAGNOSIS — R4789 Other speech disturbances: Secondary | ICD-10-CM

## 2014-10-28 DIAGNOSIS — C3432 Malignant neoplasm of lower lobe, left bronchus or lung: Secondary | ICD-10-CM

## 2014-10-28 MED ORDER — GADOBENATE DIMEGLUMINE 529 MG/ML IV SOLN
12.0000 mL | Freq: Once | INTRAVENOUS | Status: AC | PRN
  Administered 2014-10-28: 12 mL via INTRAVENOUS

## 2014-10-29 ENCOUNTER — Other Ambulatory Visit: Payer: Self-pay | Admitting: Neurosurgery

## 2014-10-30 ENCOUNTER — Encounter: Payer: Self-pay | Admitting: Radiation Oncology

## 2014-10-30 DIAGNOSIS — C7931 Secondary malignant neoplasm of brain: Secondary | ICD-10-CM | POA: Insufficient documentation

## 2014-10-30 NOTE — Progress Notes (Signed)
  Radiation Oncology         (336) [phone] ________________________________  Name: Chelsea Cruz  MRN: [id number]  Date: 10/31/2014  DOB: 1941/01/12  SIMULATION AND TREATMENT PLANNING NOTE    ICD-9-CM ICD-10-CM   1. 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  Identity was confirmed.  All relevant records and images related to the planned course of therapy were reviewed.  The patient freely provided informed written consent to proceed with treatment after reviewing the details related to the planned course of therapy. The consent form was witnessed and verified by the simulation staff. Intravenous access was established for contrast administration. Then, the patient was set-up in a stable reproducible supine position for radiation therapy.  A relocatable thermoplastic stereotactic head frame was fabricated for precise immobilization.  CT images were obtained.  Surface markings were placed.  The CT images were loaded into the planning software and fused with the patient's targeting MRI scan.  Then the target and avoidance structures were contoured.  Treatment planning then occurred.  The radiation prescription was entered and confirmed.  I have requested 3D planning  I have requested a DVH of the following structures: Brain stem, brain, left eye, right eye, lenses, optic chiasm, target volumes, uninvolved brain, and normal tissue.    PLAN:  The patient will receive 15 Gy in one fraction with preoperative intent to be followed by resection of the metastasis..  ________________________________  Sheral Apley Tammi Klippel, M.D.

## 2014-10-30 NOTE — Progress Notes (Signed)
Location/Histology of Brain Tumor: Solitary enhancing anterior left frontal lobe mass measures 2.9 x 2.8 x 3.4 cm. Internal hemorrhage is noted  Patient presented with symptoms of:  Difficulty finding words  Past or anticipated interventions, if any, per neurosurgery: pre op SRS followed by excision  Past or anticipated interventions, if any, per medical oncology:  PRIOR THERAPY:  1) Concurrent chemoradiation with weekly carboplatin for AUC of 2 and paclitaxel 45 mg/M2. Status post 5 cycles. First cycle was given on 04/09/2013. Last dose was given on 05/21/2013 with partial response.  2) Consolidation chemotherapy with carboplatin for AUC of 4 and paclitaxel 150 mg/M2 every 3 weeks with Neulasta support. First dose given on 07/31/2013. Status post 3 cycles with stable disease.  CURRENT THERAPY: Observation   Dose of Decadron, if applicable:   Recent neurologic symptoms, if any:   Seizures: no  Headaches: no  Nausea: no  Dizziness/ataxia: no  Difficulty with hand coordination: no  Focal numbness/weakness: no  Visual deficits/changes: no  Confusion/Memory deficits: difficulty finding words  Painful bone metastases at present, if any: no  SAFETY ISSUES:  Prior radiation? yes  Pacemaker/ICD? no  Possible current pregnancy? no  Is the patient on methotrexate? no  Additional Complaints / other details: 74 year old female. Complains of dry cough, SOB with exertion and difficulty findings words.

## 2014-10-31 ENCOUNTER — Ambulatory Visit
Admission: RE | Admit: 2014-10-31 | Discharge: 2014-10-31 | Disposition: A | Discharge status: Home / Self Care | Payer: Medicare Other | Source: Ambulatory Visit | Attending: Radiation Oncology | Admitting: Radiation Oncology

## 2014-10-31 ENCOUNTER — Encounter: Payer: Self-pay | Admitting: Radiation Oncology

## 2014-10-31 VITALS — BP 155/63 | HR 62 | Resp 16 | Ht 63.0 in | Wt 132.9 lb

## 2014-10-31 DIAGNOSIS — Z7952 Long term (current) use of systemic steroids: Secondary | ICD-10-CM | POA: Insufficient documentation

## 2014-10-31 DIAGNOSIS — Z510 Encounter for antineoplastic radiation therapy: Secondary | ICD-10-CM | POA: Insufficient documentation

## 2014-10-31 DIAGNOSIS — C3432 Malignant neoplasm of lower lobe, left bronchus or lung: Secondary | ICD-10-CM

## 2014-10-31 DIAGNOSIS — C7931 Secondary malignant neoplasm of brain: Secondary | ICD-10-CM | POA: Diagnosis not present

## 2014-10-31 DIAGNOSIS — Z9221 Personal history of antineoplastic chemotherapy: Secondary | ICD-10-CM | POA: Insufficient documentation

## 2014-10-31 DIAGNOSIS — Z85118 Personal history of other malignant neoplasm of bronchus and lung: Secondary | ICD-10-CM | POA: Diagnosis not present

## 2014-10-31 MED ORDER — SODIUM CHLORIDE 0.9 % IJ SOLN
10.0000 mL | Freq: Once | INTRAMUSCULAR | Status: AC
  Administered 2014-10-31: 10 mL via INTRAVENOUS

## 2014-10-31 NOTE — Progress Notes (Signed)
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 Solitary Left Frontal 3.4 cm Brain Metastasis 198.3 C79.31     Interval Since Last Radiation:  17  months  Narrative:  The patient returns today for consult.  She has a history of lung cancer and developed acute changing word-finding ability. This prompted a brain MRI by her primary care physician.  The MRI demonstrated a solitary enhancing anterior left frontal lobe mass measures 2.9 x 2.8 x 3.4 cm. Internal hemorrhage is noted. Internal restricted diffusion suggests a high-grade lesion. Significant surrounding vasogenic edema is present. This is most compatible with a solitary metastasis  She was started on dexamethasone and referred for possible radiation treatment.  Reports recall and difficulty findings words has greatly improved since she began taking decadron 4 mg tid. Reports she has not experience any headaches until this morning when she had a brief left temporal headache that resolved on its own. Also, reports dizziness this morning that resolved quickly. Denies nausea or vomiting. Steady gait noted. Systolic bp slightly elevated. Denies pain. Weight stable. Denies diplopia or ringing in the ears.                                ALLERGIES:  is allergic to penicillins.  Meds: Current Outpatient Prescriptions  Medication Sig Dispense Refill  . cholecalciferol (VITAMIN D) 1000 UNITS tablet Take 3,000 Units by mouth daily. Takes 3 capsules    . dexamethasone (DECADRON) 4 MG tablet    0  . lisinopril (PRINIVIL,ZESTRIL) 10 MG tablet   1  . alendronate (FOSAMAX) 70 MG tablet Take 70 mg by mouth every 7 (seven) days.     . ferrous sulfate 325 (65 FE) MG tablet Take 325 mg by mouth daily with breakfast.    . HYDROcodone-homatropine (HYCODAN) 5-1.5 MG/5ML syrup Take 5 mLs by mouth every 6 (six) hours as needed for cough. (Patient not taking: Reported on 10/31/2014) 120 mL 0   No current facility-administered medications for this encounter.    Physical Findings: The patient is in no acute distress. Patient is alert and oriented.  height is 5\' 3"  (1.6 m) and weight is 132 lb 14.4 oz (60.283 kg). Her blood pressure is 155/63 and her pulse is 62. Her respiration is 16. . The patient's speech is fluent articulate. Motor strength is intact throughout. No significant changes.  Lab Findings: Lab Results  Component Value Date   WBC 8.1 07/31/2014   HGB 12.1 07/31/2014   HCT 37.9 07/31/2014   MCV 89.7 07/31/2014   PLT 368 07/31/2014    @LASTCHEM @  Radiographic Findings: Mr [name] PX Contrast  10/28/2014   CLINICAL DATA:  Lung cancer. Acute changing word-finding ability. Word-finding difficulty. Non-small cell lung cancer, poorly differentiated.  EXAM: MRI HEAD WITHOUT AND WITH CONTRAST  TECHNIQUE: Multiplanar, multiecho pulse sequences of the brain and surrounding structures were obtained without and with intravenous contrast.  CONTRAST:  63mL MULTIHANCE GADOBENATE DIMEGLUMINE 529 MG/ML IV SOLN  COMPARISON:  MRI of the brain 03/16/2013.  FINDINGS: Enhancing the left anterior frontal lobe mass is new since prior study. This lesion measures 2.9 x 2.8 x 3.4 cm. There is significant surrounding vasogenic edema with partial effacement of the left lateral ventricle and effacement of the adjacent sulci. White matter changes extend posteriorly into the parietal lobe and within the left external capsule. Midline shift is evident with the corpus callosum 4 mm to the right of midline.  No  additional foci of pathologic enhancement are present. Linear enhancement within the anterior inferior left frontal lobe is compatible with a benign venous angioma. Mild periventricular white matter changes are otherwise stable. No acute infarct is present. Susceptibility within the central portion of the lesions suggests some internal hemorrhage. No other acute hemorrhage is present. There is also restricted diffusion within the central portion of the tumor, suggesting a high-grade lesion.  Flow is present in the major intracranial arteries. Bilateral lens extractions are noted. Rightward nasal septal deviation is evident. The paranasal sinuses are clear. There is fluid in the left mastoid air cells. No obstructing nasopharyngeal lesion is present.  IMPRESSION: 1. Solitary enhancing anterior left frontal lobe mass measures 2.9 x 2.8 x 3.4 cm. Internal hemorrhage is noted. Internal restricted diffusion suggests a high-grade lesion. Significant surrounding vasogenic edema is present. This is most compatible with a solitary metastasis. 2. Significant mass effect with midline shift of up to 4 mm. 3. Otherwise stable white matter disease. 4. Left mastoid fluid without an obstructing nasopharyngeal lesion.   Electronically Signed   By: Lawrence Santiago M.D.   On: 10/28/2014 11:19    Impression:  The patient has a solitary brain metastasis.  At this point, the patient would benefit from surgical resection of the brain metastasis.* In addition, the patient would potentially benefit from radiotherapy.* The options include whole brain irradiation versus stereotactic radiosurgery. There are pros and cons associated with each of these potential treatment options. Whole brain radiotherapy would treat the known metastatic deposits and help provide some reduction of risk for future brain metastases. However, whole brain radiotherapy carries potential risks including hair loss, subacute somnolence, and neurocognitive changes  including a possible reduction in short-term memory. Whole brain radiotherapy also may carry a lower likelihood of tumor control at the treatment sites because of the low-dose used. Stereotactic radiosurgery carries a higher likelihood for local tumor control at the targeted sites with lower associated risk for neurocognitive changes such as memory loss.* However, the use of stereotactic radiosurgery in this setting may leave the patient at increased risk for new brain metastases elsewhere in the brain as high as 50-60%. Accordingly, patients who receive stereotactic radiosurgery in this setting should undergo ongoing surveillance imaging with brain MRI more frequently in order to identify and treat new small brain metastases before they become symptomatic. Stereotactic radiosurgery does carry some different risks, including a risk of radionecrosis.  PLAN: Today, I reviewed the findings and workup thus far with the patient. We discussed the options regarding whole brain radiotherapy versus stereotactic radiosurgery. We discussed the pros and cons of each. We also discussed the logistics and delivery of each. We reviewed the results associated with each of the treatments described above. The patient seems to understand the treatment options and would like to proceed with stereotactic radiosurgery.  In terms of timing of the stereotactic radiosurgery, evidence suggests that risk of radionecrosis and leptomeningeal recurrence is lower when radiosurgery is used in the pre-operative setting as opposed to post-operative  SRS.*  The patient will be set up for [facility] CT Simulation, SRS Treatment, and Surgical resection.  I spent face to face time with the patient and more than 50% of that time was spent in counseling and/or coordination of care.    ------------------------------------------------  Sheral Apley. Tammi Klippel, M.D.    *References:  1: Patchell RA, Tibbs PA, Helene Shoe, [address]  RJ, [place], Guy Sandifer JS, Young B. A randomized trial of surgery in the treatment of single metastases to the brain. [place] Feb 22;322(8):494-500. PubMed PMID: [id number].   2: Patchell RA, Tibbs PA, Regine WF, Jonita Albee, Mohiuddin M, Arrie Eastern, [place], [place], Young B. Postoperative radiotherapy in the treatment of single metastases to the brain: a randomized trial. JAMA. 1998 Nov 4;280(17):1485-9. PubMed PMID: 3462194.   3: Erlene Senters, Wenda Low, Hess KR, Tomie China, Lang FF, Kornguth DG, [place], Swint JM, Shiu AS, Maor MH, [place]. Neurocognition in patients with brain metastases treated with radiosurgery or radiosurgery plus whole-brain irradiation: a randomised controlled trial. Lancet Oncol. 2009 Nov;10(11):1037-44. doi: 10.1016/S1470-2045(09)70263-3. Epub 2009 Oct 2. PubMed PMID: 71252712.  4: Weyman Rodney, Estill Dooms, Coralee Pesa, Crocker IR, Lorie Phenix, Charlesetta Garibaldi, Press RH, Tanya Nones, [place], Wait SD, Higinio Plan, Shu HG, [place]. Comparing Preoperative With Postoperative Stereotactic Radiosurgery for Resectable Brain Metastases: A Multi-institutional Analysis. Neurosurgery. 2015 Nov 2. [Epub ahead of print] PubMed PMID: 92909030.

## 2014-10-31 NOTE — Progress Notes (Signed)
Prior to start IV this RN confirm labs with [facility] @ Dr. Abigail Butts McNeill's office. BUN 16 and creatinine 1.00 collected 10/21/2013. Dr. Addison Lank is the patient's PCP and her office number is [phone]. Patient denies taking metformin. Patient denies having a pacemaker. Patient confirms history of radiation therapy.

## 2014-10-31 NOTE — Progress Notes (Signed)
Started right AC 22 gauge IV on second attempt. Patient tolerated well. Excellent blood returned and flushed without complication. Secured IV in place. Escorted patient to CT.

## 2014-10-31 NOTE — Progress Notes (Signed)
See progress note under physician encounter. 

## 2014-10-31 NOTE — Progress Notes (Signed)
Right AC 22 gauge IV removed by sim staff.

## 2014-10-31 NOTE — Progress Notes (Signed)
Reports recall and difficulty findings words has greatly improved since she began taking decadron 4 mg tid. Reports she has not experience any headaches until this morning when she had a brief left temporal headache that resolved on its own. Also, reports dizziness this morning that resolved quickly. Denies nausea or vomiting. Steady gait noted. Systolic bp slightly elevated. Denies pain. Weight stable. Denies diplopia or ringing in the ears.

## 2014-11-01 DIAGNOSIS — Z510 Encounter for antineoplastic radiation therapy: Secondary | ICD-10-CM | POA: Diagnosis not present

## 2014-11-01 NOTE — Addendum Note (Signed)
Encounter addended by: Lora Paula, MD on: 11/01/2014  7:31 PM<BR>     Documentation filed: Follow-up Section, LOS Section

## 2014-11-05 ENCOUNTER — Encounter (HOSPITAL_COMMUNITY): Payer: Self-pay

## 2014-11-05 ENCOUNTER — Encounter (HOSPITAL_COMMUNITY)
Admission: RE | Admit: 2014-11-05 | Discharge: 2014-11-05 | Disposition: A | Discharge status: Home / Self Care | Payer: Medicare Other | Source: Ambulatory Visit | Attending: Neurosurgery | Admitting: Neurosurgery

## 2014-11-05 DIAGNOSIS — C7931 Secondary malignant neoplasm of brain: Secondary | ICD-10-CM | POA: Insufficient documentation

## 2014-11-05 DIAGNOSIS — Z510 Encounter for antineoplastic radiation therapy: Secondary | ICD-10-CM | POA: Diagnosis not present

## 2014-11-05 HISTORY — DX: Chronic obstructive pulmonary disease, unspecified: J44.9

## 2014-11-05 HISTORY — DX: Gastro-esophageal reflux disease without esophagitis: K21.9

## 2014-11-05 LAB — TYPE AND SCREEN
ABO/RH(D): A NEG
ANTIBODY SCREEN: NEGATIVE

## 2014-11-05 LAB — SURGICAL PCR SCREEN
MRSA, PCR: NEGATIVE
STAPHYLOCOCCUS AUREUS: NEGATIVE

## 2014-11-05 LAB — CBC
HCT: 40.9 % (ref 36.0–46.0)
Hemoglobin: 13.7 g/dL (ref 12.0–15.0)
MCH: 28.7 pg (ref 26.0–34.0)
MCHC: 33.5 g/dL (ref 30.0–36.0)
MCV: 85.7 fL (ref 78.0–100.0)
PLATELETS: 344 10*3/uL (ref 150–400)
RBC: 4.77 MIL/uL (ref 3.87–5.11)
RDW: 15.3 % (ref 11.5–15.5)
WBC: 14.7 10*3/uL — ABNORMAL HIGH (ref 4.0–10.5)

## 2014-11-05 LAB — BASIC METABOLIC PANEL
Anion gap: 6 (ref 5–15)
BUN: 28 mg/dL — AB (ref 6–23)
CALCIUM: 9.1 mg/dL (ref 8.4–10.5)
CO2: 28 mmol/L (ref 19–32)
Chloride: 105 mEq/L (ref 96–112)
Creatinine, Ser: 0.97 mg/dL (ref 0.50–1.10)
GFR calc Af Amer: 65 mL/min — ABNORMAL LOW (ref >90)
GFR calc non Af Amer: 56 mL/min — ABNORMAL LOW (ref >90)
GLUCOSE: 98 mg/dL (ref 70–99)
POTASSIUM: 4.3 mmol/L (ref 3.5–5.1)
SODIUM: 139 mmol/L (ref 135–145)

## 2014-11-05 LAB — ABO/RH: ABO/RH(D): A NEG

## 2014-11-05 NOTE — Pre-Procedure Instructions (Signed)
Chelsea Cruz  11/05/2014   Your procedure is scheduled on:  Friday, January 22nd  Report to [facility] Admitting at 530 AM.  Call this number if you have problems the morning of surgery: [phone]   Remember:   Do not eat food or drink liquids after midnight.   Take these medicines the morning of surgery with A SIP OF WATER: none   Do not wear jewelry, make-up or nail polish.  Do not wear lotions, powders, or perfume, deodorant.  Do not shave 48 hours prior to surgery. Men may shave face and neck.  Do not bring valuables to the hospital.  [facility] is not responsible for any belongings or valuables.               Contacts, dentures or bridgework may not be worn into surgery.  Leave suitcase in the car. After surgery it may be brought to your room.  For patients admitted to the hospital, discharge time is determined by your treatment team.             Please read over the following fact sheets that you were given: Pain Booklet, Coughing and Deep Breathing, Blood Transfusion Information and Surgical Site Infection Prevention  [place] - Preparing for Surgery  Before surgery, you can play an important role.  Because skin is not sterile, your skin needs to be as free of germs as possible.  You can reduce the number of germs on you skin by washing with CHG (chlorahexidine gluconate) soap before surgery.  CHG is an antiseptic cleaner which kills germs and bonds with the skin to continue killing germs even after washing.  Please DO NOT use if you have an allergy to CHG or antibacterial soaps.  If your skin becomes reddened/irritated stop using the CHG and inform your nurse when you arrive at Short Stay.  Do not shave (including legs and underarms) for at least 48 hours prior to the first CHG shower.  You may shave your face.  Please follow these instructions carefully:   1.  Shower with CHG Soap the night before surgery and the morning of Surgery.  2.  If you choose to  wash your hair, wash your hair first as usual with your normal shampoo.  3.  After you shampoo, rinse your hair and body thoroughly to remove the shampoo.  4.  Use CHG as you would any other liquid soap.  You can apply CHG directly to the skin and wash gently with scrungie or a clean washcloth.  5.  Apply the CHG Soap to your body ONLY FROM THE NECK DOWN.  Do not use on open wounds or open sores.  Avoid contact with your eyes, ears, mouth and genitals (private parts).  Wash genitals (private parts) with your normal soap.  6.  Wash thoroughly, paying special attention to the area where your surgery will be performed.  7.  Thoroughly rinse your body with warm water from the neck down.  8.  DO NOT shower/wash with your normal soap after using and rinsing off the CHG Soap.  9.  Pat yourself dry with a clean towel.            10.  Wear clean pajamas.            11.  Place clean sheets on your bed the night of your first shower and do not sleep with pets.  Day of Surgery  Do not apply any lotions/deoderants the  morning of surgery.  Please wear clean clothes to the hospital/surgery center.

## 2014-11-05 NOTE — H&P (Signed)
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 Solitary Left Frontal 3.4 cm Brain Metastasis 198.3 C79.31     Interval Since Last Radiation: 17 months  Narrative: I met with patient to review her imaging and treatment options. She has a history of lung cancer and developed acute changing word-finding ability. This prompted a brain MRI by her primary care physician. The MRI demonstrated a solitary enhancing anterior left frontal lobe mass measures 2.9 x 2.8 x 3.4 cm. Internal hemorrhage is noted. Internal restricted diffusion suggests a high-grade lesion. Significant surrounding vasogenic edema is present. This is most compatible with a solitary metastasis.  The patient developed an expressive aphasia with word-finding difficulties.  She was started on dexamethasone and referred for possible radiation treatment and, based on review of her imaging, patient was felt to be good candidate for preoperative SRS and tumor resection.  Reports recall and difficulty findings words has greatly improved since she began taking decadron 4 mg tid. Reports she has not experience any headaches until this morning when she had a brief left temporal headache that resolved on its own. Also, reports dizziness this morning that resolved quickly. Denies nausea or vomiting. Steady gait noted. Systolic bp slightly elevated. Denies pain. Weight stable. Denies diplopia or ringing in the ears.      ALLERGIES: is allergic to  penicillins.  Meds: Current Outpatient Prescriptions  Medication Sig Dispense Refill  . cholecalciferol (VITAMIN D) 1000 UNITS tablet Take 3,000 Units by mouth daily. Takes 3 capsules    . dexamethasone (DECADRON) 4 MG tablet   0  . lisinopril (PRINIVIL,ZESTRIL) 10 MG tablet   1  . alendronate (FOSAMAX) 70 MG tablet Take 70 mg by mouth every 7 (seven) days.     . ferrous sulfate 325 (65 FE) MG tablet Take 325 mg by mouth daily with breakfast.    . HYDROcodone-homatropine (HYCODAN) 5-1.5 MG/5ML syrup Take 5 mLs by mouth every 6 (six) hours as needed for cough. (Patient not taking: Reported on 10/31/2014) 120 mL 0   No current facility-administered medications for this encounter.    Physical Findings: The patient is in no acute distress. Patient is alert and oriented.  height is _0  (1.6 m) and weight is 132 lb 14.4 oz (60.283 kg). Her blood pressure is 155/63 and her pulse is 62. Her respiration is 16. . The patient's speech is fluent articulate. Motor strength is intact throughout. No significant changes.  PERRL, EOMI.  VFF to confrontational testing.  Patient does not demonstrate difficulties with speech today, either spontaneous, comprehension, repetition, or naming.  Occasional paraphasias.  Lab Findings:  Recent Labs    Lab Results  Component Value Date   WBC 8.1 07/31/2014   HGB 12.1 07/31/2014   HCT 37.9 07/31/2014   MCV 89.7 07/31/2014   PLT 368 07/31/2014      _1 @  Radiographic Findings:  Imaging Results    Mr [name] XL Contrast  10/28/2014 CLINICAL DATA: Lung cancer. Acute changing word-finding ability. Word-finding difficulty. Non-small cell lung cancer, poorly differentiated. EXAM: MRI HEAD WITHOUT AND WITH CONTRAST TECHNIQUE: Multiplanar, multiecho pulse sequences of the brain and  surrounding structures were obtained without and with intravenous contrast. CONTRAST: 27m MULTIHANCE GADOBENATE  DIMEGLUMINE 529 MG/ML IV SOLN COMPARISON: MRI of the brain 03/16/2013. FINDINGS: Enhancing the left anterior frontal lobe mass is new since prior study. This lesion measures 2.9 x 2.8 x 3.4 cm. There is significant surrounding vasogenic edema with partial effacement of the left lateral ventricle and effacement of the adjacent sulci. White matter changes extend posteriorly into the parietal lobe and within the left external capsule. Midline shift is evident with the corpus callosum 4 mm to the right of midline. No additional foci of pathologic enhancement are present. Linear enhancement within the anterior inferior left frontal lobe is compatible with a benign venous angioma. Mild periventricular white matter changes are otherwise stable. No acute infarct is present. Susceptibility within the central portion of the lesions suggests some internal hemorrhage. No other acute hemorrhage is present. There is also restricted diffusion within the central portion of the tumor, suggesting a high-grade lesion. Flow is present in the major intracranial arteries. Bilateral lens extractions are noted. Rightward nasal septal deviation is evident. The paranasal sinuses are clear. There is fluid in the left mastoid air cells. No obstructing nasopharyngeal lesion is present. IMPRESSION: 1. Solitary enhancing anterior left frontal lobe mass measures 2.9 x 2.8 x 3.4 cm. Internal hemorrhage is noted. Internal restricted diffusion suggests a high-grade lesion. Significant surrounding vasogenic edema is present. This is most compatible with a solitary metastasis. 2. Significant mass effect with midline shift of up to 4 mm. 3. Otherwise stable white matter disease. 4. Left mastoid fluid without an obstructing nasopharyngeal lesion. Electronically Signed By: CLawrence SantiagoM.D. On: 10/28/2014 11:19     Impression: The patient has a solitary brain metastasis. At this point, the patient would benefit from surgical resection  of the brain metastasis.* In addition, the patient would potentially benefit from radiotherapy.* The options include whole brain irradiation versus stereotactic radiosurgery. There are pros and cons associated with each of these potential treatment options. Whole brain radiotherapy would treat the known metastatic deposits and help provide some reduction of risk for future brain metastases. However, whole brain radiotherapy carries potential risks including hair loss, subacute somnolence, and neurocognitive changes including a possible reduction in short-term memory. Whole brain radiotherapy also may carry a lower likelihood of tumor control at the treatment sites because of the low-dose used. Stereotactic radiosurgery carries a higher likelihood for local tumor control at the targeted sites with lower associated risk for neurocognitive changes such as memory loss.* However, the use of stereotactic radiosurgery in this setting may leave the patient at increased risk for new brain metastases elsewhere in the brain as high as 50-60%. Accordingly, patients who receive stereotactic radiosurgery in this setting should undergo ongoing surveillance imaging with brain MRI more frequently in order to identify and treat new small brain metastases before they become symptomatic. Stereotactic radiosurgery does carry some different risks, including a risk of radionecrosis.  PLAN: Today, I reviewed the findings and workup thus far with the patient. We discussed the options regarding whole brain radiotherapy versus stereotactic radiosurgery. We discussed the pros and cons of each. We also discussed the logistics and delivery of each. We reviewed the results associated with each of the treatments described above. The patient seems to understand the treatment options and would like to proceed with stereotactic radiosurgery and operative resection of this large solitary metastasis.  In terms of timing of the stereotactic  radiosurgery, evidence suggests that risk of radionecrosis and leptomeningeal recurrence  is lower when radiosurgery is used in the pre-operative setting as opposed to post-operative SRS.*  The patient has been set up for [facility] CT Simulation, SRS Treatment, and Surgical resection.  The risks of either temporary or permanent speech difficulties, in particular, were discussed with the patient and her daughters.   ------------------------------------------------  Marchia Meiers. Vertell Limber, M.D.    *References:  1: Patchell RA, Tibbs PA, Helene Shoe, [address] RJ, [place], [place] JS, [place]. A randomized trial of surgery in the treatment of single metastases to the brain. [place] Feb 22;322(8):494-500. PubMed PMID: [id number].   2: Patchell RA, Tibbs PA, Regine WF, Jonita Albee, Mohiuddin M, Arrie Eastern, [place], [place], [place] B. Postoperative radiotherapy in the treatment of single metastases to the brain: a randomized trial. JAMA. 1998 Nov 4;280(17):1485-9. PubMed PMID: 1478295.   3: Erlene Senters, Wenda Low, Hess KR, Tomie China, Lang FF, Kornguth DG, [place], Swint JM, Shiu AS, Maor MH, [place]. Neurocognition in patients with brain metastases treated with radiosurgery or radiosurgery plus whole-brain irradiation: a randomised controlled trial. Lancet Oncol. 2009 Nov;10(11):1037-44. doi: 10.1016/S1470-2045(09)70263-3. Epub 2009 Oct 2. PubMed PMID: 62130865.  4: Weyman Rodney, Estill Dooms, Coralee Pesa, Crocker IR, Lorie Phenix, Charlesetta Garibaldi, Press RH, Tanya Nones, [place], Wait SD, Higinio Plan, Shu HG, [place]. Comparing Preoperative With Postoperative Stereotactic Radiosurgery for Resectable Brain Metastases: A Multi-institutional Analysis. Neurosurgery. 2015 Nov 2. [Epub ahead of print] PubMed PMID: [id number].

## 2014-11-06 ENCOUNTER — Ambulatory Visit
Admission: RE | Admit: 2014-11-06 | Discharge: 2014-11-06 | Disposition: A | Discharge status: Home / Self Care | Payer: Medicare Other | Source: Ambulatory Visit | Attending: Radiation Oncology | Admitting: Radiation Oncology

## 2014-11-06 ENCOUNTER — Encounter: Payer: Self-pay | Admitting: Radiation Oncology

## 2014-11-06 VITALS — BP 137/56 | HR 63 | Temp 98.3°F | Resp 16

## 2014-11-06 DIAGNOSIS — Z510 Encounter for antineoplastic radiation therapy: Secondary | ICD-10-CM | POA: Diagnosis not present

## 2014-11-06 DIAGNOSIS — C7931 Secondary malignant neoplasm of brain: Secondary | ICD-10-CM

## 2014-11-06 NOTE — Progress Notes (Signed)
Received patient in the clinic following [place] treatment. Patient accompanied by daughter. No distress noted. Steady gait noted. Vitals stable. Denies pain. Pleasant affect noted. Denies nausea or dizziness. Will monitor patient for 30 minutes then, discharge home with daughter. Patient understands to contact staff with needs. One month follow up appt card given by Mont Dutton.

## 2014-11-06 NOTE — Progress Notes (Signed)
  Radiation Oncology         ([phone]) [phone] ________________________________  Stereotactic Treatment Procedure Note  Name: Chelsea Cruz MRN: [id number]  Date: 11/06/2014  DOB: 11-11-1940  SPECIAL TREATMENT PROCEDURE    ICD-9-CM ICD-10-CM   1. 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  It showed 3-dimensional radiation distributions overlaid onto the planning CT/MRI image set.  The [facility] for the target structures as well as the organs at risk were reviewed. The documentation of the 3D plan and dosimetry are filed in the radiation oncology EMR.  NARRATIVE:  Chelsea Cruz was brought to the TrueBeam stereotactic radiation treatment machine and placed supine on the CT couch. The head frame was applied, and the patient was set up for stereotactic radiosurgery.  Neurosurgery was present for the set-up and delivery  SIMULATION VERIFICATION:  In the couch zero-angle position, the patient underwent Exactrac imaging using the Brainlab system with orthogonal KV images.  These were carefully aligned and repeated to confirm treatment position for each of the isocenters.  The Exactrac snap film verification was repeated at each couch angle.  SPECIAL TREATMENT PROCEDURE: Chelsea Cruz received stereotactic radiosurgery to the following targets: Left frontal 3.4 cm target was treated using 4 Dynamic Conformal Arcs to a prescription dose of 15 Gy.  ExacTrac registration was performed for each couch angle.  The 84.7% isodose line was prescribed.  6 MV X-rays were delivered in the flattening filter free beam mode. STEREOTACTIC TREATMENT MANAGEMENT:  Following delivery, the patient was transported to nursing in stable condition and monitored for possible acute effects.  Vital signs were recorded BP 137/56 mmHg  Pulse 63  Temp(Src) 98.3 F  (36.8 C) (Oral)  Resp 16  SpO2 96%. The patient tolerated treatment without significant acute effects, and was discharged to home in stable condition.    PLAN: Follow-up in one month.  ________________________________  Sheral Apley. Tammi Klippel, M.D.

## 2014-11-06 NOTE — Progress Notes (Signed)
Chelsea Cruz denies headache, pain, nausea, dizziness and blurred vision.  She was escorted to the lobby with her daughter.

## 2014-11-06 NOTE — Op Note (Signed)
  Name: Chelsea Cruz  MRN: [id number]  Date: 11/06/2014   DOB: [birth year]  Stereotactic Radiosurgery Operative Note  PRE-OPERATIVE DIAGNOSIS:  Solitary Brain Metastasis  POST-OPERATIVE DIAGNOSIS:  Solitary Brain Metastasis  PROCEDURE:  Stereotactic Radiosurgery  SURGEON:  Peggyann Shoals, MD  NARRATIVE: The patient underwent a radiation treatment planning session in the radiation oncology simulation suite under the care of the radiation oncology physician and physicist.  I participated closely in the radiation treatment planning afterwards. The patient underwent planning CT which was fused to 3T high resolution MRI with 1 mm axial slices.  These images were fused on the planning system.  We contoured the gross target volumes and subsequently expanded this to yield the Planning Target Volume. I actively participated in the planning process.  I helped to define and review the target contours and also the contours of the optic pathway, eyes, brainstem and selected nearby organs at risk.  All the dose constraints for critical structures were reviewed and compared to AAPM Task Group 101.  The prescription dose conformity was reviewed.  I approved the plan electronically.    Accordingly, Chelsea Cruz was brought to the TrueBeam stereotactic radiation treatment linac and placed in the custom immobilization mask.  The patient was aligned according to the IR fiducial markers with BrainLab Exactrac, then orthogonal x-rays were used in ExacTrac with the 6DOF robotic table and the shifts were made to align the patient  Chelsea Cruz received stereotactic radiosurgery uneventfully.    The detailed description of the procedure is recorded in the radiation oncology procedure note.  I was present for the duration of the procedure.  DISPOSITION:  Following delivery, the patient was transported to nursing in stable condition and monitored for possible acute effects to be discharged to home in stable condition  with follow-up in one month.  Peggyann Shoals, MD 11/06/2014 4:52 PM

## 2014-11-06 NOTE — Progress Notes (Addendum)
Anesthesia Chart Review:  Patient is a 74 year old female scheduled for craniotomy for tumor excision on 11/08/14 by Dr. Vertell Limber.  History includes lung cancer with recent findings worrisome for brain metastasis. She is scheduled for a SRS treatment this afternoon.    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 PCP is listed as Dr. Cari Caraway.  10/28/14 MRI brain:  IMPRESSION: 1. Solitary enhancing anterior left frontal lobe mass measures 2.9 x 2.8 x 3.4 cm. Internal hemorrhage is noted. Internal restricted diffusion suggests a high-grade lesion. Significant surrounding vasogenic edema is present. This is most compatible with a solitary metastasis. 2. Significant mass effect with midline shift of up to 4 mm. 3. Otherwise stable white matter disease. 4. Left mastoid fluid without an obstructing nasopharyngeal lesion.  EKG 11/05/14: SR, occasional premature supraventricular complexes, possible LAE, LAD, LVH with repolarization abnormality.  Records pending from Dr. Baldomero Lamy office, but currently there is no previous EKG tracing available.   Preoperative labs noted.  Glucose 98. WBC 14.7.  She is on Decadron.   I reviewed currently available records with anesthesiologist Dr. Conrad Westside.  Patient has no reported CAD/CHF history.  No CV symptoms documented from her PAT visit. She has suspected metastatic lung cancer to the brain and needs surgery.  If no acute changes/CV symptoms then would anticipate that she could proceed as planned.  George Hugh [facility]/Anesthesiology Phone [phone] 11/06/2014 3:55 PM

## 2014-11-07 MED ORDER — VANCOMYCIN HCL IN DEXTROSE 1-5 GM/200ML-% IV SOLN
1000.0000 mg | INTRAVENOUS | Status: AC
  Administered 2014-11-08: 1000 mg via INTRAVENOUS
  Filled 2014-11-07: qty 200

## 2014-11-07 NOTE — Progress Notes (Signed)
Unable to obtain an EKG from Dr Baldomero Lamy office.

## 2014-11-08 ENCOUNTER — Inpatient Hospital Stay (HOSPITAL_COMMUNITY): Payer: Medicare Other | Admitting: Vascular Surgery

## 2014-11-08 ENCOUNTER — Encounter (HOSPITAL_COMMUNITY): Payer: Self-pay | Admitting: Critical Care Medicine

## 2014-11-08 ENCOUNTER — Inpatient Hospital Stay (HOSPITAL_COMMUNITY): Payer: Medicare Other | Admitting: Critical Care Medicine

## 2014-11-08 ENCOUNTER — Encounter (HOSPITAL_COMMUNITY)
Admission: RE | Disposition: A | Discharge status: Home Health | Payer: Self-pay | Source: Ambulatory Visit | Attending: Neurosurgery

## 2014-11-08 ENCOUNTER — Inpatient Hospital Stay (HOSPITAL_COMMUNITY): Payer: Medicare Other

## 2014-11-08 ENCOUNTER — Inpatient Hospital Stay (HOSPITAL_COMMUNITY)
Admission: RE | Admit: 2014-11-08 | Discharge: 2014-11-12 | DRG: 025 | Disposition: A | Discharge status: Home Health | Payer: Medicare Other | Source: Ambulatory Visit | Attending: Neurosurgery | Admitting: Neurosurgery

## 2014-11-08 DIAGNOSIS — C3412 Malignant neoplasm of upper lobe, left bronchus or lung: Secondary | ICD-10-CM | POA: Diagnosis present

## 2014-11-08 DIAGNOSIS — Z923 Personal history of irradiation: Secondary | ICD-10-CM | POA: Diagnosis not present

## 2014-11-08 DIAGNOSIS — C3432 Malignant neoplasm of lower lobe, left bronchus or lung: Secondary | ICD-10-CM | POA: Diagnosis present

## 2014-11-08 DIAGNOSIS — Z452 Encounter for adjustment and management of vascular access device: Secondary | ICD-10-CM

## 2014-11-08 DIAGNOSIS — Z880 Allergy status to penicillin: Secondary | ICD-10-CM | POA: Diagnosis not present

## 2014-11-08 DIAGNOSIS — R4701 Aphasia: Secondary | ICD-10-CM | POA: Diagnosis present

## 2014-11-08 DIAGNOSIS — C7931 Secondary malignant neoplasm of brain: Principal | ICD-10-CM | POA: Diagnosis present

## 2014-11-08 DIAGNOSIS — Z79899 Other long term (current) drug therapy: Secondary | ICD-10-CM

## 2014-11-08 DIAGNOSIS — G936 Cerebral edema: Secondary | ICD-10-CM | POA: Diagnosis present

## 2014-11-08 DIAGNOSIS — Z9221 Personal history of antineoplastic chemotherapy: Secondary | ICD-10-CM | POA: Diagnosis not present

## 2014-11-08 HISTORY — PX: CRANIOTOMY: SHX93

## 2014-11-08 SURGERY — CRANIOTOMY TUMOR EXCISION
Anesthesia: General | Site: Head

## 2014-11-08 MED ORDER — ONDANSETRON HCL 4 MG/2ML IJ SOLN: 4.0000 mg | Freq: Once | INTRAMUSCULAR | Status: DC | PRN

## 2014-11-08 MED ORDER — SODIUM CHLORIDE 0.9 % IR SOLN
Status: DC | PRN
  Administered 2014-11-08 (×3): 1000 mL

## 2014-11-08 MED ORDER — BACITRACIN ZINC 500 UNIT/GM EX OINT
TOPICAL_OINTMENT | CUTANEOUS | Status: DC | PRN
  Administered 2014-11-08: 1 via TOPICAL

## 2014-11-08 MED ORDER — VANCOMYCIN HCL IN DEXTROSE 1-5 GM/200ML-% IV SOLN
1000.0000 mg | Freq: Once | INTRAVENOUS | Status: AC
  Administered 2014-11-08: 1000 mg via INTRAVENOUS
  Filled 2014-11-08: qty 200

## 2014-11-08 MED ORDER — ROCURONIUM BROMIDE 100 MG/10ML IV SOLN
INTRAVENOUS | Status: DC | PRN
  Administered 2014-11-08: 40 mg via INTRAVENOUS
  Administered 2014-11-08: 10 mg via INTRAVENOUS

## 2014-11-08 MED ORDER — DEXAMETHASONE SODIUM PHOSPHATE 10 MG/ML IJ SOLN
6.0000 mg | Freq: Four times a day (QID) | INTRAMUSCULAR | Status: AC
  Administered 2014-11-08 – 2014-11-09 (×4): 6 mg via INTRAVENOUS
  Filled 2014-11-08 (×4): qty 1

## 2014-11-08 MED ORDER — HYDROCODONE-ACETAMINOPHEN 5-325 MG PO TABS: 1.0000 | ORAL_TABLET | ORAL | Status: DC | PRN

## 2014-11-08 MED ORDER — MORPHINE SULFATE 2 MG/ML IJ SOLN: 1.0000 mg | INTRAMUSCULAR | Status: DC | PRN

## 2014-11-08 MED ORDER — ONDANSETRON HCL 4 MG/2ML IJ SOLN: 4.0000 mg | INTRAMUSCULAR | Status: DC | PRN

## 2014-11-08 MED ORDER — BISACODYL 10 MG RE SUPP: 10.0000 mg | Freq: Every day | RECTAL | Status: DC | PRN

## 2014-11-08 MED ORDER — HYDROCODONE-HOMATROPINE 5-1.5 MG/5ML PO SYRP: 5.0000 mL | ORAL_SOLUTION | Freq: Four times a day (QID) | ORAL | Status: DC | PRN

## 2014-11-08 MED ORDER — DEXAMETHASONE SODIUM PHOSPHATE 4 MG/ML IJ SOLN
4.0000 mg | Freq: Four times a day (QID) | INTRAMUSCULAR | Status: AC
  Administered 2014-11-09 – 2014-11-10 (×4): 4 mg via INTRAVENOUS
  Filled 2014-11-08 (×4): qty 1

## 2014-11-08 MED ORDER — SUCCINYLCHOLINE CHLORIDE 20 MG/ML IJ SOLN
INTRAMUSCULAR | Status: AC
  Filled 2014-11-08: qty 1

## 2014-11-08 MED ORDER — PANTOPRAZOLE SODIUM 40 MG IV SOLR
40.0000 mg | Freq: Every day | INTRAVENOUS | Status: DC
  Administered 2014-11-08: 40 mg via INTRAVENOUS
  Filled 2014-11-08: qty 40

## 2014-11-08 MED ORDER — NEOSTIGMINE METHYLSULFATE 10 MG/10ML IV SOLN
INTRAVENOUS | Status: DC | PRN
  Administered 2014-11-08: 4 mg via INTRAVENOUS

## 2014-11-08 MED ORDER — STERILE WATER FOR INJECTION IJ SOLN
INTRAMUSCULAR | Status: AC
  Filled 2014-11-08: qty 10

## 2014-11-08 MED ORDER — SODIUM CHLORIDE 0.9 % IV SOLN
INTRAVENOUS | Status: DC | PRN
  Administered 2014-11-08: 07:00:00 via INTRAVENOUS

## 2014-11-08 MED ORDER — THROMBIN 20000 UNITS EX SOLR
CUTANEOUS | Status: DC | PRN
  Administered 2014-11-08: 20 mL via TOPICAL

## 2014-11-08 MED ORDER — GLYCOPYRROLATE 0.2 MG/ML IJ SOLN
INTRAMUSCULAR | Status: DC | PRN
  Administered 2014-11-08: 0.6 mg via INTRAVENOUS

## 2014-11-08 MED ORDER — PROPOFOL 10 MG/ML IV BOLUS
INTRAVENOUS | Status: AC
  Filled 2014-11-08: qty 20

## 2014-11-08 MED ORDER — PROMETHAZINE HCL 25 MG PO TABS: 12.5000 mg | ORAL_TABLET | ORAL | Status: DC | PRN

## 2014-11-08 MED ORDER — LISINOPRIL 10 MG PO TABS
10.0000 mg | ORAL_TABLET | Freq: Every day | ORAL | Status: DC
  Administered 2014-11-09 – 2014-11-12 (×4): 10 mg via ORAL
  Filled 2014-11-08 (×5): qty 1

## 2014-11-08 MED ORDER — VITAMIN D 1000 UNITS PO TABS
2000.0000 [IU] | ORAL_TABLET | Freq: Every day | ORAL | Status: DC
  Administered 2014-11-09 – 2014-11-12 (×4): 2000 [IU] via ORAL
  Filled 2014-11-08 (×4): qty 2

## 2014-11-08 MED ORDER — DEXAMETHASONE SODIUM PHOSPHATE 10 MG/ML IJ SOLN
INTRAMUSCULAR | Status: AC
  Filled 2014-11-08: qty 1

## 2014-11-08 MED ORDER — ESMOLOL HCL 10 MG/ML IV SOLN
INTRAVENOUS | Status: DC | PRN
  Administered 2014-11-08 (×2): 10 mg via INTRAVENOUS

## 2014-11-08 MED ORDER — ONDANSETRON HCL 4 MG/2ML IJ SOLN
INTRAMUSCULAR | Status: DC | PRN
  Administered 2014-11-08: 4 mg via INTRAVENOUS

## 2014-11-08 MED ORDER — ACETAMINOPHEN 650 MG RE SUPP
650.0000 mg | RECTAL | Status: DC | PRN
Start: 2014-11-08 — End: 2014-11-12

## 2014-11-08 MED ORDER — ONDANSETRON HCL 4 MG PO TABS: 4.0000 mg | ORAL_TABLET | ORAL | Status: DC | PRN

## 2014-11-08 MED ORDER — SODIUM CHLORIDE 0.9 % IV SOLN: 0.0125 ug/kg/min | INTRAVENOUS | Status: DC

## 2014-11-08 MED ORDER — EPHEDRINE SULFATE 50 MG/ML IJ SOLN
INTRAMUSCULAR | Status: DC | PRN
  Administered 2014-11-08 (×2): 5 mg via INTRAVENOUS

## 2014-11-08 MED ORDER — ONDANSETRON HCL 4 MG/2ML IJ SOLN
INTRAMUSCULAR | Status: AC
  Filled 2014-11-08: qty 2

## 2014-11-08 MED ORDER — HYDROMORPHONE HCL 1 MG/ML IJ SOLN: 0.2500 mg | INTRAMUSCULAR | Status: DC | PRN

## 2014-11-08 MED ORDER — SENNA 8.6 MG PO TABS
1.0000 | ORAL_TABLET | Freq: Two times a day (BID) | ORAL | Status: DC
  Administered 2014-11-09 – 2014-11-12 (×6): 8.6 mg via ORAL
  Filled 2014-11-08 (×9): qty 1

## 2014-11-08 MED ORDER — PHENYLEPHRINE HCL 10 MG/ML IJ SOLN
10.0000 mg | INTRAMUSCULAR | Status: DC | PRN
  Administered 2014-11-08: 10 ug/min via INTRAVENOUS

## 2014-11-08 MED ORDER — FENTANYL CITRATE 0.05 MG/ML IJ SOLN
INTRAMUSCULAR | Status: DC | PRN
  Administered 2014-11-08 (×3): 50 ug via INTRAVENOUS

## 2014-11-08 MED ORDER — NEOSTIGMINE METHYLSULFATE 10 MG/10ML IV SOLN
INTRAVENOUS | Status: AC
  Filled 2014-11-08: qty 1

## 2014-11-08 MED ORDER — FENTANYL CITRATE 0.05 MG/ML IJ SOLN
INTRAMUSCULAR | Status: AC
  Filled 2014-11-08: qty 5

## 2014-11-08 MED ORDER — GLYCOPYRROLATE 0.2 MG/ML IJ SOLN
INTRAMUSCULAR | Status: AC
  Filled 2014-11-08: qty 3

## 2014-11-08 MED ORDER — ACETAMINOPHEN 325 MG PO TABS: 650.0000 mg | ORAL_TABLET | ORAL | Status: DC | PRN

## 2014-11-08 MED ORDER — POTASSIUM CHLORIDE IN NACL 20-0.9 MEQ/L-% IV SOLN
INTRAVENOUS | Status: DC
  Administered 2014-11-08 – 2014-11-10 (×4): via INTRAVENOUS
  Administered 2014-11-11: 1000 mL via INTRAVENOUS
  Administered 2014-11-12: 06:00:00 via INTRAVENOUS
  Filled 2014-11-08 (×8): qty 1000

## 2014-11-08 MED ORDER — DOCUSATE SODIUM 100 MG PO CAPS
100.0000 mg | ORAL_CAPSULE | Freq: Two times a day (BID) | ORAL | Status: DC
  Administered 2014-11-09 – 2014-11-12 (×6): 100 mg via ORAL
  Filled 2014-11-08 (×9): qty 1

## 2014-11-08 MED ORDER — DEXAMETHASONE SODIUM PHOSPHATE 10 MG/ML IJ SOLN
INTRAMUSCULAR | Status: DC | PRN
  Administered 2014-11-08: 10 mg via INTRAVENOUS

## 2014-11-08 MED ORDER — SODIUM CHLORIDE 0.9 % IV SOLN
0.0125 ug/kg/min | INTRAVENOUS | Status: AC
  Administered 2014-11-08: .1 ug/kg/min via INTRAVENOUS
  Filled 2014-11-08: qty 2000

## 2014-11-08 MED ORDER — DEXAMETHASONE 4 MG PO TABS: 4.0000 mg | ORAL_TABLET | Freq: Three times a day (TID) | ORAL | Status: DC

## 2014-11-08 MED ORDER — FLEET ENEMA 7-19 GM/118ML RE ENEM
1.0000 | ENEMA | Freq: Once | RECTAL | Status: AC | PRN
  Filled 2014-11-08: qty 1

## 2014-11-08 MED ORDER — NALOXONE HCL 0.4 MG/ML IJ SOLN
INTRAMUSCULAR | Status: DC | PRN
  Administered 2014-11-08: 40 ug via INTRAVENOUS

## 2014-11-08 MED ORDER — MICROFIBRILLAR COLL HEMOSTAT EX PADS
MEDICATED_PAD | CUTANEOUS | Status: DC | PRN
  Administered 2014-11-08: 1 via TOPICAL

## 2014-11-08 MED ORDER — ROCURONIUM BROMIDE 50 MG/5ML IV SOLN
INTRAVENOUS | Status: AC
  Filled 2014-11-08: qty 1

## 2014-11-08 MED ORDER — ARTIFICIAL TEARS OP OINT
TOPICAL_OINTMENT | OPHTHALMIC | Status: DC | PRN
  Administered 2014-11-08: 1 via OPHTHALMIC

## 2014-11-08 MED ORDER — LIDOCAINE-EPINEPHRINE 1 %-1:100000 IJ SOLN
INTRAMUSCULAR | Status: DC | PRN
  Administered 2014-11-08: 5.5 mL

## 2014-11-08 MED ORDER — LEVETIRACETAM IN NACL 500 MG/100ML IV SOLN
500.0000 mg | INTRAVENOUS | Status: AC
  Administered 2014-11-08: 500 mg via INTRAVENOUS
  Filled 2014-11-08: qty 100

## 2014-11-08 MED ORDER — PROPOFOL 10 MG/ML IV BOLUS
INTRAVENOUS | Status: DC | PRN
  Administered 2014-11-08 (×3): 50 mg via INTRAVENOUS
  Administered 2014-11-08: 80 mg via INTRAVENOUS

## 2014-11-08 MED ORDER — LABETALOL HCL 5 MG/ML IV SOLN: 10.0000 mg | INTRAVENOUS | Status: DC | PRN

## 2014-11-08 MED ORDER — DEXAMETHASONE SODIUM PHOSPHATE 4 MG/ML IJ SOLN
4.0000 mg | Freq: Three times a day (TID) | INTRAMUSCULAR | Status: DC
  Administered 2014-11-10 – 2014-11-11 (×3): 4 mg via INTRAVENOUS
  Filled 2014-11-08 (×5): qty 1

## 2014-11-08 MED ORDER — BUPIVACAINE HCL (PF) 0.5 % IJ SOLN
INTRAMUSCULAR | Status: DC | PRN
  Administered 2014-11-08: 5.5 mL

## 2014-11-08 MED ORDER — SENNOSIDES-DOCUSATE SODIUM 8.6-50 MG PO TABS
1.0000 | ORAL_TABLET | Freq: Every evening | ORAL | Status: DC | PRN
  Filled 2014-11-08: qty 1

## 2014-11-08 MED ORDER — VECURONIUM BROMIDE 10 MG IV SOLR
INTRAVENOUS | Status: AC
  Filled 2014-11-08: qty 10

## 2014-11-08 MED ORDER — LEVETIRACETAM IN NACL 500 MG/100ML IV SOLN
500.0000 mg | Freq: Two times a day (BID) | INTRAVENOUS | Status: DC
  Administered 2014-11-08 – 2014-11-10 (×5): 500 mg via INTRAVENOUS
  Filled 2014-11-08 (×6): qty 100

## 2014-11-08 SURGICAL SUPPLY — 83 items
BANDAGE GAUZE 4  KLING STR (GAUZE/BANDAGES/DRESSINGS) ×4 IMPLANT
BIT DRILL WIRE PASS 1.3MM (BIT) IMPLANT
BLADE CLIPPER SURG (BLADE) ×2 IMPLANT
BRUSH SCRUB EZ 1% IODOPHOR (MISCELLANEOUS) ×2 IMPLANT
BRUSH SCRUB EZ PLAIN DRY (MISCELLANEOUS) ×2 IMPLANT
BUR ACORN 6.0 PRECISION (BURR) ×2 IMPLANT
BUR ADDG 1.1 (BURR) IMPLANT
BUR ROUTER D-58 CRANI (BURR) ×2 IMPLANT
CANISTER SUCT 3000ML (MISCELLANEOUS) ×2 IMPLANT
CLIP TI MEDIUM 6 (CLIP) IMPLANT
CONT SPEC 4OZ CLIKSEAL STRL BL (MISCELLANEOUS) ×4 IMPLANT
COVER MAYO STAND STRL (DRAPES) IMPLANT
DECANTER SPIKE VIAL GLASS SM (MISCELLANEOUS) ×2 IMPLANT
DRAIN SNY WOU 7FLT (WOUND CARE) IMPLANT
DRAPE MICROSCOPE LEICA (MISCELLANEOUS) IMPLANT
DRAPE NEUROLOGICAL W/INCISE (DRAPES) ×2 IMPLANT
DRAPE STERI IOBAN 125X83 (DRAPES) IMPLANT
DRAPE WARM FLUID 44X44 (DRAPE) ×2 IMPLANT
DRILL WIRE PASS 1.3MM (BIT)
DRSG OPSITE 4X5.5 SM (GAUZE/BANDAGES/DRESSINGS) ×4 IMPLANT
DRSG TELFA 3X8 NADH (GAUZE/BANDAGES/DRESSINGS) ×2 IMPLANT
DURAMATRIX ONLAY 3X3 (Plate) ×2 IMPLANT
DURAPREP 6ML APPLICATOR 50/CS (WOUND CARE) ×2 IMPLANT
ELECT REM PT RETURN 9FT ADLT (ELECTROSURGICAL) ×2
ELECTRODE REM PT RTRN 9FT ADLT (ELECTROSURGICAL) ×1 IMPLANT
EVACUATOR 1/8 PVC DRAIN (DRAIN) IMPLANT
EVACUATOR SILICONE 100CC (DRAIN) IMPLANT
FORCEPS BIPOLAR SPETZLER 8 1.0 (NEUROSURGERY SUPPLIES) ×2 IMPLANT
GAUZE SPONGE 4X4 12PLY STRL (GAUZE/BANDAGES/DRESSINGS) ×2 IMPLANT
GAUZE SPONGE 4X4 16PLY XRAY LF (GAUZE/BANDAGES/DRESSINGS) IMPLANT
GLOVE BIO SURGEON STRL SZ8 (GLOVE) ×4 IMPLANT
GLOVE BIOGEL PI IND STRL 7.5 (GLOVE) ×1 IMPLANT
GLOVE BIOGEL PI IND STRL 8 (GLOVE) ×1 IMPLANT
GLOVE BIOGEL PI IND STRL 8.5 (GLOVE) ×1 IMPLANT
GLOVE BIOGEL PI INDICATOR 7.5 (GLOVE) ×1
GLOVE BIOGEL PI INDICATOR 8 (GLOVE) ×1
GLOVE BIOGEL PI INDICATOR 8.5 (GLOVE) ×1
GLOVE ECLIPSE 8.0 STRL XLNG CF (GLOVE) ×2 IMPLANT
GLOVE EXAM NITRILE LRG STRL (GLOVE) IMPLANT
GLOVE EXAM NITRILE MD LF STRL (GLOVE) IMPLANT
GLOVE EXAM NITRILE XL STR (GLOVE) IMPLANT
GLOVE EXAM NITRILE XS STR PU (GLOVE) IMPLANT
GOWN STRL REUS W/ TWL LRG LVL3 (GOWN DISPOSABLE) ×1 IMPLANT
GOWN STRL REUS W/ TWL XL LVL3 (GOWN DISPOSABLE) ×1 IMPLANT
GOWN STRL REUS W/TWL 2XL LVL3 (GOWN DISPOSABLE) ×2 IMPLANT
GOWN STRL REUS W/TWL LRG LVL3 (GOWN DISPOSABLE) ×2
GOWN STRL REUS W/TWL XL LVL3 (GOWN DISPOSABLE) ×1
HEMOSTAT POWDER KIT SURGIFOAM (HEMOSTASIS) ×2 IMPLANT
HEMOSTAT SURGICEL 2X14 (HEMOSTASIS) ×2 IMPLANT
KIT BASIN OR (CUSTOM PROCEDURE TRAY) ×2 IMPLANT
KIT ROOM TURNOVER OR (KITS) ×2 IMPLANT
MARKER SKIN DUAL TIP RULER LAB (MISCELLANEOUS) ×4 IMPLANT
NEEDLE HYPO 25X1 1.5 SAFETY (NEEDLE) ×2 IMPLANT
NS IRRIG 1000ML POUR BTL (IV SOLUTION) ×4 IMPLANT
PACK CRANIOTOMY (CUSTOM PROCEDURE TRAY) ×2 IMPLANT
PAD ARMBOARD 7.5X6 YLW CONV (MISCELLANEOUS) ×2 IMPLANT
PAD EYE OVAL STERILE LF (GAUZE/BANDAGES/DRESSINGS) IMPLANT
PATTIES SURGICAL .25X.25 (GAUZE/BANDAGES/DRESSINGS) IMPLANT
PATTIES SURGICAL .5 X.5 (GAUZE/BANDAGES/DRESSINGS) IMPLANT
PATTIES SURGICAL .5 X3 (DISPOSABLE) IMPLANT
PATTIES SURGICAL 1/4 X 3 (GAUZE/BANDAGES/DRESSINGS) IMPLANT
PATTIES SURGICAL 1X1 (DISPOSABLE) IMPLANT
PIN MAYFIELD SKULL DISP (PIN) ×2 IMPLANT
PLATE 1.5/0.5 13MM BURR HOLE (Plate) ×2 IMPLANT
PLATE 1.5/0.5 18.5MM BURR HOLE (Plate) ×4 IMPLANT
RUBBERBAND STERILE (MISCELLANEOUS) IMPLANT
SCREW SELF DRILL HT 1.5/4MM (Screw) ×24 IMPLANT
SPECIMEN JAR SMALL (MISCELLANEOUS) IMPLANT
SPONGE NEURO XRAY DETECT 1X3 (DISPOSABLE) IMPLANT
SPONGE SURGIFOAM ABS GEL 100 (HEMOSTASIS) ×2 IMPLANT
STAPLER SKIN PROX WIDE 3.9 (STAPLE) ×2 IMPLANT
SUT ETHILON 3 0 FSL (SUTURE) IMPLANT
SUT NURALON 4 0 TR CR/8 (SUTURE) ×6 IMPLANT
SUT SILK 2 0 FS (SUTURE) IMPLANT
SUT VIC AB 2-0 CP2 18 (SUTURE) ×4 IMPLANT
SYR CONTROL 10ML LL (SYRINGE) ×2 IMPLANT
TIP SONASTAR STD MISONIX 1.9 (TRAY / TRAY PROCEDURE) IMPLANT
TOWEL OR 17X24 6PK STRL BLUE (TOWEL DISPOSABLE) ×2 IMPLANT
TOWEL OR 17X26 10 PK STRL BLUE (TOWEL DISPOSABLE) ×2 IMPLANT
TRAY FOLEY CATH 14FRSI W/METER (CATHETERS) ×2 IMPLANT
TUBE CONNECTING 12X1/4 (SUCTIONS) ×2 IMPLANT
UNDERPAD 30X30 INCONTINENT (UNDERPADS AND DIAPERS) ×2 IMPLANT
WATER STERILE IRR 1000ML POUR (IV SOLUTION) ×2 IMPLANT

## 2014-11-08 NOTE — Anesthesia Preprocedure Evaluation (Addendum)
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 on medications     Neuro/Psych PSYCHIATRIC DISORDERS Depression    GI/Hepatic GERD-  ,  Endo/Other    Renal/GU      Musculoskeletal   Abdominal   Peds  Hematology   Anesthesia Other Findings   Reproductive/Obstetrics                            Anesthesia Physical Anesthesia Plan  ASA: III  Anesthesia Plan: General   Post-op Pain Management:    Induction: Intravenous  Airway Management Planned: Oral ETT  Additional Equipment: Arterial line and CVP  Intra-op Plan:   Post-operative Plan: Extubation in OR  Informed Consent: I have reviewed the patients History and Physical, chart, labs and discussed the procedure including the risks, benefits and alternatives for the proposed anesthesia with the patient or authorized representative who has indicated his/her understanding and acceptance.   Dental advisory given  Plan Discussed with: CRNA  Anesthesia Plan Comments:         Anesthesia Quick Evaluation

## 2014-11-08 NOTE — Progress Notes (Signed)
ANTIBIOTIC CONSULT NOTE - INITIAL  Pharmacy Consult for vancomycin Indication: surgical prophylaxis  Allergies  Allergen Reactions  . Penicillins Swelling    Patient Measurements: Height: _0  (154.9 cm) Weight: 131 lb 13.4 oz (59.8 kg) IBW/kg (Calculated) : 47.8  Vital Signs: Temp: 97.4 F (36.3 C) (01/22 1130) Temp Source: Oral (01/22 1130) BP: 144/54 mmHg (01/22 1130) Pulse Rate: 59 (01/22 1130) Intake/Output from this shift: Total I/O In: 1000 [I.V.:1000] Out: 725 [Urine:625; Blood:100]  Labs: No results for input(s): WBC, HGB, PLT, LABCREA, CREATININE in the last 72 hours. Estimated Creatinine Clearance: 41.6 mL/min (by C-G formula based on Cr of 0.97). No results for input(s): VANCOTROUGH, VANCOPEAK, VANCORANDOM, GENTTROUGH, GENTPEAK, GENTRANDOM, TOBRATROUGH, TOBRAPEAK, TOBRARND, AMIKACINPEAK, AMIKACINTROU, AMIKACIN in the last 72 hours.   Microbiology: Recent Results (from the past 720 hour(s))  Surgical pcr screen     Status: None   Collection Time: 11/05/14 11:07 AM  Result Value Ref Range Status   MRSA, PCR NEGATIVE NEGATIVE Final   Staphylococcus aureus NEGATIVE NEGATIVE Final    Comment:        The Xpert SA Assay (FDA approved for NASAL specimens in patients over 73 years of age), is one component of a comprehensive surveillance program.  Test performance has been validated by [facility] for patients greater than or equal to 45 year old. It is not intended to diagnose infection nor to guide or monitor treatment.     Medical History: Past Medical History  Diagnosis Date  . Nodule of left lung   . Hypertension   . Nicotine addiction   . Osteopenia   . Osteomalacia   . Impaired fasting glucose   . Hypercholesteremia   . Depression   . Lung cancer dx'd 02/2013  . 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 COPD (chronic obstructive pulmonary disease)   . GERD (gastroesophageal reflux disease)      Medications:  Anti-infectives    Start     Dose/Rate Route Frequency Ordered Stop   11/08/14 0600  vancomycin (VANCOCIN) IVPB 1000 mg/200 mL premix     1,000 mg200 mL/hr over 60 Minutes Intravenous On call to O.R. 11/07/14 1416 11/08/14 0835     Assessment: 74 year old female status post craniotomy tumor excision to receive vancomycin for surgical prophylaxis for 24 hours post operatively.  Preoperative vancomycin 1 gram given on 11/08/14 at 07:35 AM. AET 1009. Afebrile. CrCl 41.6 mL/min. Wt 59.8 kg.   Goal of Therapy:  Vancomycin trough level 10-15 mcg/ml  Plan:  Vancomycin 1 gram IV x1 at 19:35PM.  Patient will covered for 24 hours.  Pharmacy will sign off. Please re-consult if needed.   Sloan Leiter, PharmD, BCPS Clinical Pharmacist [phone]  11/08/2014,12:20 PM

## 2014-11-08 NOTE — Transfer of Care (Signed)
Immediate Anesthesia Transfer of Care Note  Patient: Chelsea Cruz  Procedure(s) Performed: Procedure(s) with comments: CRANIOTOMY TUMOR EXCISION (N/A) - CRANIOTOMY TUMOR EXCISION  Patient Location: PACU  Anesthesia Type:General  Level of Consciousness: awake, alert  and oriented  Airway & Oxygen Therapy: Patient Spontanous Breathing and Patient connected to nasal cannula oxygen  Post-op Assessment: Report given to PACU RN, Post -op Vital signs reviewed and stable and Patient moving all extremities X 4  Post vital signs: Reviewed and stable  Complications: No apparent anesthesia complications

## 2014-11-08 NOTE — Anesthesia Procedure Notes (Signed)
Procedures

## 2014-11-08 NOTE — Brief Op Note (Signed)
11/08/2014  9:37 AM  PATIENT:  Chelsea Cruz  74 y.o. female  PRE-OPERATIVE DIAGNOSIS:  Lung cancer, Brain metastasis, aphasia  POST-OPERATIVE DIAGNOSIS:  Lung cancer, Brain metastasis, aphasia  PROCEDURE:  Procedure(s) with comments: CRANIOTOMY TUMOR EXCISION (N/A) - CRANIOTOMY TUMOR EXCISION with Brain Lab navigation  SURGEON:  Surgeon(s) and Role:    * Erline Levine, MD - Primary    * Charlie Pitter, MD - Assisting  PHYSICIAN ASSISTANT:   ASSISTANTS: Poteat, RN   ANESTHESIA:   general  EBL:  Total I/O In: 1000 [I.V.:1000] Out: 525 [Urine:425; Blood:100]  BLOOD ADMINISTERED:none  DRAINS: none   LOCAL MEDICATIONS USED:  LIDOCAINE   SPECIMEN:  Excision  DISPOSITION OF SPECIMEN:  PATHOLOGY  COUNTS:  YES  TOURNIQUET:  * No tourniquets in log *  DICTATION: DICTATION: Patient is 74 year old woman with lung cancer. She has developed aphasia and has a large solitary left frontal brain metastasis.  It was elected to take her to surgery for craniotomy for left frontal brain tumor after preoperative stereotactic radiosurgery.  She had a Brain Lab MRI for surgical localization of tumor.  Procedure:  Following smooth intubation, patient was placed in left semi-lateral position with blanket roll.  Head was placed in pins and left frontal scalp was shaved and prepped and draped in usual sterile fashion after Brain Lab MRI was localized to map tumor location.  Area of planned incision was infiltrated with lidocaine. A curvilinear pre-auricular incision was made and carried through temporalis fascia and muscle to expose calvarium.  Skull flap was elevated exposing the dura directly overlying the brain mass.  Dura was opened.  A corticotomy was created in the frontal lobe and carried to remove the brain metastasis.  The neuro navigation was used to confirm extent of tumor resection.  The tumor was fibrous and appeared encapsulated.  The surrounding brain appeared edematous, but did not appear  to be cancerous.  I elected not to remove this edematous portion because of the patient's preoperative aphasia and the close proximity of this mass to Broca's Area and conductive speech fibers. Hemostasis was assured with irrigation and cotton balls.  Hemostasis was assured and the tumor cavity was lined with Surgifoam. A Dura Matrix graft was placed. The bone flap was replaced with plates, the fascia and galea were closed with 2-0 vicryl sutures and the skin was re approximated with staples.  A sterile occlusive dressing was placed.  Patient was returned to a supine position and taken out of head pins, then extubated in the operating room, having tolerated surgery well.  Counts were correct at the end of the case.  PLAN OF CARE: Admit to inpatient   PATIENT DISPOSITION:  PACU - hemodynamically stable.   Delay start of Pharmacological VTE agent (>24hrs) due to surgical blood loss or risk of bleeding: yes

## 2014-11-08 NOTE — Interval H&P Note (Signed)
History and Physical Interval Note:  11/08/2014 7:26 AM  Chelsea Cruz  has presented today for surgery, with the diagnosis of Lung cancer, Brain tumor  The various methods of treatment have been discussed with the patient and family. After consideration of risks, benefits and other options for treatment, the patient has consented to  Procedure(s) with comments: CRANIOTOMY TUMOR EXCISION (N/A) - CRANIOTOMY TUMOR EXCISION as a surgical intervention .  The patient's history has been reviewed, patient examined, no change in status, stable for surgery.  I have reviewed the patient's chart and labs.  Questions were answered to the patient's satisfaction.     Lillie Bollig D

## 2014-11-08 NOTE — Addendum Note (Signed)
Addendum  created 11/08/14 1217 by Carola Frost, CRNA   Modules edited: Anesthesia LDA, Lines/Drains/Airways Properties Editor   Lines/Drains/Airways Properties Editor:  Properties of line/drain/airway/wound Airway 7 mm have been modified.

## 2014-11-08 NOTE — Anesthesia Postprocedure Evaluation (Signed)
  Anesthesia Post-op Note  Patient: Chelsea Cruz  Procedure(s) Performed: Procedure(s) with comments: CRANIOTOMY TUMOR EXCISION (N/A) - CRANIOTOMY TUMOR EXCISION  Patient Location: PACU  Anesthesia Type:General  Level of Consciousness: awake and alert   Airway and Oxygen Therapy: Patient Spontanous Breathing  Post-op Pain: none  Post-op Assessment: Post-op Vital signs reviewed  Post-op Vital Signs: Reviewed  Last Vitals:  Filed Vitals:   11/08/14 1130  BP: 144/54  Pulse: 59  Temp: 36.3 C  Resp: 19    Complications: No apparent anesthesia complications

## 2014-11-08 NOTE — Progress Notes (Signed)
Awake, alert, conversant.  States name.  MAE to command.  Doing well.

## 2014-11-08 NOTE — Op Note (Signed)
11/08/2014  9:37 AM  PATIENT:  Chelsea Cruz  74 y.o. female  PRE-OPERATIVE DIAGNOSIS:  Lung cancer, Brain metastasis, aphasia  POST-OPERATIVE DIAGNOSIS:  Lung cancer, Brain metastasis, aphasia  PROCEDURE:  Procedure(s) with comments: CRANIOTOMY TUMOR EXCISION (N/A) - CRANIOTOMY TUMOR EXCISION with Brain Lab navigation  SURGEON:  Surgeon(s) and Role:    * Erline Levine, MD - Primary    * Charlie Pitter, MD - Assisting  PHYSICIAN ASSISTANT:   ASSISTANTS: Poteat, RN   ANESTHESIA:   general  EBL:  Total I/O In: 1000 [I.V.:1000] Out: 525 [Urine:425; Blood:100]  BLOOD ADMINISTERED:none  DRAINS: none   LOCAL MEDICATIONS USED:  LIDOCAINE   SPECIMEN:  Excision  DISPOSITION OF SPECIMEN:  PATHOLOGY  COUNTS:  YES  TOURNIQUET:  * No tourniquets in log *  DICTATION: DICTATION: Patient is 74 year old woman with lung cancer. She has developed aphasia and has a large solitary left frontal brain metastasis.  It was elected to take her to surgery for craniotomy for left frontal brain tumor after preoperative stereotactic radiosurgery.  She had a Brain Lab MRI for surgical localization of tumor.  Procedure:  Following smooth intubation, patient was placed in left semi-lateral position with blanket roll.  Head was placed in pins and left frontal scalp was shaved and prepped and draped in usual sterile fashion after Brain Lab MRI was localized to map tumor location.  Area of planned incision was infiltrated with lidocaine. A curvilinear pre-auricular incision was made and carried through temporalis fascia and muscle to expose calvarium.  Skull flap was elevated exposing the dura directly overlying the brain mass.  Dura was opened.  A corticotomy was created in the frontal lobe and carried to remove the brain metastasis.  The neuro navigation was used to confirm extent of tumor resection.  The tumor was fibrous and appeared encapsulated.  The surrounding brain appeared edematous, but did not appear  to be cancerous.  I elected not to remove this edematous portion because of the patient's preoperative aphasia and the close proximity of this mass to Broca's Area and conductive speech fibers. Hemostasis was assured with irrigation and cotton balls.  Hemostasis was assured and the tumor cavity was lined with Surgifoam. A Dura Matrix graft was placed. The bone flap was replaced with plates, the fascia and galea were closed with 2-0 vicryl sutures and the skin was re approximated with staples.  A sterile occlusive dressing was placed.  Patient was returned to a supine position and taken out of head pins, then extubated in the operating room, having tolerated surgery well.  Counts were correct at the end of the case.  PLAN OF CARE: Admit to inpatient   PATIENT DISPOSITION:  PACU - hemodynamically stable.   Delay start of Pharmacological VTE agent (>24hrs) due to surgical blood loss or risk of bleeding: yes

## 2014-11-09 ENCOUNTER — Inpatient Hospital Stay (HOSPITAL_COMMUNITY): Payer: Medicare Other

## 2014-11-09 MED ORDER — PANTOPRAZOLE SODIUM 40 MG PO TBEC
40.0000 mg | DELAYED_RELEASE_TABLET | Freq: Every day | ORAL | Status: DC
  Administered 2014-11-09 – 2014-11-11 (×3): 40 mg via ORAL
  Filled 2014-11-09 (×3): qty 1

## 2014-11-09 MED ORDER — GADOBENATE DIMEGLUMINE 529 MG/ML IV SOLN
10.0000 mL | Freq: Once | INTRAVENOUS | Status: AC | PRN
  Administered 2014-11-09: 10 mL via INTRAVENOUS

## 2014-11-09 NOTE — Plan of Care (Signed)
Problem: Consults Goal: Diagnosis - Craniotomy Outcome: Completed/Met Date Met:  11/09/14 Tumor removal

## 2014-11-09 NOTE — Progress Notes (Signed)
Postop day 1. No problems or new developments overnight. Patient denies headache.  Afebrile. Vitals are stable. Urine output good.  Patient awake and aware. 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 Follows commands with both upper and lower extremities. No evidence of significant hemiparesis. Wound clean and dry. Chest and abdomen benign.  Status post left frontal metastatic tumor resection. Continue IV steroids and supportive efforts. Follow-up MRI scan today.

## 2014-11-09 NOTE — Progress Notes (Signed)
  Radiation Oncology         [phone] ________________________________  Name: Chelsea Cruz MRN: [id number]  Date: 11/06/2014  DOB: [birth year]  End of Treatment Note   ICD-10  Solitary Left Frontal 3.4 cm Brain Metastasis C79.31    DIAGNOSIS: 74 year old woman with a solitary 3.4 cm left frontal brain metastasis from squamous cell carcinoma of the left upper lung    Indication for treatment:  Palliation       Radiation treatment dates:   11/06/2014  Site/dose/beams/energy:   Chelsea Cruz received stereotactic radiosurgery to the her left frontal 3.4 cm target was treated using 4 Dynamic Conformal Arcs to a prescription dose of 15 Gy. ExacTrac registration was performed for each couch angle. The 84.7% isodose line was prescribed. 6 MV X-rays were delivered in the flattening filter free beam mode.  Narrative: The patient tolerated radiation treatment relatively well.   No acute complications occurred.  Plan: The patient has completed radiation treatment. The patient will return to radiation oncology clinic for routine followup in one month. I advised them to call or return sooner if they have any questions or concerns related to their recovery or treatment. ________________________________  Sheral Apley. Tammi Klippel, M.D.

## 2014-11-10 MED ORDER — LEVETIRACETAM 500 MG PO TABS
500.0000 mg | ORAL_TABLET | Freq: Two times a day (BID) | ORAL | Status: DC
  Administered 2014-11-10 – 2014-11-12 (×4): 500 mg via ORAL
  Filled 2014-11-10 (×4): qty 1

## 2014-11-10 NOTE — Progress Notes (Signed)
Postop day 2. Patient brighter and more spontaneous today. Speaking a few more words but still with a rather dense nonfluent aphasia. Wound clean and dry. Denies headache.  Afebrile. Vitals are stable. Awake and alert. Minimally verbal. Cranial nerve function intact. Motor 5/5 bilaterally.  Follow-up MRI scan demonstrates good appearance of her surgical resection. There is quite a bit of edema around the resection site. The radiologist has labeled this as possible infarction which I doubt.  Overall progressing reasonably well. Continue steroids and continue efforts at mobilization. Okay to transfer to floor.

## 2014-11-11 ENCOUNTER — Encounter (HOSPITAL_COMMUNITY): Payer: Self-pay | Admitting: Neurosurgery

## 2014-11-11 MED ORDER — DEXAMETHASONE SODIUM PHOSPHATE 4 MG/ML IJ SOLN
4.0000 mg | Freq: Four times a day (QID) | INTRAMUSCULAR | Status: DC
  Administered 2014-11-11 – 2014-11-12 (×5): 4 mg via INTRAVENOUS
  Filled 2014-11-11 (×5): qty 1

## 2014-11-11 NOTE — Progress Notes (Signed)
Subjective: Patient reports aphasic.  Able to understand, but has difficulty initiating speech.  Objective: Vital signs in last 24 hours: Temp:  [97.5 F (36.4 C)-98.7 F (37.1 C)] 97.8 F (36.6 C) (01/25 0635) Pulse Rate:  [58-78] 64 (01/25 0635) Resp:  [15-20] 16 (01/25 0635) BP: (101-146)/(42-80) 126/52 mmHg (01/25 0635) SpO2:  [93 %-98 %] 97 % (01/25 0635)  Intake/Output from previous day: 01/24 0701 - 01/25 0700 In: 1065 [P.O.:240; I.V.:825] Out: 275 [Urine:275] Intake/Output this shift:    Physical Exam: PERRL, EOMI.  MAEW without drift.  Initiates some speech, but has difficulty naming or repeating. Wound CDI.  Lab Results: No results for input(s): WBC, HGB, HCT, PLT in the last 72 hours. BMET No results for input(s): NA, K, CL, CO2, GLUCOSE, BUN, CREATININE, CALCIUM in the last 72 hours.  Studies/Results: Mr Kizzie Fantasia Contrast  11/09/2014   CLINICAL DATA:  Postop tumor resection, day 1. Metastatic disease. Lung cancer.  EXAM: MRI HEAD WITHOUT AND WITH CONTRAST  TECHNIQUE: Multiplanar, multiecho pulse sequences of the brain and surrounding structures were obtained without and with intravenous contrast.  CONTRAST:  8mL MULTIHANCE GADOBENATE DIMEGLUMINE 529 MG/ML IV SOLN  COMPARISON:  MRI 10/28/2014  FINDINGS: Postop left pterional craniotomy for tumor resection in the left frontal lobe. Small amount of pneumocephalus is present. Post surgical cavity is present containing fluid and blood products. There is a small amount of blood in the sylvian fissure. Following contrast infusion, there is mild patchy enhancement along the margins of the surgical cavity. Majority of the enhancement is medial to the surgical cavity as well as some mild enhancement anterior to the surgical cavity. These areas are suspicious for residual tumor given recent resection yesterday. Moderate white matter edema in the left frontal lobe is unchanged. Mild midline shift to the right measures 4 mm slightly  increased from the preoperative study.  Diffusion-weighted imaging reveals restricted diffusion around the surgical site. This is most prominent superior to the surgical resection site but also extends to the insula and inferior to the resection site. This is compatible with acute perioperative infarction.  No other enhancing lesions are seen following contrast infusion.  IMPRESSION: 24 hr postop resection left frontal metastatic deposit. Moderately large perioperative infarct is noted. There is blood in the surgical cavity and sylvian fissure on the left.  Patchy areas of enhancement along the margin of the surgical cavity specially medially consistent with residual tumor. Followup imaging recommended.   Electronically Signed   By: Franchot Gallo M.D.   On: 11/09/2014 14:54    Assessment/Plan: Patient has expressive aphasia.  Tumor removed based on post-operative scan.  I agree with Dr. Annette Stable that I do not believe patient has had an infarct, but has peri-resectional edema.  She will need continued steroids for edema and speech therapy.  I will initiate this today and continued speech therapy as an outpatient.    LOS: 3 days    Peggyann Shoals, MD 11/11/2014, 8:13 AM

## 2014-11-11 NOTE — Care Management Note (Signed)
    Page 1 of 2   11/12/2014     3:20:03 PM CARE MANAGEMENT NOTE 11/12/2014  Patient:  Chelsea Cruz, Chelsea Cruz   Account Number:  [id number]  Date Initiated:  11/11/2014  Documentation initiated by:  Lorne Skeens  Subjective/Objective Assessment:   Patient was admitted with lung CA, brain tumor. Left frontal crainiotomy.     Action/Plan:   Will follow for discharge needs pending PT/OT evals and physician orders.   Anticipated DC Date:  11/12/2014   Anticipated DC Plan:  [place]  CM consult      Choice offered to / List presented to:  C-1 Patient        [place] arranged  [place]   Status of service:   Medicare Important Message given?  YES (If response is "NO", the following Medicare IM given date fields will be blank) Date Medicare IM given:  11/11/2014 Medicare IM given by:  Lorne Skeens Date Additional Medicare IM given:   Additional Medicare IM given by:    Discharge Disposition:  [place]  Per UR Regulation:  Reviewed for med. necessity/level of care/duration of stay  If discussed at [place] of Stay Meetings, dates discussed:    Comments:  11/12/14 [place], MSN, CM- Spoke with Stanton Kidney with [facility], who has accepted the referral.     11/12/14 [place] RN, MSN,CM- Met with patient to discuss home health needs. patient has chosen [place]. Voicemail was left for [facility] with [facility], awaiting return call.  Patient will be discharging this evening to her daughter Darrol Poke home: [address] [place], [place] [address]  Please use daughter Penny's cell for appointments [phone]

## 2014-11-11 NOTE — Progress Notes (Signed)
UR complete.  Eudell Mcphee RN, MSN 

## 2014-11-12 MED ORDER — DEXAMETHASONE 4 MG PO TABS: 4.0000 mg | ORAL_TABLET | Freq: Three times a day (TID) | ORAL | Status: DC

## 2014-11-12 MED ORDER — HYDROCODONE-ACETAMINOPHEN 5-325 MG PO TABS: 1.0000 | ORAL_TABLET | ORAL | Status: DC | PRN

## 2014-11-12 MED ORDER — LEVETIRACETAM 500 MG PO TABS: 500.0000 mg | ORAL_TABLET | Freq: Two times a day (BID) | ORAL | Status: DC

## 2014-11-12 MED ORDER — PANTOPRAZOLE SODIUM 40 MG PO TBEC: 40.0000 mg | DELAYED_RELEASE_TABLET | Freq: Every day | ORAL | Status: DC

## 2014-11-12 NOTE — Progress Notes (Signed)
Reviewed discharge instructions with patient and daughter, Kieth Brightly. 4 Rx given, follow up appt with Dr. Vertell Limber made for staple removal. Reviewed follow up appts for oncology/radiation. Pt was transported to front lobby via wheelchair and transported to daughter's home. Home Health to follow up. Discharged at 1612.

## 2014-11-12 NOTE — Discharge Summary (Signed)
Physician Discharge Summary  Patient ID: Chelsea Cruz MRN: [id number] DOB/AGE: 1940/12/19 74 y.o.  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  She had expressive aphasia which substantially improved at time of discharge.  Postoperative MRI demonstrated gross total resection of brain metastasis.  Consults: None  Significant Diagnostic Studies: radiology: MRI: Postoperative MRI demonstrated gross total resection of brain metastasis  Treatments: surgery: preoperative Stereotactic Radiosurgery to solitary brain metastasis followed by craniotomy with tumor resection with image guidance  Discharge Exam: Blood pressure 117/53, pulse 72, temperature 97.5 F (36.4 C), temperature source Oral, resp. rate 16, height 5\' 1"  (1.549 m), weight 59.2 kg (130 lb 8.2 oz), SpO2 98 %. Neurologic: Alert and oriented X 3, normal strength and tone. Normal symmetric reflexes. Normal coordination and gait Wound:CDI  Disposition: Home     Medication List    TAKE these medications        aspirin 325 MG EC tablet  Take 325 mg by mouth daily as needed for pain.     cholecalciferol 1000 UNITS tablet  Commonly known as:  VITAMIN D  Take 2,000 Units by mouth daily. Takes 3 capsules     dexamethasone 4 MG tablet  Commonly known as:  DECADRON  Take 1 tablet (4 mg total) by mouth 3 (three) times daily.     HYDROcodone-acetaminophen 5-325 MG per tablet  Commonly known as:  NORCO/VICODIN  Take 1 tablet by mouth every 4 (four) hours as needed for moderate pain.     HYDROcodone-homatropine 5-1.5 MG/5ML syrup  Commonly known as:  HYCODAN  Take 5 mLs by mouth every 6  (six) hours as needed for cough.     levETIRAcetam 500 MG tablet  Commonly known as:  KEPPRA  Take 1 tablet (500 mg total) by mouth 2 (two) times daily.     lisinopril 10 MG tablet  Commonly known as:  PRINIVIL,ZESTRIL  Take 10 mg by mouth daily.     pantoprazole 40 MG tablet  Commonly known as:  PROTONIX  Take 1 tablet (40 mg total) by mouth at bedtime.         Signed: Peggyann Shoals, MD 11/12/2014, 1:01 PM

## 2014-11-12 NOTE — Evaluation (Signed)
Speech Language Pathology Evaluation Patient Details Name: Chelsea Cruz MRN: [id number] DOB: 06/03/1941 Today's Date: 11/12/2014 Time: 0528-1885 SLP Time Calculation (min) (ACUTE ONLY): 47 min  Problem List:  Patient Active Problem List   Diagnosis Date Noted  . Metastasis to brain 11/08/2014  . 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 Hypertension   . Osteopenia   . Osteomalacia   . Impaired fasting glucose   . Primary cancer of left lower lobe of lung   . Hypercholesteremia    Past Medical History:  Past Medical History  Diagnosis Date  . Nodule of left lung   . Hypertension   . Nicotine addiction   . Osteopenia   . Osteomalacia   . Impaired fasting glucose   . Hypercholesteremia   . Depression   . Lung cancer dx'd 02/2013  . 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 COPD (chronic obstructive pulmonary disease)   . GERD (gastroesophageal reflux disease)    Past Surgical History:  Past Surgical History  Procedure Laterality Date  . Cholecystectomy    . Right oophorectomy      1969  . Cataract extraction w/ intraocular lens  implant, bilateral    . Bilateral carpal tunnel release    . Appendectomy    . 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  MRI - left anterior frontal lobe mass; white matter changes extending posteriorly into parietal lobe and within left external capsule.  Underwent craniotomy and tumor resection 11/08/14.  Pt initially with nonfluent aphasia, improving.   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  [place] Diagnostic Aphasia Exam was administered: fluency is improved.  Repetition is generally intact, excluding low probability phrases.  Articulation is intact with occasional verbal paraphasias noted. Comprehension of complex ideational material and multistep commands lies near the 65th percentile.  Naming to confrontation 85th percentile; responsive and word-generation naming become increasingly more difficult as the material becomes less predictable and frequency of use decreases.  Pt demonstrates improved self-correction and semantic understanding, so that self-cueing for word-retrieval should be effective.  Mild deficits with problem-solving noted; however, pt is not impulsive and shows good insight.  Recommend speech/language therapy upon D/C.  Pt should be able to stay alone for short intervals of time as long as a means of communication is established for emergency situations.      SLP Assessment  Patient needs continued Speech Lanaguage Pathology Services    Follow Up Recommendations  Home health SLP    Frequency and Duration min 2x/week  1 week      SLP Goals  Potential to Achieve Goals (ACUTE ONLY): Good  SLP Evaluation Prior Functioning  Cognitive/Linguistic Baseline: Within functional limits Available Help at Discharge: Family   Cognition  Overall Cognitive Status: Within Functional Limits for tasks assessed Orientation Level: Oriented X4 Problem Solving: Impaired Problem Solving Impairment: Functional basic (difficulty using call Chelsea Cruz and tv remote)    Comprehension  Auditory Comprehension Overall Auditory Comprehension: Impaired Yes/No  Questions: Impaired Complex Questions:  (65th percentile) Commands: Impaired (70th percentile) Conversation: Simple EffectiveTechniques: Extra processing time;Visual/Gestural cues Nature conservation officer Discrimination: Exceptions to WFL (65th percentile) Reading Comprehension Reading Status: Impaired Word level: Within functional limits    Expression Expression Primary Mode of Expression: Verbal Verbal Expression Overall Verbal Expression: Impaired Initiation: No impairment Automatic Speech: Name;Social Response;Counting Level of Generative/Spontaneous Verbalization: Sentence Repetition: Impaired Level of Impairment: Word level (mild - literal paraphasias) Naming: Impairment Responsive:  (60th percentile) Confrontation: Impaired (85th percentile) Verbal Errors: Semantic paraphasias;Phonemic paraphasias Pragmatics: No impairment Written Expression Dominant Hand: Right Written Expression:  (able to write to dictation -occasional paragraphic errors)   Oral / Motor Oral Motor/Sensory Function Overall Oral Motor/Sensory Function: Appears within functional limits for tasks assessed Motor Speech Overall Motor Speech: Appears within functional limits for tasks assessed   Chelsea Cruz L. Chelsea Cruz, [place] CCC/SLP Pager [phone]      Chelsea Cruz Chelsea Cruz 11/12/2014, 12:13 PM

## 2014-11-12 NOTE — Progress Notes (Signed)
Patient awaiting speech therapy.  This is the reason for her continued hospitalization at this point.

## 2014-11-12 NOTE — Progress Notes (Signed)
Subjective: Patient reports marked improvement in speech today.  She is able to state name, age, answer simple questions.  Objective: Vital signs in last 24 hours: Temp:  [97.4 F (36.3 C)-98.3 F (36.8 C)] 97.5 F (36.4 C) (01/26 0937) Pulse Rate:  [57-80] 72 (01/26 0937) Resp:  [16] 16 (01/26 0937) BP: (107-133)/(45-53) 117/53 mmHg (01/26 0937) SpO2:  [96 %-98 %] 98 % (01/26 0937)  Intake/Output from previous day: 01/25 0701 - 01/26 0700 In: 480 [P.O.:480] Out: -  Intake/Output this shift:    Physical Exam: No drift.  Dressing CDI.  Marked improvement in speech.  Lab Results: No results for input(s): WBC, HGB, HCT, PLT in the last 72 hours. BMET No results for input(s): NA, K, CL, CO2, GLUCOSE, BUN, CREATININE, CALCIUM in the last 72 hours.  Studies/Results: No results found.  Assessment/Plan: Appreciate Speech c/s.  Discharge home.  F/U in office 14 days postop.    LOS: 4 days    Peggyann Shoals, MD 11/12/2014, 12:54 PM

## 2014-11-27 ENCOUNTER — Encounter: Payer: Self-pay | Admitting: Radiation Oncology

## 2014-11-29 ENCOUNTER — Telehealth: Payer: Self-pay | Admitting: Radiation Oncology

## 2014-11-29 ENCOUNTER — Encounter: Payer: Self-pay | Admitting: Radiation Oncology

## 2014-11-29 NOTE — Progress Notes (Signed)
FMLA paperwork rec'd for patient's daughter, Marcella Dubs.  Forwarded to RN for processing

## 2014-11-29 NOTE — Telephone Encounter (Signed)
Please call patient/family  May be OK to decrease dexamethasone to 4 mg BID for one week, then 2 mg BID for one week, then 2 mg once daily for one week then stop.  Agree continue Prilosec while on steroids.  Consult Wadie Lessen

## 2014-11-29 NOTE — Telephone Encounter (Signed)
Returned message left by patient's daughter, Joelene Millin. Joelene Millin explains that her mother had an appt with Dr. Vertell Limber this week and he questioned why she wasn't being tapered off decadron and keppra. Joelene Millin verbalized her mother is taking decadron 4 mg tid (0600,1400,2200) and keppra 500 mg bid. Also, Joelene Millin is concerned that her mother has been complaining of severe stomach pain since last Thursday. Reports her mother had to participate in PT while sitting down one day this week because of stomach pain and low blood pressure. PCP stopped bp medication thus, bp is coming up. Patient had stopped taking Prilosec because "she didn't need it." Explained to [place] her mother needs to take prilosec while taking decadron because of the increase gastric juices it causes. Encouraged her to have her mother resume Prilosec today and she verbalized understanding. She confirms her mother has regular bowel movements without blood and denies any episodes of nausea or vomiting. Reports her mother is eating a bland diet of cottage cheese, fruit, broth and graham cracker. Explains her mother has a great appetite upon discharge but, its decline with the stomach pain. Reports her mother hardly walks, seems much weaker, and her mental facilities are less. Joelene Millin reports having to use a gait belt to help her mother to stand and then she only will walk a few steps much different from the independent ambulation she was doing upon discharge. She reports her mother doesn't complain of a headache but, often grabs her head as if she is hurting. Reports her mother does complain often of feeling dizzy. Joelene Millin questions if her mother should have the CT of her lungs that Dr. Julien Nordmann scheduled for the 18th of this month. Explained this Probation officer would relay all this information to Dr. Tammi Klippel and call back with a plan of action.

## 2014-12-02 ENCOUNTER — Telehealth: Payer: Self-pay | Admitting: Radiation Oncology

## 2014-12-02 NOTE — Telephone Encounter (Signed)
Joelene Millin, patient's daughter, returned message left by this RN. Per Dr. Johny Shears order directed Joelene Millin to begin tapering her mother's decadron tomorrow. Explained its ok to decreased decadron to 4 mg bid for one week, then 2 mg BID for one week, then 2 mg once daily for one week then stop. Encouraged to contact this RN quickly if regression noted. Encouraged that prilosec be continued. Arranged for consultation with Wadie Lessen on 2/29 at 0900. Confirmed follow up for the same day. Transferred Joelene Millin to Dr. Worthy Flank scheduler as requested. Joelene Millin verbalized understanding of all discussed and expressed appreciation for the call.

## 2014-12-02 NOTE — Telephone Encounter (Signed)
Phoned [facility] cell and house number. Also, phoned [employer] [facility]. No answer at any of those but, left message requesting call back on all three.

## 2014-12-04 ENCOUNTER — Telehealth: Payer: Self-pay | Admitting: Radiation Oncology

## 2014-12-04 NOTE — Telephone Encounter (Signed)
Place orange folder with complete FMLA paperwork for Marcella Dubs, patient's daughter, in Dr. Johny Shears inbox to sign.

## 2014-12-05 ENCOUNTER — Other Ambulatory Visit (HOSPITAL_BASED_OUTPATIENT_CLINIC_OR_DEPARTMENT_OTHER): Payer: Medicare Other

## 2014-12-05 ENCOUNTER — Telehealth: Payer: Self-pay | Admitting: Radiation Oncology

## 2014-12-05 ENCOUNTER — Ambulatory Visit (HOSPITAL_COMMUNITY)
Admission: RE | Admit: 2014-12-05 | Discharge: 2014-12-05 | Disposition: A | Discharge status: Home / Self Care | Payer: Medicare Other | Source: Ambulatory Visit | Attending: Internal Medicine | Admitting: Internal Medicine

## 2014-12-05 ENCOUNTER — Encounter (HOSPITAL_COMMUNITY): Payer: Self-pay

## 2014-12-05 DIAGNOSIS — C3432 Malignant neoplasm of lower lobe, left bronchus or lung: Secondary | ICD-10-CM | POA: Insufficient documentation

## 2014-12-05 DIAGNOSIS — C3410 Malignant neoplasm of upper lobe, unspecified bronchus or lung: Secondary | ICD-10-CM

## 2014-12-05 LAB — CBC WITH DIFFERENTIAL/PLATELET
BASO%: 0 % (ref 0.0–2.0)
Basophils Absolute: 0 10*3/uL (ref 0.0–0.1)
EOS ABS: 0 10*3/uL (ref 0.0–0.5)
EOS%: 0 % (ref 0.0–7.0)
HCT: 37.6 % (ref 34.8–46.6)
HGB: 12.8 g/dL (ref 11.6–15.9)
LYMPH%: 10.1 % — AB (ref 14.0–49.7)
MCH: 28.5 pg (ref 25.1–34.0)
MCHC: 34 g/dL (ref 31.5–36.0)
MCV: 83.7 fL (ref 79.5–101.0)
MONO#: 0.2 10*3/uL (ref 0.1–0.9)
MONO%: 2.3 % (ref 0.0–14.0)
NEUT#: 7.6 10*3/uL — ABNORMAL HIGH (ref 1.5–6.5)
NEUT%: 87.6 % — AB (ref 38.4–76.8)
PLATELETS: 111 10*3/uL — AB (ref 145–400)
RBC: 4.49 10*6/uL (ref 3.70–5.45)
RDW: 16 % — ABNORMAL HIGH (ref 11.2–14.5)
WBC: 8.7 10*3/uL (ref 3.9–10.3)
lymph#: 0.9 10*3/uL (ref 0.9–3.3)
nRBC: 0 % (ref 0–0)

## 2014-12-05 LAB — COMPREHENSIVE METABOLIC PANEL (CC13)
ALK PHOS: 39 U/L — AB (ref 40–150)
ALT: 60 U/L — AB (ref 0–55)
ANION GAP: 12 meq/L — AB (ref 3–11)
AST: 17 U/L (ref 5–34)
Albumin: 3.4 g/dL — ABNORMAL LOW (ref 3.5–5.0)
BILIRUBIN TOTAL: 0.85 mg/dL (ref 0.20–1.20)
BUN: 27.6 mg/dL — ABNORMAL HIGH (ref 7.0–26.0)
CO2: 25 mEq/L (ref 22–29)
Calcium: 9.3 mg/dL (ref 8.4–10.4)
Chloride: 96 mEq/L — ABNORMAL LOW (ref 98–109)
Creatinine: 0.7 mg/dL (ref 0.6–1.1)
EGFR: 86 mL/min/{1.73_m2} — ABNORMAL LOW (ref >90)
Glucose: 113 mg/dl (ref 70–140)
Potassium: 4.9 mEq/L (ref 3.5–5.1)
SODIUM: 132 meq/L — AB (ref 136–145)
TOTAL PROTEIN: 6.1 g/dL — AB (ref 6.4–8.3)

## 2014-12-05 LAB — TECHNOLOGIST REVIEW: Technologist Review: 1

## 2014-12-05 MED ORDER — IOHEXOL 300 MG/ML  SOLN
80.0000 mL | Freq: Once | INTRAMUSCULAR | Status: AC | PRN
  Administered 2014-12-05: 80 mL via INTRAVENOUS

## 2014-12-05 NOTE — Telephone Encounter (Signed)
Received call from patient's daughter, Joelene Millin, this morning. She reports her mother has had a "good last two days." She goes on to question if it is safe to give her mother Tums. Explained it is. She verbalized understanding. Reminded her of CT scan today at 4:30, follow up with [facility] on 2/24 and [place] on Monday. She confirmed all three.

## 2014-12-08 NOTE — Addendum Note (Signed)
Encounter addended by: Erline Levine, MD on: 12/08/2014  8:33 PM<BR>     Documentation filed: Clinical Notes

## 2014-12-10 ENCOUNTER — Encounter: Payer: Self-pay | Admitting: Radiation Oncology

## 2014-12-10 NOTE — Progress Notes (Signed)
2.23.16:  Rec'd FMLA paperwork back signed/completed from physician.  Fax to [employer] at [phone].

## 2014-12-11 ENCOUNTER — Encounter: Payer: Self-pay | Admitting: Internal Medicine

## 2014-12-11 ENCOUNTER — Ambulatory Visit: Payer: Medicare Other | Admitting: Radiation Oncology

## 2014-12-11 ENCOUNTER — Telehealth: Payer: Self-pay | Admitting: Internal Medicine

## 2014-12-11 ENCOUNTER — Ambulatory Visit (HOSPITAL_BASED_OUTPATIENT_CLINIC_OR_DEPARTMENT_OTHER): Payer: Medicare Other | Admitting: Internal Medicine

## 2014-12-11 VITALS — BP 126/57 | HR 126 | Temp 98.5°F | Resp 19 | Ht 61.0 in | Wt 121.0 lb

## 2014-12-11 DIAGNOSIS — C3492 Malignant neoplasm of unspecified part of left bronchus or lung: Secondary | ICD-10-CM

## 2014-12-11 DIAGNOSIS — R531 Weakness: Secondary | ICD-10-CM

## 2014-12-11 DIAGNOSIS — C3432 Malignant neoplasm of lower lobe, left bronchus or lung: Secondary | ICD-10-CM

## 2014-12-11 DIAGNOSIS — C7931 Secondary malignant neoplasm of brain: Secondary | ICD-10-CM

## 2014-12-11 NOTE — Telephone Encounter (Signed)
gv and printedj appt sched and avs for pt for May and June

## 2014-12-11 NOTE — Progress Notes (Signed)
[facility] Telephone:([phone]) [phone]   Fax:(336) [place], [place] [address]  DIAGNOSIS: Metastatic non-small cell lung cancer initially diagnosed as Unresectable Stage IIB (T3, N0, M0) non-small cell lung cancer, poorly differentiated squamous cell carcinoma diagnosed in June of 2014.  The patient was diagnosed with metastatic lesion to the brain in January 2016.  PRIOR THERAPY:  1) Concurrent chemoradiation with weekly carboplatin for AUC of 2 and paclitaxel 45 mg/M2. Status post 5 cycles. First cycle was given on 04/09/2013. Last dose was given on 05/21/2013 with partial response.  2) Consolidation chemotherapy with carboplatin for AUC of 4 and paclitaxel 150 mg/M2 every 3 weeks with Neulasta support. First dose given on 07/31/2013. Status post 3 cycles with stable disease. 3) status post a stereotactic radiotherapy under the care of Dr. Tammi Klippel on 11/06/2014. 4) status post craniotomy with tumor resection under the care of Dr. Vertell Limber on 11/08/2014.  CURRENT THERAPY: Observation.  CHEMOTHERAPY INTENT: Control/curative  CURRENT # OF CHEMOTHERAPY CYCLES: 0 CURRENT ANTIEMETICS: Zofran, dexamethasone and Compazine  CURRENT SMOKING STATUS: Former smoker  ORAL CHEMOTHERAPY AND CONSENT: None  CURRENT BISPHOSPHONATES USE: None  PAIN MANAGEMENT: 0/10  NARCOTICS INDUCED CONSTIPATION: None  LIVING WILL AND CODE STATUS: Full code   INTERVAL HISTORY: Chelsea Cruz 74 y.o. female returns to the clinic today for followup visit accompanied by her 2 daughters and son-in-law. The patient was diagnosed with metastatic brain lesion in January 2016 when she presented with speech difficulty and MRI of the brain on 10/28/2014 showed solitary enhancing anterior left frontal lobe mass measuring 2.9 x 2.8 x 3.4 cm. There was internal hemorrhoids noted with surrounding significant vasogenic edema and significant mass  effect with midline shift up to 4 mm. The patient was started on Decadron and underwent a stereotactic radiotherapy followed by craniotomy and tumor resection.she is currently undergoing physical therapy but continues to have weakness in the right lower extremity. The patient denied having any specific complaints except for occasional shortness of breath with exertion and dry cough.  She denied having any significant chest pain, or hemoptysis. The patient denied having any nausea or vomiting. She denied having any fever or chills. She has no weight loss or night sweats. She had repeat CT scan of the chest performed recently and she is here for evaluation and discussion of her scan results.  MEDICAL HISTORY: Past Medical History  Diagnosis Date  . Nodule of left lung   . Hypertension   . Nicotine addiction   . Osteopenia   . Osteomalacia   . Impaired fasting glucose   . Hypercholesteremia   . Depression   . Lung cancer dx'd 02/2013  . 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 COPD (chronic obstructive pulmonary disease)   . GERD (gastroesophageal reflux disease)     ALLERGIES:  is allergic to penicillins.  MEDICATIONS:  Current Outpatient Prescriptions  Medication Sig Dispense Refill  . aspirin 325 MG EC tablet Take 325 mg by mouth daily as needed for pain.    . cholecalciferol (VITAMIN D) 1000 UNITS tablet Take 2,000 Units by mouth daily. Takes 3 capsules    . dexamethasone (DECADRON) 4 MG tablet Take 1 tablet (4 mg total) by mouth 3 (three) times daily. (Patient not taking: Reported on 12/02/2014) 60 tablet 0  . HYDROcodone-acetaminophen (NORCO/VICODIN) 5-325 MG per tablet Take 1 tablet  by mouth every 4 (four) hours as needed for moderate pain. 40 tablet 0  . HYDROcodone-homatropine (HYCODAN) 5-1.5 MG/5ML syrup Take 5 mLs by mouth every 6 (six) hours as needed for cough. (Patient not taking: Reported on 10/31/2014) 120 mL 0  . levETIRAcetam (KEPPRA) 500  MG tablet Take 1 tablet (500 mg total) by mouth 2 (two) times daily. 60 tablet 1  . lisinopril (PRINIVIL,ZESTRIL) 10 MG tablet Take 10 mg by mouth daily.   1  . pantoprazole (PROTONIX) 40 MG tablet Take 1 tablet (40 mg total) by mouth at bedtime. 30 tablet 0   No current facility-administered medications for this visit.    SURGICAL HISTORY:  Past Surgical History  Procedure Laterality Date  . Cholecystectomy    . Right oophorectomy      1969  . Cataract extraction w/ intraocular lens  implant, bilateral    . Bilateral carpal tunnel release    . Appendectomy    . Craniotomy N/A 11/08/2014    Procedure: CRANIOTOMY TUMOR EXCISION;  Surgeon: Erline Levine, MD;  Location: [facility];  Service: Neurosurgery;  Laterality: N/A;  CRANIOTOMY TUMOR EXCISION    REVIEW OF SYSTEMS:  A comprehensive review of systems was negative except for: Respiratory: positive for dyspnea on exertion Musculoskeletal: positive for muscle weakness Neurological: positive for weakness   PHYSICAL EXAMINATION: General appearance: alert, cooperative and no distress Head: Normocephalic, without obvious abnormality, atraumatic Neck: no adenopathy, no JVD, supple, symmetrical, trachea midline and thyroid not enlarged, symmetric, no tenderness/mass/nodules Lymph nodes: Cervical, supraclavicular, and axillary nodes normal. Resp: clear to auscultation bilaterally Back: symmetric, no curvature. ROM normal. No CVA tenderness. Cardio: regular rate and rhythm, S1, S2 normal, no murmur, click, rub or gallop GI: soft, non-tender; bowel sounds normal; no masses,  no organomegaly Extremities: extremities normal, atraumatic, no cyanosis or edema Neurologic: Alert and oriented X 3, normal strength and tone. Normal symmetric reflexes. Normal coordination and gait  ECOG PERFORMANCE STATUS: 2 - Symptomatic, <50% confined to bed  There were no vitals taken for this visit.  LABORATORY DATA: Lab Results  Component Value Date   WBC  8.7 12/05/2014   HGB 12.8 12/05/2014   HCT 37.6 12/05/2014   MCV 83.7 12/05/2014   PLT 111* 12/05/2014      Chemistry      Component Value Date/Time   NA 132* 12/05/2014 1524   NA 139 11/05/2014 1107   K 4.9 12/05/2014 1524   K 4.3 11/05/2014 1107   CL 105 11/05/2014 1107   CL 98 04/09/2013 1114   CO2 25 12/05/2014 1524   CO2 28 11/05/2014 1107   BUN 27.6* 12/05/2014 1524   BUN 28* 11/05/2014 1107   CREATININE 0.7 12/05/2014 1524   CREATININE 0.97 11/05/2014 1107      Component Value Date/Time   CALCIUM 9.3 12/05/2014 1524   CALCIUM 9.1 11/05/2014 1107   ALKPHOS 39* 12/05/2014 1524   AST 17 12/05/2014 1524   ALT 60* 12/05/2014 1524   BILITOT 0.85 12/05/2014 1524       RADIOGRAPHIC STUDIES: Ct Chest W Contrast  12/06/2014   CLINICAL DATA:  Subsequent treatment strategy for lung cancer diagnosed in December 2014. Pre metastasis.  EXAM: CT CHEST WITH CONTRAST  TECHNIQUE: Multidetector CT imaging of the chest was performed during intravenous contrast administration.  CONTRAST:  85m OMNIPAQUE IOHEXOL 300 MG/ML  SOLN  COMPARISON:  CT 07/31/2014, PET-CT 03/16/2013  FINDINGS: Mediastinum/Nodes: No axillary or supraclavicular lymphadenopathy. No mediastinal lymphadenopathy. There is left peribronchial  thickening (image 23, series 2). The thickening posterior to lingula bronchus measures 9 mm compared to 11 mm on prior.  Lungs/Pleura: Perihilar masslike thickening surrounds the left upper lobe bronchus (same lesion as above) and measure approximately 22 x 20 mm on lung window series Image 22. This is also similar to prior where lesion measured 25 x 24 mm on image 23, series 5, 07/31/2014  There is volume loss in the left hemi thorax. There is perihilar consolidation and bronchiectasis in the left lower lobe which is slightly improved compared prior. Interval improvement in the right left pleural effusion compared to prior.  No pulmonary nodules on the right  Upper abdomen: Thickening of  the adrenal glands is unchanged comparison exam. Adrenal thickening was not hypermetabolic comparison PET-CT scan.  Musculoskeletal: No aggressive osseous lesion.  IMPRESSION: 1. No evidence of lung cancer progression. 2. Left perihilar masslike soft tissue thickening surrounding the central upper lobe bronchi is not changed. 3. Interval improvement in the left lower lobe consolidation and pleural fluid.   Electronically Signed   By: Suzy Bouchard M.D.   On: 12/06/2014 08:30   ASSESSMENT AND PLAN:  She is a very pleasant 74 years old white female with metastatic non-small cell lung cancer initially diagnosed as unresectable stage IIB non-small cell lung cancer. She status post concurrent chemoradiation and recently completed 3 cycles of consolidation chemotherapy with carboplatin and paclitaxel. She was recently found to have metastatic brain lesion in January 2016 and the patient underwent stereotactic radiotherapy followed by surgical resection of the brain tumor. She is slowly recovering and continues to have weakness in the right lower extremity. The recent CT scan of the chest showed no evidence for disease recurrence in the chest. I discussed the scan results with the patient and her daughter today. I recommended for her to continue on observation for now with repeat CT scan of the chest, abdomen and pelvis in 3 months. She would have repeat imaging studies of the brain by Dr. Tammi Klippel. She was advised to call immediately if she has any concerning symptoms in the interval.  The patient voices understanding of current disease status and treatment options and is in agreement with the current care plan.  All questions were answered. The patient knows to call the clinic with any problems, questions or concerns. We can certainly see the patient much sooner if necessary.  Disclaimer: This note was dictated with voice recognition software. Similar sounding words can inadvertently be transcribed and  may not be corrected upon review.   Eilleen Kempf., MD 12/11/2014

## 2014-12-12 ENCOUNTER — Encounter: Payer: Self-pay | Admitting: Internal Medicine

## 2014-12-12 NOTE — Progress Notes (Signed)
Put fmla form on nurse's desk °

## 2014-12-16 ENCOUNTER — Ambulatory Visit
Admission: RE | Admit: 2014-12-16 | Discharge: 2014-12-16 | Disposition: A | Discharge status: Home / Self Care | Payer: Medicare Other | Source: Ambulatory Visit | Attending: Radiation Oncology | Admitting: Radiation Oncology

## 2014-12-16 ENCOUNTER — Encounter: Payer: Self-pay | Admitting: Radiation Oncology

## 2014-12-16 ENCOUNTER — Encounter: Payer: Self-pay | Admitting: Internal Medicine

## 2014-12-16 VITALS — BP 113/51 | HR 109 | Resp 16 | Wt 121.0 lb

## 2014-12-16 DIAGNOSIS — C7931 Secondary malignant neoplasm of brain: Secondary | ICD-10-CM

## 2014-12-16 DIAGNOSIS — Z7189 Other specified counseling: Secondary | ICD-10-CM | POA: Diagnosis not present

## 2014-12-16 NOTE — Progress Notes (Signed)
Faxed daughter's fmla form to [place] [facility] [place] @ [phone]

## 2014-12-16 NOTE — Consult Note (Signed)
Patient CB:JSEGBTD RAKIA FRAYNE      DOB: 03-Apr-1941      VVO:[id number]     Consult Note from the Palliative Medicine Team at [facility] Requested by:     PCP: Cari Caraway, MD Reason for Consultation              Phone                                                                                                                                [phone]  Assessment of patients Current state:  Metastatic non-small cell lung cancer diagnosed in June 2014, new lesion in the brain January 2016.  S/P chemotherapy and radiation,  No current signs of disease progression, however she continues to struggle with the physical and functional decline associated with disease and its treatments. Patient and family face advanced directive decisions and anticipatory care needs.   Consult is for introduction to the concept of Palliative Medicine, clarification of Advanced Directives,  holistic support and symptom recommendations as indicated  This NP Wadie Lessen reviewed medical records, received report from team, assessed the patient and then meet with  Lynden Ang and her two daughters Johnnette Litter and Donnie Coffin (lives with this daughter) in the outpatient oncology clinic.  A detailed discussion was had today regarding advanced directives.  Concepts specific to code status, artifical feeding and hydration, continued IV antibiotics and rehospitalization was had.  The difference between a aggressive medical intervention path  and a palliative comfort care path for this patient at this time was had.  Values and goals of care important to patient and family were attempted to be elicited.  Concept of Hospice and Palliative Care were discussed    Questions and concerns addressed.   Family encouraged to call with questions or concerns.  PMT will continue to support holistically.    Goals of Care: 1.  Code Status:  Full code   2. Scope of Treatment:  At this time patient is open to  all available and offered medcial interventions to prolong quality life.  Contacted home health agency to get bath aides into care plan.   4. Symptom Management:   Fatigue:-Pace yourself -Plan your day -Include naps and breaks  -schedule a relaxing day -get a little exercise -fuel the body -consider complementary therapies   -deep breathing   -prayer/medication   -guided meditation 1. Difficulty with word expression/aphasia     -discussed utilization of a deck of cards, naming numbers and suits     -taking time prior to verbalization  2. Pain:  Reports intermittant headache is relieved by Tylenol, discussed dose limitations    5. Psychosocial:  Emotional support offered to patient and her family  6. Spiritual: Strong community church support   Patient Documents Completed or Given: Document Given Completed  Advanced Directives Pkt    MOST X   DNR    Gone from My  [facility]    Hard Choices     HPI:    Metastatic non-small cell lung cancer diagnosed in June 2014, s/p concurrent chemoradiation stable disease, no evidenced of lung disease progression from CT 12-05-2014.  The patient was diagnosed with a metastatic brain lesion in January 2016 and is s/p resections and radiation.  She now is struggling with physical and functional deficits    ROS: weakness h/o falls, unsteady gait, difficulty with word recall, fatigue   PMH:  Past Medical History  Diagnosis Date  . Nodule of left lung   . Hypertension   . Nicotine addiction   . Osteopenia   . Osteomalacia   . Impaired fasting glucose   . Hypercholesteremia   . Depression   . Lung cancer dx'd 02/2013  . 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 COPD (chronic obstructive pulmonary disease)   . GERD (gastroesophageal reflux disease)      PSH: Past Surgical History  Procedure Laterality Date  . Cholecystectomy    . Right oophorectomy      1969  . Cataract extraction w/ intraocular  lens  implant, bilateral    . Bilateral carpal tunnel release    . Appendectomy    . Craniotomy N/A 11/08/2014    Procedure: CRANIOTOMY TUMOR EXCISION;  Surgeon: Erline Levine, MD;  Location: [facility];  Service: Neurosurgery;  Laterality: N/A;  CRANIOTOMY TUMOR EXCISION   I have reviewed the FH and SH and  If appropriate update it with new information. Allergies  Allergen Reactions  . Penicillins Swelling   Scheduled Meds: Continuous Infusions: PRN Meds:.    There were no vitals taken for this visit.   PPS:40 % at best  No intake or output data in the 24 hours ending 12/16/14 0832  Physical Exam:  General: chronically ill appearing, weak and frail, sitting in wheelchair HEENT:  Moist buccal membranes Neuro: oriented X3 moves all four extremities  Labs: CBC    Component Value Date/Time   WBC 8.7 12/05/2014 1524   WBC 14.7* 11/05/2014 1107   RBC 4.49 12/05/2014 1524   RBC 4.77 11/05/2014 1107   HGB 12.8 12/05/2014 1524   HGB 13.7 11/05/2014 1107   HCT 37.6 12/05/2014 1524   HCT 40.9 11/05/2014 1107   PLT 111* 12/05/2014 1524   PLT 344 11/05/2014 1107   MCV 83.7 12/05/2014 1524   MCV 85.7 11/05/2014 1107   MCH 28.5 12/05/2014 1524   MCH 28.7 11/05/2014 1107   MCHC 34.0 12/05/2014 1524   MCHC 33.5 11/05/2014 1107   RDW 16.0* 12/05/2014 1524   RDW 15.3 11/05/2014 1107   LYMPHSABS 0.9 12/05/2014 1524   MONOABS 0.2 12/05/2014 1524   EOSABS 0.0 12/05/2014 1524   BASOSABS 0.0 12/05/2014 1524    BMET    Component Value Date/Time   NA 132* 12/05/2014 1524   NA 139 11/05/2014 1107   K 4.9 12/05/2014 1524   K 4.3 11/05/2014 1107   CL 105 11/05/2014 1107   CL 98 04/09/2013 1114   CO2 25 12/05/2014 1524   CO2 28 11/05/2014 1107   GLUCOSE 113 12/05/2014 1524   GLUCOSE 98 11/05/2014 1107   GLUCOSE 102* 04/09/2013 1114   BUN 27.6* 12/05/2014 1524   BUN 28* 11/05/2014 1107   CREATININE 0.7 12/05/2014 1524   CREATININE 0.97 11/05/2014 1107   CALCIUM 9.3  12/05/2014 1524   CALCIUM 9.1 11/05/2014 1107   GFRNONAA 56*  11/05/2014 1107   GFRAA 65* 11/05/2014 1107    CMP     Component Value Date/Time   NA 132* 12/05/2014 1524   NA 139 11/05/2014 1107   K 4.9 12/05/2014 1524   K 4.3 11/05/2014 1107   CL 105 11/05/2014 1107   CL 98 04/09/2013 1114   CO2 25 12/05/2014 1524   CO2 28 11/05/2014 1107   GLUCOSE 113 12/05/2014 1524   GLUCOSE 98 11/05/2014 1107   GLUCOSE 102* 04/09/2013 1114   BUN 27.6* 12/05/2014 1524   BUN 28* 11/05/2014 1107   CREATININE 0.7 12/05/2014 1524   CREATININE 0.97 11/05/2014 1107   CALCIUM 9.3 12/05/2014 1524   CALCIUM 9.1 11/05/2014 1107   PROT 6.1* 12/05/2014 1524   ALBUMIN 3.4* 12/05/2014 1524   AST 17 12/05/2014 1524   ALT 60* 12/05/2014 1524   ALKPHOS 39* 12/05/2014 1524   BILITOT 0.85 12/05/2014 1524   GFRNONAA 56* 11/05/2014 1107   GFRAA 65* 11/05/2014 1107   ECOG PERFORMANCE STATUS* (Eastern Cooperative Oncology Group)  0 Fully active, able to continue with all pre-disease activities without restriction. Pt score  1 Restricted in physically strenuous activity but ambulatory and able to carry out work of a light or sedentary nature, e.g., light house work, office work.   2 Ambulatory and capable of all self-care but unable to carry out any work activities. Up and about more than 50% of waking hours.    3 Capable of only limited self-care. Confined to bed or chair more than 50% of waking hours. 3  4 Completely disabled. Cannot carry on any self-care. Totally confined to bed or chair.   5 Dead.    As published in Am. J. Clin. Oncol.: Eustace Pen, M.M., Colon Flattery., [place], D.C., [place], Sharen Hint., Drexel Iha, P.P.: Toxicity And Response Criteria Of The [facility]. [place] [phone]:[phone], 1982.  The ECOG Performance Status is in the public domain therefore available for public use. To duplicate the scale, please cite the reference above and credit the [facility], Tyler Pita M.D., Group Chair    Time In Time Out Total Time Spent with Patient Total Overall Time  1000 1115 70 min 75 min    Greater than 50%  of this time was spent counseling and coordinating care related to the above assessment and plan.   Wadie Lessen NP  Palliative Medicine Team Team Phone # [phone] Pager [phone]  Discussed with Dr Tammi Klippel

## 2014-12-16 NOTE — Addendum Note (Signed)
Encounter addended by: Heywood Footman, RN on: 12/16/2014  9:40 AM<BR>     Documentation filed: Notes Section

## 2014-12-16 NOTE — Progress Notes (Signed)
Radiation Oncology         [phone] ________________________________  Name: Chelsea Cruz MRN: [id number]  Date: 12/16/2014  DOB: October 02, 1941  Follow-Up Visit Note  CC: Cari Caraway, MD  Cari Caraway, MD  Diagnosis:   74 year old woman with a solitary 3.4 cm left frontal brain metastasis from squamous cell carcinoma of the left upper lung s/p pre-op SRS 11/06/2014 to 15 Gy.    ICD-9-CM ICD-10-CM   1. Solitary Left Frontal 3.4 cm Brain Metastasis 198.3 C79.31     Interval Since Last Radiation:  4  weeks  Narrative:  The patient returns today for routine follow-up.  Patient reports recent falls. Bruising noted along her legs, below her left eye and hands. Patient reports an unsteady gait. Reports at time she can walk with her walker but, other time her daughter have to use the gait belt to help her stand. Reports she will begin taking  decadron 2 mg once a day for a week tomorrow. Difficulty with word recall is slowly improving. Reports short term memory is worse at night when she is tired. Denies seizure activity. Hand tremors noted. Reports GI upset resolved with TUM and reflux medicaton.                               ALLERGIES:  is allergic to penicillins.  Meds: Current Outpatient Prescriptions  Medication Sig Dispense Refill  . cholecalciferol (VITAMIN D) 1000 UNITS tablet Take 2,000 Units by mouth daily. Takes 3 capsules    . dexamethasone (DECADRON) 4 MG tablet Take 1 tablet (4 mg total) by mouth 3 (three) times daily. 60 tablet 0  . levETIRAcetam (KEPPRA) 500 MG tablet Take 1 tablet (500 mg total) by mouth 2 (two) times daily. 60 tablet 1  . pantoprazole (PROTONIX) 40 MG tablet Take 1 tablet (40 mg total) by mouth at bedtime. 30 tablet 0  . HYDROcodone-acetaminophen (NORCO/VICODIN) 5-325 MG per tablet Take 1 tablet by mouth every 4 (four) hours as needed for moderate pain. (Patient not taking: Reported on 12/16/2014) 40 tablet 0  . HYDROcodone-homatropine (HYCODAN) 5-1.5  MG/5ML syrup Take 5 mLs by mouth every 6 (six) hours as needed for cough. (Patient not taking: Reported on 10/31/2014) 120 mL 0  . lisinopril (PRINIVIL,ZESTRIL) 10 MG tablet Take 10 mg by mouth daily.   1   No current facility-administered medications for this encounter.    Physical Findings: The patient is in no acute distress. Patient is alert and oriented.  weight is 121 lb (54.885 kg). Her blood pressure is 113/51 and her pulse is 109. Her respiration is 16. .  Crani incision well healing.  LE weakness bilaterally in proximal muscle groups.  No significant changes.  Lab Findings: Lab Results  Component Value Date   WBC 8.7 12/05/2014   WBC 14.7* 11/05/2014   HGB 12.8 12/05/2014   HGB 13.7 11/05/2014   HCT 37.6 12/05/2014   HCT 40.9 11/05/2014   PLT 111* 12/05/2014   PLT 344 11/05/2014    Lab Results  Component Value Date   NA 132* 12/05/2014   NA 139 11/05/2014   K 4.9 12/05/2014   K 4.3 11/05/2014   CHLORIDE 96* 12/05/2014   CO2 25 12/05/2014   CO2 28 11/05/2014   GLUCOSE 113 12/05/2014   GLUCOSE 98 11/05/2014   GLUCOSE 102* 04/09/2013   BUN 27.6* 12/05/2014   BUN 28* 11/05/2014   CREATININE 0.7 12/05/2014   CREATININE  0.97 11/05/2014   BILITOT 0.85 12/05/2014   ALKPHOS 39* 12/05/2014   AST 17 12/05/2014   ALT 60* 12/05/2014   PROT 6.1* 12/05/2014   ALBUMIN 3.4* 12/05/2014   CALCIUM 9.3 12/05/2014   CALCIUM 9.1 11/05/2014   ANIONGAP 12* 12/05/2014   ANIONGAP 6 11/05/2014    Radiographic Findings: Ct Chest W Contrast  12/06/2014   CLINICAL DATA:  Subsequent treatment strategy for lung cancer diagnosed in December 2014. Pre metastasis.  EXAM: CT CHEST WITH CONTRAST  TECHNIQUE: Multidetector CT imaging of the chest was performed during intravenous contrast administration.  CONTRAST:  83mL OMNIPAQUE IOHEXOL 300 MG/ML  SOLN  COMPARISON:  CT 07/31/2014, PET-CT 03/16/2013  FINDINGS: Mediastinum/Nodes: No axillary or supraclavicular lymphadenopathy. No mediastinal  lymphadenopathy. There is left peribronchial thickening (image 23, series 2). The thickening posterior to lingula bronchus measures 9 mm compared to 11 mm on prior.  Lungs/Pleura: Perihilar masslike thickening surrounds the left upper lobe bronchus (same lesion as above) and measure approximately 22 x 20 mm on lung window series Image 22. This is also similar to prior where lesion measured 25 x 24 mm on image 23, series 5, 07/31/2014  There is volume loss in the left hemi thorax. There is perihilar consolidation and bronchiectasis in the left lower lobe which is slightly improved compared prior. Interval improvement in the right left pleural effusion compared to prior.  No pulmonary nodules on the right  Upper abdomen: Thickening of the adrenal glands is unchanged comparison exam. Adrenal thickening was not hypermetabolic comparison PET-CT scan.  Musculoskeletal: No aggressive osseous lesion.  IMPRESSION: 1. No evidence of lung cancer progression. 2. Left perihilar masslike soft tissue thickening surrounding the central upper lobe bronchi is not changed. 3. Interval improvement in the left lower lobe consolidation and pleural fluid.   Electronically Signed   By: Suzy Bouchard M.D.   On: 12/06/2014 08:30    Impression:  The patient is recovering from the effects of radiation.    Plan:  MRI in 2 months, then follow-up.  _____________________________________  Sheral Apley. Tammi Klippel, M.D.

## 2014-12-16 NOTE — Progress Notes (Addendum)
Patient reports recent falls. Bruising noted along her legs, below her left eye and hands. Patient reports an unsteady gait. Reports at time she can walk with her walker but, other time her daughter have to use the gait belt to help her stand. Reports she will begin taking  decadron 2 mg once a day for a week tomorrow. Difficulty with word recall is slowly improving. Reports short term memory is worse at night when she is tired. Denies seizure activity. Hand tremors noted. Reports GI upset resolved with TUM and reflux medicaton. No evidence of thrush noted. U shaped left parietal incision well approximated without redness, drainage or edema.

## 2014-12-24 ENCOUNTER — Inpatient Hospital Stay (HOSPITAL_BASED_OUTPATIENT_CLINIC_OR_DEPARTMENT_OTHER)
Admission: EM | Admit: 2014-12-24 | Discharge: 2014-12-31 | DRG: 871 | Disposition: A | Discharge status: Skilled Nursing Facility | Payer: Medicare Other | Attending: Internal Medicine | Admitting: Internal Medicine

## 2014-12-24 ENCOUNTER — Emergency Department (HOSPITAL_BASED_OUTPATIENT_CLINIC_OR_DEPARTMENT_OTHER): Payer: Medicare Other

## 2014-12-24 ENCOUNTER — Encounter (HOSPITAL_BASED_OUTPATIENT_CLINIC_OR_DEPARTMENT_OTHER): Payer: Self-pay | Admitting: *Deleted

## 2014-12-24 DIAGNOSIS — J188 Other pneumonia, unspecified organism: Secondary | ICD-10-CM | POA: Diagnosis present

## 2014-12-24 DIAGNOSIS — E7800 Pure hypercholesterolemia, unspecified: Secondary | ICD-10-CM | POA: Diagnosis present

## 2014-12-24 DIAGNOSIS — Z820 Family history of epilepsy and other diseases of the nervous system: Secondary | ICD-10-CM

## 2014-12-24 DIAGNOSIS — R0602 Shortness of breath: Secondary | ICD-10-CM | POA: Diagnosis present

## 2014-12-24 DIAGNOSIS — Z808 Family history of malignant neoplasm of other organs or systems: Secondary | ICD-10-CM | POA: Diagnosis not present

## 2014-12-24 DIAGNOSIS — J9601 Acute respiratory failure with hypoxia: Secondary | ICD-10-CM | POA: Diagnosis present

## 2014-12-24 DIAGNOSIS — Z79899 Other long term (current) drug therapy: Secondary | ICD-10-CM

## 2014-12-24 DIAGNOSIS — I959 Hypotension, unspecified: Secondary | ICD-10-CM | POA: Diagnosis present

## 2014-12-24 DIAGNOSIS — Z87891 Personal history of nicotine dependence: Secondary | ICD-10-CM

## 2014-12-24 DIAGNOSIS — E785 Hyperlipidemia, unspecified: Secondary | ICD-10-CM | POA: Diagnosis present

## 2014-12-24 DIAGNOSIS — C7931 Secondary malignant neoplasm of brain: Secondary | ICD-10-CM | POA: Diagnosis present

## 2014-12-24 DIAGNOSIS — I471 Supraventricular tachycardia: Secondary | ICD-10-CM | POA: Diagnosis present

## 2014-12-24 DIAGNOSIS — M8580 Other specified disorders of bone density and structure, unspecified site: Secondary | ICD-10-CM | POA: Diagnosis present

## 2014-12-24 DIAGNOSIS — R945 Abnormal results of liver function studies: Secondary | ICD-10-CM

## 2014-12-24 DIAGNOSIS — I10 Essential (primary) hypertension: Secondary | ICD-10-CM | POA: Diagnosis present

## 2014-12-24 DIAGNOSIS — C3432 Malignant neoplasm of lower lobe, left bronchus or lung: Secondary | ICD-10-CM | POA: Diagnosis present

## 2014-12-24 DIAGNOSIS — D638 Anemia in other chronic diseases classified elsewhere: Secondary | ICD-10-CM | POA: Diagnosis present

## 2014-12-24 DIAGNOSIS — Z8249 Family history of ischemic heart disease and other diseases of the circulatory system: Secondary | ICD-10-CM | POA: Diagnosis not present

## 2014-12-24 DIAGNOSIS — I4892 Unspecified atrial flutter: Secondary | ICD-10-CM | POA: Diagnosis present

## 2014-12-24 DIAGNOSIS — J09X1 Influenza due to identified novel influenza A virus with pneumonia: Secondary | ICD-10-CM | POA: Diagnosis present

## 2014-12-24 DIAGNOSIS — I2699 Other pulmonary embolism without acute cor pulmonale: Secondary | ICD-10-CM | POA: Diagnosis present

## 2014-12-24 DIAGNOSIS — Z79891 Long term (current) use of opiate analgesic: Secondary | ICD-10-CM | POA: Diagnosis not present

## 2014-12-24 DIAGNOSIS — J449 Chronic obstructive pulmonary disease, unspecified: Secondary | ICD-10-CM | POA: Diagnosis present

## 2014-12-24 DIAGNOSIS — K219 Gastro-esophageal reflux disease without esophagitis: Secondary | ICD-10-CM | POA: Diagnosis present

## 2014-12-24 DIAGNOSIS — Z823 Family history of stroke: Secondary | ICD-10-CM

## 2014-12-24 DIAGNOSIS — Z880 Allergy status to penicillin: Secondary | ICD-10-CM | POA: Diagnosis not present

## 2014-12-24 DIAGNOSIS — J189 Pneumonia, unspecified organism: Secondary | ICD-10-CM | POA: Insufficient documentation

## 2014-12-24 DIAGNOSIS — A419 Sepsis, unspecified organism: Principal | ICD-10-CM | POA: Diagnosis present

## 2014-12-24 LAB — I-STAT ARTERIAL BLOOD GAS, ED
Acid-Base Excess: 2 mmol/L (ref 0.0–2.0)
BICARBONATE: 26.4 meq/L — AB (ref 20.0–24.0)
O2 Saturation: 96 %
TCO2: 28 mmol/L (ref 0–100)
pCO2 arterial: 39.6 mmHg (ref 35.0–45.0)
pH, Arterial: 7.433 (ref 7.350–7.450)
pO2, Arterial: 83 mmHg (ref 80.0–100.0)

## 2014-12-24 LAB — COMPREHENSIVE METABOLIC PANEL
ALK PHOS: 55 U/L (ref 39–117)
ALT: 81 U/L — AB (ref 0–35)
AST: 21 U/L (ref 0–37)
Albumin: 3.2 g/dL — ABNORMAL LOW (ref 3.5–5.2)
Anion gap: 3 — ABNORMAL LOW (ref 5–15)
BUN: 22 mg/dL (ref 6–23)
CO2: 28 mmol/L (ref 19–32)
Calcium: 8.7 mg/dL (ref 8.4–10.5)
Chloride: 106 mmol/L (ref 96–112)
Creatinine, Ser: 0.53 mg/dL (ref 0.50–1.10)
GLUCOSE: 126 mg/dL — AB (ref 70–99)
POTASSIUM: 3.9 mmol/L (ref 3.5–5.1)
Sodium: 137 mmol/L (ref 135–145)
Total Bilirubin: 1.4 mg/dL — ABNORMAL HIGH (ref 0.3–1.2)
Total Protein: 6.2 g/dL (ref 6.0–8.3)

## 2014-12-24 LAB — CBC WITH DIFFERENTIAL/PLATELET
Band Neutrophils: 2 % (ref 0–10)
Basophils Absolute: 0 10*3/uL (ref 0.0–0.1)
Basophils Relative: 0 % (ref 0–1)
EOS PCT: 0 % (ref 0–5)
Eosinophils Absolute: 0 10*3/uL (ref 0.0–0.7)
HCT: 35.2 % — ABNORMAL LOW (ref 36.0–46.0)
Hemoglobin: 11.2 g/dL — ABNORMAL LOW (ref 12.0–15.0)
LYMPHS PCT: 41 % (ref 12–46)
Lymphs Abs: 1.9 10*3/uL (ref 0.7–4.0)
MCH: 28.4 pg (ref 26.0–34.0)
MCHC: 31.8 g/dL (ref 30.0–36.0)
MCV: 89.1 fL (ref 78.0–100.0)
MONO ABS: 0.3 10*3/uL (ref 0.1–1.0)
Monocytes Relative: 7 % (ref 3–12)
NEUTROS ABS: 2.4 10*3/uL (ref 1.7–7.7)
NRBC: 3 /100{WBCs} — AB
Neutrophils Relative %: 50 % (ref 43–77)
PLATELETS: 175 10*3/uL (ref 150–400)
RBC: 3.95 MIL/uL (ref 3.87–5.11)
RDW: 19.9 % — AB (ref 11.5–15.5)
WBC: 4.6 10*3/uL (ref 4.0–10.5)

## 2014-12-24 LAB — TROPONIN I: TROPONIN I: 0.03 ng/mL (ref <0.031)

## 2014-12-24 LAB — I-STAT CG4 LACTIC ACID, ED: Lactic Acid, Venous: 1.22 mmol/L (ref 0.5–2.0)

## 2014-12-24 MED ORDER — SODIUM CHLORIDE 0.9 % IV BOLUS (SEPSIS)
1000.0000 mL | Freq: Once | INTRAVENOUS | Status: AC
  Administered 2014-12-24: 1000 mL via INTRAVENOUS

## 2014-12-24 MED ORDER — HEPARIN BOLUS VIA INFUSION: 3000.0000 [IU] | Freq: Once | INTRAVENOUS | Status: DC

## 2014-12-24 MED ORDER — HEPARIN (PORCINE) IN NACL 100-0.45 UNIT/ML-% IJ SOLN
950.0000 [IU]/h | INTRAMUSCULAR | Status: DC
  Administered 2014-12-24: 950 [IU]/h via INTRAVENOUS
  Filled 2014-12-24: qty 250

## 2014-12-24 MED ORDER — HEPARIN (PORCINE) IN NACL 100-0.45 UNIT/ML-% IJ SOLN: 950.0000 [IU]/h | INTRAMUSCULAR | Status: DC

## 2014-12-24 MED ORDER — SODIUM CHLORIDE 0.9 % IV BOLUS (SEPSIS)
1000.0000 mL | Freq: Once | INTRAVENOUS | Status: AC
Start: 2014-12-24 — End: 2014-12-24
  Administered 2014-12-24: 1000 mL via INTRAVENOUS

## 2014-12-24 MED ORDER — LIDOCAINE VISCOUS 2 % MT SOLN
OROMUCOSAL | Status: AC
  Filled 2014-12-24: qty 15

## 2014-12-24 MED ORDER — VANCOMYCIN HCL IN DEXTROSE 1-5 GM/200ML-% IV SOLN
1000.0000 mg | Freq: Once | INTRAVENOUS | Status: AC
  Administered 2014-12-24: 1000 mg via INTRAVENOUS
  Filled 2014-12-24: qty 200

## 2014-12-24 MED ORDER — ACETAMINOPHEN 325 MG PO TABS
650.0000 mg | ORAL_TABLET | Freq: Once | ORAL | Status: AC
  Administered 2014-12-24: 650 mg via ORAL
  Filled 2014-12-24: qty 2

## 2014-12-24 MED ORDER — DEXTROSE 5 % IV SOLN
2.0000 g | Freq: Once | INTRAVENOUS | Status: AC
  Administered 2014-12-25: 2 g via INTRAVENOUS
  Filled 2014-12-24 (×2): qty 2

## 2014-12-24 MED ORDER — IOHEXOL 350 MG/ML SOLN
80.0000 mL | Freq: Once | INTRAVENOUS | Status: AC | PRN
  Administered 2014-12-24: 80 mL via INTRAVENOUS

## 2014-12-24 NOTE — ED Notes (Addendum)
Patient has been coughing/SOB for the past two days, low grade fever 99.8.  Had brain surgery on 11/08/14 to remove a tumor and also had radiation for lung cancer in the past. Some diarrhea yesterday

## 2014-12-24 NOTE — ED Notes (Signed)
EDP was notified R Marcum,RN assessed pt vein selection with BS US-r/t concern for CT chest-advised to place BS [place] at [facility] and he would assess-done

## 2014-12-24 NOTE — ED Provider Notes (Addendum)
CSN: [id number]     Arrival date & time 12/24/14  1755 History  This chart was scribed for Sherwood Gambler, MD by Chester Holstein, ED Scribe. This patient was seen in room MH01/MH01 and the patient's care was started at 6:18 PM.    Chief Complaint  Patient presents with  . Shortness of Breath    Patient is a 74 y.o. female presenting with shortness of breath. The history is provided by the patient and a relative. No language interpreter was used.  Shortness of Breath Associated symptoms: chest pain, cough and fever (low grade)   Associated symptoms: no wheezing    HPI Comments: Chelsea Cruz is a 74 y.o. female with PMHx of lung CA, brain CA, COPD, HTN, and HLD who presents to the Emergency Department complaining of SOB with onset today.  Pt's family member notes associated dry cough for 2 days, intermittent chest pain and palpitations for a week, a low grade fever (nursing note reports 99.8), rhinorrhea, right foot swelling for couple weeks, and facial swelling. Pt recently fell and injured right foot. Pt is s/p craniotomy and brain tumor removal on 11/08/14.   Pt just came off steroids following surgery. Pt denies bilateral leg swelling, pain in calves, and chest pain/palpitations currently.   Past Medical History  Diagnosis Date  . Nodule of left lung   . Hypertension   . Nicotine addiction   . Osteopenia   . Osteomalacia   . Impaired fasting glucose   . Hypercholesteremia   . Depression   . COPD (chronic obstructive pulmonary disease)   . GERD (gastroesophageal reflux disease)   . Lung cancer dx'd 02/2013  . 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 Cholecystectomy    . Right oophorectomy      1969  . Cataract extraction w/ intraocular lens  implant, bilateral    . Bilateral carpal tunnel release    . Appendectomy    . Craniotomy N/A 11/08/2014    Procedure: CRANIOTOMY TUMOR EXCISION;   Surgeon: Erline Levine, MD;  Location: [facility];  Service: Neurosurgery;  Laterality: N/A;  CRANIOTOMY TUMOR EXCISION  . Brain surgery      tumor removal 11/08/2014   Family History  Problem Relation Age of Onset  . Heart disease Mother   . Coronary artery disease Brother   . Alzheimer's disease Brother   . Heart disease Brother   . Hypertension Brother     #2  . Stroke Brother     #2  . Brain cancer Brother     #3   History  Substance Use Topics  . Smoking status: Former Smoker -- 1.00 packs/day for 45 years    Types: Cigarettes    Quit date: 03/06/2013  . Smokeless tobacco: Never Used  . Alcohol Use: No   OB History    No data available     Review of Systems  Constitutional: Positive for fever (low grade).  HENT: Positive for facial swelling.   Respiratory: Positive for cough and shortness of breath. Negative for wheezing.   Cardiovascular: Positive for chest pain, palpitations and leg swelling.  All other systems reviewed and are negative.     Allergies  Penicillins  Home Medications   Prior to Admission medications   Medication Sig Start Date End Date Taking? Authorizing Provider  cholecalciferol (VITAMIN D) 1000 UNITS tablet Take 2,000 Units by  mouth daily. Takes 3 capsules    Historical Provider, MD  dexamethasone (DECADRON) 4 MG tablet Take 1 tablet (4 mg total) by mouth 3 (three) times daily. 11/12/14   Erline Levine, MD  HYDROcodone-acetaminophen (NORCO/VICODIN) 5-325 MG per tablet Take 1 tablet by mouth every 4 (four) hours as needed for moderate pain. Patient not taking: Reported on 12/16/2014 11/12/14   Erline Levine, MD  HYDROcodone-homatropine [facility]) 5-1.5 MG/5ML syrup Take 5 mLs by mouth every 6 (six) hours as needed for cough. Patient not taking: Reported on 10/31/2014 04/30/14   Curt Bears, MD  levETIRAcetam (KEPPRA) 500 MG tablet Take 1 tablet (500 mg total) by mouth 2 (two) times daily. 11/12/14   Erline Levine, MD  lisinopril  (PRINIVIL,ZESTRIL) 10 MG tablet Take 10 mg by mouth daily.  10/21/14   Historical Provider, MD  pantoprazole (PROTONIX) 40 MG tablet Take 1 tablet (40 mg total) by mouth at bedtime. 11/12/14   Erline Levine, MD   BP 108/53 mmHg  Pulse 116  Temp(Src) 99.1 F (37.3 C)  Resp 23  Wt 125 lb (56.7 kg)  SpO2 99% Physical Exam  Constitutional: She is oriented to person, place, and time. She appears well-developed and well-nourished.  HENT:  Head: Normocephalic and atraumatic.  Right Ear: External ear normal.  Left Ear: External ear normal.  Nose: Nose normal.  Healed craniotomy scar on left  Neck: Neck supple.  Cardiovascular: Regular rhythm and normal heart sounds.  Tachycardia present.   Pulmonary/Chest: Tachypnea noted. She has decreased breath sounds. She has no wheezes. She has no rales.  mildly diminished lungs sounds   Abdominal: She exhibits no distension.  Musculoskeletal: She exhibits no edema.       Right foot: There is swelling (diffuse swelling and ecchymosis).  right ankle and leg ar otherwise normal; no pitting edema of the lower extremities  Neurological: She is alert and oriented to person, place, and time.  Skin: Skin is warm and dry.  Psychiatric: She has a normal mood and affect. Her behavior is normal.  Nursing note and vitals reviewed.   ED Course  Procedures (including critical care time) DIAGNOSTIC STUDIES: Oxygen Saturation is 93% on 3L O2, adequate by my interpretation.    COORDINATION OF CARE: 6:25 PM Discussed treatment plan with patient at beside, the patient agrees with the plan and has no further questions at this time.   Labs Review Labs Reviewed  COMPREHENSIVE METABOLIC PANEL - Abnormal; Notable for the following:    Glucose, Bld 126 (*)    Albumin 3.2 (*)    ALT 81 (*)    Total Bilirubin 1.4 (*)    Anion gap 3 (*)    All other components within normal limits  CBC WITH DIFFERENTIAL/PLATELET - Abnormal; Notable for the following:    Hemoglobin  11.2 (*)    HCT 35.2 (*)    RDW 19.9 (*)    nRBC 3 (*)    All other components within normal limits  I-STAT ARTERIAL BLOOD GAS, ED - Abnormal; Notable for the following:    Bicarbonate 26.4 (*)    All other components within normal limits  CULTURE, BLOOD (ROUTINE X 2)  CULTURE, BLOOD (ROUTINE X 2)  TROPONIN I  INFLUENZA PANEL BY PCR (TYPE A & B, H1N1)  I-STAT CG4 LACTIC ACID, ED    Imaging Review Ct Angio Chest Pe W/cm &/or Wo Cm  12/24/2014   CLINICAL DATA:  Palpitations since yesterday. Shortness of breath. Left lung cancer about 20 months  ago with radiation and chemotherapy.  EXAM: CT ANGIOGRAPHY CHEST WITH CONTRAST  TECHNIQUE: Multidetector CT imaging of the chest was performed using the standard protocol during bolus administration of intravenous contrast. Multiplanar CT image reconstructions and MIPs were obtained to evaluate the vascular anatomy.  CONTRAST:  3mL OMNIPAQUE IOHEXOL 350 MG/ML SOLN  COMPARISON:  12/05/2014  FINDINGS: Technically adequate study with good opacification of the central and segmental pulmonary arteries. Filling defects are demonstrated in the baby right upper lobe anterior segmental pulmonary artery consistent with focal acute pulmonary embolus. This is new since the previous study. No large central pulmonary emboli. Ventricular measurements are limited but RV to LV ratio appears to be increased at 1.2. This may be artifactual due to motion artifact but right heart strain is not excluded.  Normal heart size. Normal caliber thoracic aorta. No aortic dissection. Great vessel origins are patent. Calcification in the coronary arteries and aorta. Esophagus is decompressed.  There is diffuse scarring, consolidation, and volume loss in the left lung with masslike infiltration in the left hilum. This corresponds to the patient's numb carcinoma and likely represents combination of mass and postobstructive change with radiation changes. Degree of consolidation is slightly more  prominent than on the prior study. Diffuse emphysematous changes throughout the aerated portions of both lungs. No pleural effusions. No pneumothorax.  Included portions of the upper abdominal organs are grossly unremarkable. Degenerative changes in the spine. Old left rib fractures. No destructive bone lesions.  Review of the MIP images confirms the above findings.  IMPRESSION: Positive examination for pulmonary embolus involving right upper lung segmental pulmonary artery. Heartbeat LV ratio is increased. This may be artifactual but right heart strain is not excluded. Left hilar mass with postobstructive change and radiation change. Mild progression of consolidation since prior study.  These results were called by telephone at the time of interpretation on 12/24/2014 at 9:24 pm to Dr. Sherwood Gambler , who verbally acknowledged these results.   Electronically Signed   By: Lucienne Capers M.D.   On: 12/24/2014 21:26   Dg Chest Port 1 View  12/24/2014   CLINICAL DATA:  sob onset today with a dry cough x 2 days and intermittent chest pain and palpitations x 1 week. States she also has had right foot swelling x 2 weeks. Hx of lung cancer, brain cancer, COPD, HTN and radiation therapy  EXAM: PORTABLE CHEST - 1 VIEW  COMPARISON:  CT 12/05/2014 and earlier studies  FINDINGS: Elevated left diaphragmatic leaflet. Consolidation/atelectasis in the left mid and lower lung. Right lung clear. Heart size difficult to assess due to adjacent opacity and leftward mediastinal shift. Chronic blunting of the left lateral costophrenic angle. No pneumothorax. Visualized skeletal structures are unremarkable.  IMPRESSION: 1. Left volume loss and parenchymal changes as before, without acute or superimposed abnormality.   Electronically Signed   By: Lucrezia Europe M.D.   On: 12/24/2014 18:49     EKG Interpretation   Date/Time:  Tuesday December 24 2014 19:48:13 EST Ventricular Rate:  106 PR Interval:  118 QRS Duration: 76 QT Interval:   312 QTC Calculation: 414 R Axis:   -35 Text Interpretation:  Sinus tachycardia Possible Left atrial enlargement  Left axis deviation Possible Anterior infarct , age undetermined Abnormal  ECG rate is faster compared to Jan 2016 Confirmed by Regenia Skeeter  MD, [place]  [phone]) on 12/24/2014 11:23:36 PM      Angiocath insertion Performed by: Sherwood Gambler T  Consent: Verbal consent obtained. Risks and benefits: risks,  benefits and alternatives were discussed Time out: Immediately prior to procedure a "time out" was called to verify the correct patient, procedure, equipment, support staff and site/side marked as required.  Preparation: Patient was prepped and draped in the usual sterile fashion.  Vein Location: right basilic  Ultrasound Guided  Gauge: 20  Normal blood return and flush without difficulty Patient tolerance: Patient tolerated the procedure well with no immediate complications.     CRITICAL CARE Performed by: Sherwood Gambler T   Total critical care time: 30 minutes  Critical care time was exclusive of separately billable procedures and treating other patients.  Critical care was necessary to treat or prevent imminent or life-threatening deterioration.  Critical care was time spent personally by me on the following activities: development of treatment plan with patient and/or surrogate as well as nursing, discussions with consultants, evaluation of patient's response to treatment, examination of patient, obtaining history from patient or surrogate, ordering and performing treatments and interventions, ordering and review of laboratory studies, ordering and review of radiographic studies, pulse oximetry and re-evaluation of patient's condition.  MDM   Final diagnoses:  Pulmonary embolism   Patient has evidence of acute pulmonary embolism. This likely causing her hypoxia. Lowest O2 sats were 87% as noted by respiratory therapy. Is saturating well with 2 L nasal cannula. No  focal bronchospasm noted on auscultation. No pneumonia on chest x-ray or CT scan. Has been tachycardic throughout her ER stay, prior to leaving to be admitted she has developed some mild hypotension. Blood pressures been in the low 90s. Give second liter of IV fluids. Will reconsult to Dr. Arnoldo Morale of the hospitalist, who recommends upgrade to step down as well as giving antibiotics for possible pneumonia. Blood cultures obtained first. While she has dropped her pressure some here, CT shows segmental PE, no evidence of more proximal PE that would be suggestive on needing thrombolysis. Prior to starting on heparin, I consulted Dr. Vertell Limber, her neurosurgeon, and he feels she is far enough post op to get heparin.     I personally performed the services described in this documentation, which was scribed in my presence. The recorded information has been reviewed and is accurate.     Sherwood Gambler, MD 12/24/14 [id number]  Sherwood Gambler, MD 12/24/14 [phone]

## 2014-12-24 NOTE — ED Notes (Signed)
MD at bedside.-pt made aware of current disposition

## 2014-12-24 NOTE — Progress Notes (Signed)
ANTICOAGULATION CONSULT NOTE - Initial Consult  Pharmacy Consult for heparin Indication: pulmonary embolus  Allergies  Allergen Reactions  . Penicillins Swelling    Patient Measurements: Weight: 125 lb (56.7 kg) Heparin Dosing Weight: 56.7kg  Vital Signs: Temp: 98.6 F (37 C) (03/08 2038) Temp Source: Oral (03/08 2038) BP: 104/61 mmHg (03/08 2125) Pulse Rate: 108 (03/08 2125)  Labs:  Recent Labs  12/24/14 1915  HGB 11.2*  HCT 35.2*  PLT 175  CREATININE 0.53  TROPONINI 0.03    Estimated Creatinine Clearance: 45.8 mL/min (by C-G formula based on Cr of 0.53).   Medical History: Past Medical History  Diagnosis Date  . Nodule of left lung   . Hypertension   . Nicotine addiction   . Osteopenia   . Osteomalacia   . Impaired fasting glucose   . Hypercholesteremia   . Depression   . COPD (chronic obstructive pulmonary disease)   . GERD (gastroesophageal reflux disease)   . Lung cancer dx'd 02/2013  . 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 heparin    . heparin      Assessment: 64 yof presented to the ED with SOB and cough. Found to have a RUL PE. To start IV heparin for anticoagulation. Baseline H/H is slightly low but platelets are WNL.   Goal of Therapy:  Heparin level 0.3-0.7 units/ml Monitor platelets by anticoagulation protocol: Yes   Plan:  1. Heparin bolus 3000 units IV x 1 2. Heparin gtt 950 units/hr 3. Check an 8 hour heparin level 4. Daily heparin level and CBC 5. F/u plans for oral anticoagulation  Kyvon Hu, Rande Lawman 12/24/2014,9:39 PM

## 2014-12-25 ENCOUNTER — Encounter (HOSPITAL_COMMUNITY): Payer: Self-pay | Admitting: *Deleted

## 2014-12-25 DIAGNOSIS — I2699 Other pulmonary embolism without acute cor pulmonale: Secondary | ICD-10-CM | POA: Insufficient documentation

## 2014-12-25 DIAGNOSIS — K219 Gastro-esophageal reflux disease without esophagitis: Secondary | ICD-10-CM | POA: Diagnosis present

## 2014-12-25 DIAGNOSIS — C3432 Malignant neoplasm of lower lobe, left bronchus or lung: Secondary | ICD-10-CM

## 2014-12-25 DIAGNOSIS — K210 Gastro-esophageal reflux disease with esophagitis: Secondary | ICD-10-CM

## 2014-12-25 DIAGNOSIS — J449 Chronic obstructive pulmonary disease, unspecified: Secondary | ICD-10-CM | POA: Diagnosis present

## 2014-12-25 DIAGNOSIS — R945 Abnormal results of liver function studies: Secondary | ICD-10-CM

## 2014-12-25 DIAGNOSIS — I10 Essential (primary) hypertension: Secondary | ICD-10-CM

## 2014-12-25 DIAGNOSIS — C7931 Secondary malignant neoplasm of brain: Secondary | ICD-10-CM

## 2014-12-25 LAB — CBC
HCT: 27.8 % — ABNORMAL LOW (ref 36.0–46.0)
Hemoglobin: 8.8 g/dL — ABNORMAL LOW (ref 12.0–15.0)
MCH: 28.3 pg (ref 26.0–34.0)
MCHC: 31.7 g/dL (ref 30.0–36.0)
MCV: 89.4 fL (ref 78.0–100.0)
Platelets: 148 10*3/uL — ABNORMAL LOW (ref 150–400)
RBC: 3.11 MIL/uL — ABNORMAL LOW (ref 3.87–5.11)
RDW: 19.2 % — AB (ref 11.5–15.5)
WBC: 4.6 10*3/uL (ref 4.0–10.5)

## 2014-12-25 LAB — STREP PNEUMONIAE URINARY ANTIGEN: Strep Pneumo Urinary Antigen: NEGATIVE

## 2014-12-25 LAB — HEPATITIS PANEL, ACUTE
HCV AB: NEGATIVE
HEP A IGM: NONREACTIVE
HEP B C IGM: NONREACTIVE
Hepatitis B Surface Ag: NEGATIVE

## 2014-12-25 LAB — APTT: aPTT: 138 seconds — ABNORMAL HIGH (ref 24–37)

## 2014-12-25 LAB — LACTIC ACID, PLASMA
LACTIC ACID, VENOUS: 1.5 mmol/L (ref 0.5–2.0)
Lactic Acid, Venous: 1.6 mmol/L (ref 0.5–2.0)

## 2014-12-25 LAB — PROCALCITONIN: Procalcitonin: <0.1 ng/mL

## 2014-12-25 LAB — BILIRUBIN, DIRECT: BILIRUBIN DIRECT: 0.3 mg/dL (ref 0.0–0.5)

## 2014-12-25 LAB — HEPARIN LEVEL (UNFRACTIONATED): HEPARIN UNFRACTIONATED: 0.75 [IU]/mL — AB (ref 0.30–0.70)

## 2014-12-25 LAB — PROTIME-INR
INR: 1.27 (ref 0.00–1.49)
Prothrombin Time: 16 seconds — ABNORMAL HIGH (ref 11.6–15.2)

## 2014-12-25 MED ORDER — DEXTROSE 5 % IV SOLN
1.0000 g | Freq: Three times a day (TID) | INTRAVENOUS | Status: DC
  Administered 2014-12-25 – 2014-12-29 (×12): 1 g via INTRAVENOUS
  Filled 2014-12-25 (×12): qty 1

## 2014-12-25 MED ORDER — OXYCODONE HCL 5 MG PO TABS: 5.0000 mg | ORAL_TABLET | ORAL | Status: DC | PRN

## 2014-12-25 MED ORDER — PANTOPRAZOLE SODIUM 40 MG PO TBEC
40.0000 mg | DELAYED_RELEASE_TABLET | Freq: Every day | ORAL | Status: DC
  Administered 2014-12-25 – 2014-12-30 (×7): 40 mg via ORAL
  Filled 2014-12-25 (×12): qty 1

## 2014-12-25 MED ORDER — VANCOMYCIN HCL IN DEXTROSE 750-5 MG/150ML-% IV SOLN: 750.0000 mg | Freq: Three times a day (TID) | INTRAVENOUS | Status: DC

## 2014-12-25 MED ORDER — SODIUM CHLORIDE 0.9 % IV SOLN
INTRAVENOUS | Status: DC
  Administered 2014-12-25: 75 mL/h via INTRAVENOUS
  Administered 2014-12-25: 02:00:00 via INTRAVENOUS

## 2014-12-25 MED ORDER — CHOLECALCIFEROL 10 MCG (400 UNIT) PO TABS
400.0000 [IU] | ORAL_TABLET | Freq: Every day | ORAL | Status: DC
  Administered 2014-12-25 – 2014-12-31 (×7): 400 [IU] via ORAL
  Filled 2014-12-25 (×7): qty 1

## 2014-12-25 MED ORDER — AZTREONAM 1 G IJ SOLR: 1.0000 g | Freq: Three times a day (TID) | INTRAMUSCULAR | Status: DC

## 2014-12-25 MED ORDER — ACETAMINOPHEN 325 MG PO TABS
650.0000 mg | ORAL_TABLET | Freq: Four times a day (QID) | ORAL | Status: DC | PRN
  Administered 2014-12-25 – 2014-12-28 (×4): 650 mg via ORAL
  Filled 2014-12-25 (×4): qty 2

## 2014-12-25 MED ORDER — ENOXAPARIN (LOVENOX) PATIENT EDUCATION KIT
PACK | Freq: Once | Status: AC
  Administered 2014-12-25: 16:00:00
  Filled 2014-12-25: qty 1

## 2014-12-25 MED ORDER — CETYLPYRIDINIUM CHLORIDE 0.05 % MT LIQD
7.0000 mL | Freq: Two times a day (BID) | OROMUCOSAL | Status: DC
  Administered 2014-12-25 – 2014-12-31 (×14): 7 mL via OROMUCOSAL

## 2014-12-25 MED ORDER — HEPARIN (PORCINE) IN NACL 100-0.45 UNIT/ML-% IJ SOLN
900.0000 [IU]/h | INTRAMUSCULAR | Status: AC
  Filled 2014-12-25: qty 250

## 2014-12-25 MED ORDER — CALCIUM CARBONATE ANTACID 500 MG PO CHEW: 2.0000 | CHEWABLE_TABLET | Freq: Three times a day (TID) | ORAL | Status: DC | PRN

## 2014-12-25 MED ORDER — LEVETIRACETAM 500 MG PO TABS
500.0000 mg | ORAL_TABLET | Freq: Two times a day (BID) | ORAL | Status: DC
  Administered 2014-12-25 – 2014-12-31 (×14): 500 mg via ORAL
  Filled 2014-12-25 (×14): qty 1

## 2014-12-25 MED ORDER — ALBUTEROL SULFATE (2.5 MG/3ML) 0.083% IN NEBU
2.5000 mg | INHALATION_SOLUTION | RESPIRATORY_TRACT | Status: DC | PRN
  Filled 2014-12-25: qty 3

## 2014-12-25 MED ORDER — VANCOMYCIN HCL 500 MG IV SOLR
500.0000 mg | Freq: Two times a day (BID) | INTRAVENOUS | Status: DC
  Administered 2014-12-25 – 2014-12-27 (×4): 500 mg via INTRAVENOUS
  Filled 2014-12-25 (×5): qty 500

## 2014-12-25 MED ORDER — HYDROCOD POLST-CHLORPHEN POLST 10-8 MG/5ML PO LQCR: 5.0000 mL | Freq: Two times a day (BID) | ORAL | Status: DC | PRN

## 2014-12-25 MED ORDER — ENOXAPARIN SODIUM 80 MG/0.8ML ~~LOC~~ SOLN
80.0000 mg | SUBCUTANEOUS | Status: DC
  Administered 2014-12-25 – 2014-12-30 (×6): 80 mg via SUBCUTANEOUS
  Filled 2014-12-25 (×8): qty 0.8

## 2014-12-25 MED ORDER — MORPHINE SULFATE 2 MG/ML IJ SOLN: 1.0000 mg | INTRAMUSCULAR | Status: DC | PRN

## 2014-12-25 NOTE — Progress Notes (Signed)
Patient 's temperature was 100.38F (oral).  PCP on call was notified.

## 2014-12-25 NOTE — Progress Notes (Signed)
OT Cancellation Note  Patient Details Name: Chelsea Cruz MRN: [id number] DOB: [birth year]   Cancelled Treatment:    Reason Eval/Treat Not Completed: Medical issues which prohibited therapy - will await 24 hrs on heparin and therapeutic level prior to mobilizing  Betsy Pries 12/25/2014, 12:18 PM

## 2014-12-25 NOTE — H&P (Signed)
[facility] History and Physical  Chelsea Cruz IWL:[id number] DOB: 1941-08-11 DOA: 12/24/2014  Referring physician: ED physician PCP: Cari Caraway, MD  Specialists:   Chief Complaint: Dry cough, shortness of breath, runny nose, fever  HPI: Chelsea Cruz is a 74 y.o. 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 Vertell Limber), who presents with dry cough, shortness of breath, runny nose and fever.  Patient has dry cough, chest pain and shortness of breath for about 1 week. It is associated with palpitation. She also had fever and chills with temperature 101 at home. She has runny nose, no sore throat. Patient does not have sick contact. She reports that she had loose stool on Monday which has resolved after she took Imodium. Patient denies dysuria, urgency, frequency, hematuria, skin rashes r leg swelling. No unilateral weakness, numbness or tingling sensations. No vision change or hearing loss.  In ED, patient was found to have oxygen desaturation to 87% and pulmonary embolism by CT angiogram, which cannot rule out right heart straining. CT angiogram also showed mild progression of consolidation in left lung since prior study. Blood pressures was soft at low 90s, which improved after 2 liter of IV fluids. She was found to have tachycardia, lactate 1.22, negative troponin, no leukocytosis, temperature 98.1, electrolytes okay. Chest x-ray is negative for acute abnormalities. Patient is admitted to stepdown unit.  Review of Systems: As presented in the history of presenting illness, rest negative.  Where does patient live?  At home Can patient participate in ADLs? barely  Allergy:  Allergies  Allergen Reactions  . Penicillins Swelling    Past Medical History  Diagnosis Date  . Nodule of left lung   . Hypertension   . Nicotine addiction    . Osteopenia   . Osteomalacia   . Impaired fasting glucose   . Hypercholesteremia   . Depression   . COPD (chronic obstructive pulmonary disease)   . GERD (gastroesophageal reflux disease)   . Lung cancer dx'd 02/2013  . 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 Cholecystectomy    . Right oophorectomy      1969  . Cataract extraction w/ intraocular lens  implant, bilateral    . Bilateral carpal tunnel release    . Appendectomy    . Craniotomy N/A 11/08/2014    Procedure: CRANIOTOMY TUMOR EXCISION;  Surgeon: Erline Levine, MD;  Location: [place] [facility] [facility];  Service: Neurosurgery;  Laterality: N/A;  CRANIOTOMY TUMOR EXCISION  . Brain surgery      tumor removal 11/08/2014    Social History:  reports that she quit smoking about 21 months ago. Her smoking use included Cigarettes. She has a 45 pack-year smoking history. She has never used smokeless tobacco. She reports that she does not drink alcohol or use illicit drugs.  Family History:  Family History  Problem Relation Age of Onset  . Heart disease Mother   . Coronary artery disease Brother   . Alzheimer's disease Brother   . Heart disease Brother   . Hypertension Brother     #2  . Stroke Brother     #2  . Brain cancer Brother     #3     Prior to Admission medications   Medication Sig Start Date End Date Taking? Authorizing  Provider  cholecalciferol (VITAMIN D) 1000 UNITS tablet Take 2,000 Units by mouth daily. Takes 3 capsules    Historical Provider, MD  dexamethasone (DECADRON) 4 MG tablet Take 1 tablet (4 mg total) by mouth 3 (three) times daily. 11/12/14   Erline Levine, MD  HYDROcodone-acetaminophen (NORCO/VICODIN) 5-325 MG per tablet Take 1 tablet by mouth every 4 (four) hours as needed for moderate pain. Patient not taking: Reported on 12/16/2014 11/12/14   Erline Levine, MD  HYDROcodone-homatropine [facility]) 5-1.5 MG/5ML syrup Take 5 mLs  by mouth every 6 (six) hours as needed for cough. Patient not taking: Reported on 10/31/2014 04/30/14   Curt Bears, MD  levETIRAcetam (KEPPRA) 500 MG tablet Take 1 tablet (500 mg total) by mouth 2 (two) times daily. 11/12/14   Erline Levine, MD  lisinopril (PRINIVIL,ZESTRIL) 10 MG tablet Take 10 mg by mouth daily.  10/21/14   Historical Provider, MD  pantoprazole (PROTONIX) 40 MG tablet Take 1 tablet (40 mg total) by mouth at bedtime. 11/12/14   Erline Levine, MD    Physical Exam: Filed Vitals:   12/24/14 2330 12/25/14 0000 12/25/14 0059 12/25/14 0434  BP: 108/48 105/51 107/57 117/50  Pulse: 96 75 94 104  Temp:   97.6 F (36.4 C) 98.2 F (36.8 C)  TempSrc:   Oral Oral  Resp: $Remo'24 22 20 20  'ZYhDc$ Height:   '5\' 1"'$  (1.549 m)   Weight:   55.7 kg (122 lb 12.7 oz)   SpO2: 95% 95% 100% 95%   General: Not in acute distress HEENT:       Eyes: PERRL, EOMI, no scleral icterus       ENT: No discharge from the ears and nose, no pharynx injection, no tonsillar enlargement.        Neck: No JVD, no bruit, no mass felt. Cardiac: S1/S2, RRR, No murmurs, No gallops or rubs Pulm: No rales, wheezing, rhonchi or rubs. Abd: Soft, nondistended, nontender, no rebound pain, no organomegaly, BS present Ext: No edema bilaterally. 2+DP/PT pulse bilaterally Musculoskeletal: No joint deformities, erythema, or stiffness, ROM full Skin: No rashes.  Neuro: Alert and oriented X3, cranial nerves II-XII grossly intact, muscle strength 5/5 in all extremeties, sensation to light touch intact. Brachial reflex 1+ bilaterally. Knee reflex 1+ bilaterally. Negative Babinski's sign.  Psych: Patient is not psychotic, no suicidal or hemocidal ideation.  Labs on Admission:  Basic Metabolic Panel:  Recent Labs Lab 12/24/14 1915  NA 137  K 3.9  CL 106  CO2 28  GLUCOSE 126*  BUN 22  CREATININE 0.53  CALCIUM 8.7   Liver Function Tests:  Recent Labs Lab 12/24/14 1915  AST 21  ALT 81*  ALKPHOS 55  BILITOT 1.4*  PROT 6.2   ALBUMIN 3.2*   No results for input(s): LIPASE, AMYLASE in the last 168 hours. No results for input(s): AMMONIA in the last 168 hours. CBC:  Recent Labs Lab 12/24/14 1915  WBC 4.6  NEUTROABS 2.4  HGB 11.2*  HCT 35.2*  MCV 89.1  PLT 175   Cardiac Enzymes:  Recent Labs Lab 12/24/14 1915  TROPONINI 0.03    BNP (last 3 results) No results for input(s): BNP in the last 8760 hours.  ProBNP (last 3 results) No results for input(s): PROBNP in the last 8760 hours.  CBG: No results for input(s): GLUCAP in the last 168 hours.  Radiological Exams on Admission: Ct Angio Chest Pe W/cm &/or Wo Cm  12/24/2014   CLINICAL DATA:  Palpitations since yesterday. Shortness of breath.  Left lung cancer about 20 months ago with radiation and chemotherapy.  EXAM: CT ANGIOGRAPHY CHEST WITH CONTRAST  TECHNIQUE: Multidetector CT imaging of the chest was performed using the standard protocol during bolus administration of intravenous contrast. Multiplanar CT image reconstructions and MIPs were obtained to evaluate the vascular anatomy.  CONTRAST:  19mL OMNIPAQUE IOHEXOL 350 MG/ML SOLN  COMPARISON:  12/05/2014  FINDINGS: Technically adequate study with good opacification of the central and segmental pulmonary arteries. Filling defects are demonstrated in the baby right upper lobe anterior segmental pulmonary artery consistent with focal acute pulmonary embolus. This is new since the previous study. No large central pulmonary emboli. Ventricular measurements are limited but RV to LV ratio appears to be increased at 1.2. This may be artifactual due to motion artifact but right heart strain is not excluded.  Normal heart size. Normal caliber thoracic aorta. No aortic dissection. Great vessel origins are patent. Calcification in the coronary arteries and aorta. Esophagus is decompressed.  There is diffuse scarring, consolidation, and volume loss in the left lung with masslike infiltration in the left hilum. This  corresponds to the patient's numb carcinoma and likely represents combination of mass and postobstructive change with radiation changes. Degree of consolidation is slightly more prominent than on the prior study. Diffuse emphysematous changes throughout the aerated portions of both lungs. No pleural effusions. No pneumothorax.  Included portions of the upper abdominal organs are grossly unremarkable. Degenerative changes in the spine. Old left rib fractures. No destructive bone lesions.  Review of the MIP images confirms the above findings.  IMPRESSION: Positive examination for pulmonary embolus involving right upper lung segmental pulmonary artery. Heartbeat LV ratio is increased. This may be artifactual but right heart strain is not excluded. Left hilar mass with postobstructive change and radiation change. Mild progression of consolidation since prior study.  These results were called by telephone at the time of interpretation on 12/24/2014 at 9:24 pm to Dr. Sherwood Gambler , who verbally acknowledged these results.   Electronically Signed   By: Lucienne Capers M.D.   On: 12/24/2014 21:26   Dg Chest Port 1 View  12/24/2014   CLINICAL DATA:  sob onset today with a dry cough x 2 days and intermittent chest pain and palpitations x 1 week. States she also has had right foot swelling x 2 weeks. Hx of lung cancer, brain cancer, COPD, HTN and radiation therapy  EXAM: PORTABLE CHEST - 1 VIEW  COMPARISON:  CT 12/05/2014 and earlier studies  FINDINGS: Elevated left diaphragmatic leaflet. Consolidation/atelectasis in the left mid and lower lung. Right lung clear. Heart size difficult to assess due to adjacent opacity and leftward mediastinal shift. Chronic blunting of the left lateral costophrenic angle. No pneumothorax. Visualized skeletal structures are unremarkable.  IMPRESSION: 1. Left volume loss and parenchymal changes as before, without acute or superimposed abnormality.   Electronically Signed   By: Lucrezia Europe M.D.    On: 12/24/2014 18:49    EKG: Independently reviewed.   Assessment/Plan Principal Problem:   Pulmonary embolism Active Problems:   Hypertension   Primary cancer of left lower lobe of lung   Hypercholesteremia   Solitary Left Frontal 3.4 cm Brain Metastasis   Metastasis to brain   GERD (gastroesophageal reflux disease)   COPD (chronic obstructive pulmonary disease)   Abnormal liver function  PE:  patient was found to have oxygen desaturation to 87% and pulmonary embolism by CT angiogram, which cannot rule out right heart straining. Currently patient is hemodynamically stable.  -  admitted to SDU for close monitoring overnight -heparin drip initiated in Ed -2D echocardiogram ordered -LE dopplers ordered to evaluate for DVT -pain control: Morphine for severe pain and oxycodone for more current pain -Tussionex cough  Possible sepsis? Patient had tachycardia and low blood pressure in ED. Thought it can be explained by PE, patient also has runny nose and fever, indicating possible viral or bacterial infection. CT angiogram showed mild progression of consolidation in left lung since prior study. Patient was started with the vancomycin and aztreonam by ED, and received 2 L normal saline bolus. Her blood pressure was stabilized. - Continue IV vancomycin and aztreonam - Urine legionella and S. pneumococcal antigen - IVF: 125 cc/h - Albuterol Nebulizer prn for SOB - Follow up blood culture x2, sputum culture, respiratory virus panel, Flu pcr - get Procalcitonin and trend lactic acid level  COPD: Patient does not have signs of acute exacerbation. -Albuterol nebulizers when necessary  Lung cancer and metastatic disease to brine: Non-small cell lung cancer, diagnosed on 02/2013. Metastasized to the brain (s/p of surgery). She is s/p of chemotherapy and radiation therapy. Patient has been followed up by Dr. Julien Nordmann. Last visit was 2 weeks ago, and was told to be stable.  -follow up with Dr.  Julien Nordmann  GERD: -Protonix  Abnormal liver function: AST 21, ALT 81, total bilirubin 1.4. Patient does not drink alcohol. Etiology is not clear. Differential diagnoses include metastatic disease, hepatitis or biliary system disease. -check hepatitis panel -get direct bilirubin -avoid Tylenol   DVT ppx: on IV Heparin    Code Status: Full code Family Communication:   Yes, patient's  2 daughters     at bed side Disposition Plan: Admit to inpatient   Date of Service 12/25/2014    Ivor Costa [facility] Pager [phone]  If 7PM-7AM, please contact night-coverage [email or web address] Password [email or web address] 12/25/2014, 5:04 AM

## 2014-12-25 NOTE — Progress Notes (Signed)
Patient's temperature was 100.90F.  PCP on call was notified.

## 2014-12-25 NOTE — Progress Notes (Signed)
ANTIBIOTIC CONSULT NOTE - INITIAL  Pharmacy Consult for vancomycin, aztreonam Indication: HCAP  Allergies  Allergen Reactions  . Penicillins Swelling    Patient Measurements: Height: _0  (154.9 cm) Weight: 122 lb 12.7 oz (55.7 kg) IBW/kg (Calculated) : 47.8 Adjusted Body Weight:   Vital Signs: Temp: 97.6 F (36.4 C) (03/09 0059) Temp Source: Oral (03/09 0059) BP: 107/57 mmHg (03/09 0059) Pulse Rate: 94 (03/09 0059) Intake/Output from previous day:   Intake/Output from this shift:    Labs:  Recent Labs  12/24/14 1915  WBC 4.6  HGB 11.2*  PLT 175  CREATININE 0.53   Estimated Creatinine Clearance: 45.8 mL/min (by C-G formula based on Cr of 0.53). No results for input(s): VANCOTROUGH, VANCOPEAK, VANCORANDOM, GENTTROUGH, GENTPEAK, GENTRANDOM, TOBRATROUGH, TOBRAPEAK, TOBRARND, AMIKACINPEAK, AMIKACINTROU, AMIKACIN in the last 72 hours.   Microbiology: Recent Results (from the past 720 hour(s))  TECHNOLOGIST REVIEW     Status: None   Collection Time: 12/05/14  3:24 PM  Result Value Ref Range Status   Technologist Review 1% myelocyte  Final    Medical History: Past Medical History  Diagnosis Date  . Nodule of left lung   . Hypertension   . Nicotine addiction   . Osteopenia   . Osteomalacia   . Impaired fasting glucose   . Hypercholesteremia   . Depression   . COPD (chronic obstructive pulmonary disease)   . GERD (gastroesophageal reflux disease)   . Lung cancer dx'd 02/2013  . 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    Medications:  Anti-infectives    Start     Dose/Rate Route Frequency Ordered Stop   12/25/14 1400  aztreonam (AZACTAM) 1 g in dextrose 5 % 50 mL IVPB     1 g 100 mL/hr over 30 Minutes Intravenous 3 times per day 12/25/14 0238     12/25/14 1200  vancomycin (VANCOCIN) 500 mg in sodium chloride 0.9 % 100 mL IVPB     500 mg 100 mL/hr over 60 Minutes Intravenous Every 12 hours 12/25/14 0238     12/25/14  0600  aztreonam (AZACTAM) injection 1 g  Status:  Discontinued     1 g Intramuscular 3 times per day 12/25/14 0155 12/25/14 0226   12/25/14 0200  vancomycin (VANCOCIN) IVPB 750 mg/150 ml premix  Status:  Discontinued     750 mg 150 mL/hr over 60 Minutes Intravenous Every 8 hours 12/25/14 0155 12/25/14 0212   12/24/14 2315  aztreonam (AZACTAM) 2 g in dextrose 5 % 50 mL IVPB     2 g 100 mL/hr over 30 Minutes Intravenous  Once 12/24/14 2308     12/24/14 2315  vancomycin (VANCOCIN) IVPB 1000 mg/200 mL premix     1,000 mg 200 mL/hr over 60 Minutes Intravenous  Once 12/24/14 2308 12/25/14 0050     Assessment: Patient with new HCAP and PE.  PMHx: lung CA, brain CA, COPD, HTN, and HLD who presents to the Emergency Department complaining of SOB with onset today.  First dose of vancomcyin already given   Goal of Therapy:  Vancomycin trough level 15-20 mcg/ml  Aztreonam dosed based on patient weight and renal function   Plan:  Measure antibiotic drug levels at steady state Follow up culture results Vancomycin 556m iv q12hr  Aztreonam 1gm iv q8hr  GTyler Deis Dosha Broshears [place] 12/25/2014,2:42 AM

## 2014-12-25 NOTE — Progress Notes (Signed)
PATIENT DETAILS Name: Chelsea Cruz Age: 74 y.o. Sex: female Date of Birth: 26-Jun-1941 Admit Date: 12/24/2014 Admitting Physician Ivor Costa, MD AOZ:[id number],IONGE, MD  Subjective: Feels better.   Assessment/Plan: Principal Problem:   Pulmonary embolism: Admitted and started on intravenous heparin infusion. Discussed with Dr. Vertell Limber, since patient 2 months hour of craniotomy, okay to be anticoagulated long term with oral novel agents. Discussed case with Dr. Julien Nordmann, he recommends Lovenox long-term given active cancer. We plan on stopping heparin and starting Lovenox. Begin Lovenox teaching. We will have case management evaluate regarding arranging outpatient Lovenox.  Active Problems:    Sepsis: likely secondary to post obstructive PNA. Await cultures, continue with vancomycin and aztreonam. Will transition to oral agents on discharge. Just completed a Decadron taper this past Monday, she was briefly hypotensive overnight and responded to fluids. We will continue to monitor, if hypotension recurs, she may need to be restarted on steroids and tapered off again.     Suspected postobstructive pneumonia: Antibiotics as above. Follow clinically.     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 Rogue Jury, see above. Continue Keppra for seizure prophylaxis     Brief run of SVT overnight: Monitor in telemetry. Blood pressure soft, when able we will start low-dose beta blocker. Check echocardiogram     COPD: Appears stable. Lungs basically clear. Continue with as needed bronchodilators     GERD: Continue PPI.     Mildly elevated liver function: Follow periodically.    Disposition: Remain inpatient  Antibiotics:  See below   Anti-infectives    Start     Dose/Rate Route Frequency Ordered Stop   12/25/14 1400  aztreonam (AZACTAM) 1 g in dextrose 5 % 50 mL IVPB     1 g 100 mL/hr over 30 Minutes Intravenous 3  times per day 12/25/14 0238     12/25/14 1200  vancomycin (VANCOCIN) 500 mg in sodium chloride 0.9 % 100 mL IVPB     500 mg 100 mL/hr over 60 Minutes Intravenous Every 12 hours 12/25/14 0238     12/25/14 0600  aztreonam (AZACTAM) injection 1 g  Status:  Discontinued     1 g Intramuscular 3 times per day 12/25/14 0155 12/25/14 0226   12/25/14 0200  vancomycin (VANCOCIN) IVPB 750 mg/150 ml premix  Status:  Discontinued     750 mg 150 mL/hr over 60 Minutes Intravenous Every 8 hours 12/25/14 0155 12/25/14 0212   12/24/14 2315  aztreonam (AZACTAM) 2 g in dextrose 5 % 50 mL IVPB     2 g 100 mL/hr over 30 Minutes Intravenous  Once 12/24/14 2308 12/25/14 0330   12/24/14 2315  vancomycin (VANCOCIN) IVPB 1000 mg/200 mL premix     1,000 mg 200 mL/hr over 60 Minutes Intravenous  Once 12/24/14 2308 12/25/14 0050      DVT Prophylaxis: On therapeutic Lovenox  Code Status: Full code   Family Communication Daughter at bedside  Procedures:  None  CONSULTS:  None  Time spent 40 minutes-which includes 50% of the time with face-to-face with patient/ family and coordinating care related to the above assessment and plan.  MEDICATIONS: Scheduled Meds: . antiseptic oral rinse  7 mL Mouth Rinse BID  . aztreonam  1 g Intravenous 3 times per day  . cholecalciferol  400 Units Oral Daily  . levETIRAcetam  500 mg Oral BID  . pantoprazole  40 mg Oral QHS  .  vancomycin  500 mg Intravenous Q12H   Continuous Infusions: . sodium chloride 75 mL/hr (12/25/14 1019)  . heparin 900 Units/hr (12/25/14 1020)   PRN Meds:.albuterol, calcium carbonate, chlorpheniramine-HYDROcodone, morphine injection, oxyCODONE    PHYSICAL EXAM: Vital signs in last 24 hours: Filed Vitals:   12/24/14 2330 12/25/14 0000 12/25/14 0059 12/25/14 0434  BP: 108/48 105/51 107/57 117/50  Pulse: 96 75 94 104  Temp:   97.6 F (36.4 C) 98.2 F (36.8 C)  TempSrc:   Oral Oral  Resp: 24 22 20 20   Height:   5\' 1"  (1.549 m)     Weight:   55.7 kg (122 lb 12.7 oz)   SpO2: 95% 95% 100% 95%    Weight change:  Filed Weights   12/24/14 1804 12/25/14 0059  Weight: 56.7 kg (125 lb) 55.7 kg (122 lb 12.7 oz)   Body mass index is 23.21 kg/(m^2).   Gen Exam: Awake and alert with clear speech.   Neck: Supple, No JVD.   Chest: B/L Clear.   CVS: S1 S2 Regular, no murmurs.  Abdomen: soft, BS +, non tender, non distended.  Extremities: no edema, lower extremities warm to touch. Neurologic: Non Focal.   Skin: No Rash.   Wounds: N/A.    Intake/Output from previous day:  Intake/Output Summary (Last 24 hours) at 12/25/14 1219 Last data filed at 12/25/14 1101  Gross per 24 hour  Intake 443.75 ml  Output    925 ml  Net -481.25 ml     LAB RESULTS: CBC  Recent Labs Lab 12/24/14 1915 12/25/14 0424  WBC 4.6 4.6  HGB 11.2* 8.8*  HCT 35.2* 27.8*  PLT 175 148*  MCV 89.1 89.4  MCH 28.4 28.3  MCHC 31.8 31.7  RDW 19.9* 19.2*  LYMPHSABS 1.9  --   MONOABS 0.3  --   EOSABS 0.0  --   BASOSABS 0.0  --     Chemistries   Recent Labs Lab 12/24/14 1915  NA 137  K 3.9  CL 106  CO2 28  GLUCOSE 126*  BUN 22  CREATININE 0.53  CALCIUM 8.7    CBG: No results for input(s): GLUCAP in the last 168 hours.  GFR Estimated Creatinine Clearance: 45.8 mL/min (by C-G formula based on Cr of 0.53).  Coagulation profile  Recent Labs Lab 12/25/14 0236  INR 1.27    Cardiac Enzymes  Recent Labs Lab 12/24/14 1915  TROPONINI 0.03    Invalid input(s): POCBNP No results for input(s): DDIMER in the last 72 hours. No results for input(s): HGBA1C in the last 72 hours. No results for input(s): CHOL, HDL, LDLCALC, TRIG, CHOLHDL, LDLDIRECT in the last 72 hours. No results for input(s): TSH, T4TOTAL, T3FREE, THYROIDAB in the last 72 hours.  Invalid input(s): FREET3 No results for input(s): VITAMINB12, FOLATE, FERRITIN, TIBC, IRON, RETICCTPCT in the last 72 hours. No results for input(s): LIPASE, AMYLASE in the last  72 hours.  Urine Studies No results for input(s): UHGB, CRYS in the last 72 hours.  Invalid input(s): UACOL, UAPR, USPG, UPH, UTP, UGL, UKET, UBIL, UNIT, UROB, ULEU, UEPI, UWBC, URBC, UBAC, CAST, UCOM, BILUA  MICROBIOLOGY: No results found for this or any previous visit (from the past 240 hour(s)).  RADIOLOGY STUDIES/RESULTS: Ct Chest W Contrast  12/06/2014   CLINICAL DATA:  Subsequent treatment strategy for lung cancer diagnosed in December 2014. Pre metastasis.  EXAM: CT CHEST WITH CONTRAST  TECHNIQUE: Multidetector CT imaging of the chest was performed during intravenous contrast administration.  CONTRAST:  10mL OMNIPAQUE IOHEXOL 300 MG/ML  SOLN  COMPARISON:  CT 07/31/2014, PET-CT 03/16/2013  FINDINGS: Mediastinum/Nodes: No axillary or supraclavicular lymphadenopathy. No mediastinal lymphadenopathy. There is left peribronchial thickening (image 23, series 2). The thickening posterior to lingula bronchus measures 9 mm compared to 11 mm on prior.  Lungs/Pleura: Perihilar masslike thickening surrounds the left upper lobe bronchus (same lesion as above) and measure approximately 22 x 20 mm on lung window series Image 22. This is also similar to prior where lesion measured 25 x 24 mm on image 23, series 5, 07/31/2014  There is volume loss in the left hemi thorax. There is perihilar consolidation and bronchiectasis in the left lower lobe which is slightly improved compared prior. Interval improvement in the right left pleural effusion compared to prior.  No pulmonary nodules on the right  Upper abdomen: Thickening of the adrenal glands is unchanged comparison exam. Adrenal thickening was not hypermetabolic comparison PET-CT scan.  Musculoskeletal: No aggressive osseous lesion.  IMPRESSION: 1. No evidence of lung cancer progression. 2. Left perihilar masslike soft tissue thickening surrounding the central upper lobe bronchi is not changed. 3. Interval improvement in the left lower lobe consolidation and  pleural fluid.   Electronically Signed   By: Suzy Bouchard M.D.   On: 12/06/2014 08:30   Ct Angio Chest Pe W/cm &/or Wo Cm  12/24/2014   CLINICAL DATA:  Palpitations since yesterday. Shortness of breath. Left lung cancer about 20 months ago with radiation and chemotherapy.  EXAM: CT ANGIOGRAPHY CHEST WITH CONTRAST  TECHNIQUE: Multidetector CT imaging of the chest was performed using the standard protocol during bolus administration of intravenous contrast. Multiplanar CT image reconstructions and MIPs were obtained to evaluate the vascular anatomy.  CONTRAST:  77mL OMNIPAQUE IOHEXOL 350 MG/ML SOLN  COMPARISON:  12/05/2014  FINDINGS: Technically adequate study with good opacification of the central and segmental pulmonary arteries. Filling defects are demonstrated in the baby right upper lobe anterior segmental pulmonary artery consistent with focal acute pulmonary embolus. This is new since the previous study. No large central pulmonary emboli. Ventricular measurements are limited but RV to LV ratio appears to be increased at 1.2. This may be artifactual due to motion artifact but right heart strain is not excluded.  Normal heart size. Normal caliber thoracic aorta. No aortic dissection. Great vessel origins are patent. Calcification in the coronary arteries and aorta. Esophagus is decompressed.  There is diffuse scarring, consolidation, and volume loss in the left lung with masslike infiltration in the left hilum. This corresponds to the patient's numb carcinoma and likely represents combination of mass and postobstructive change with radiation changes. Degree of consolidation is slightly more prominent than on the prior study. Diffuse emphysematous changes throughout the aerated portions of both lungs. No pleural effusions. No pneumothorax.  Included portions of the upper abdominal organs are grossly unremarkable. Degenerative changes in the spine. Old left rib fractures. No destructive bone lesions.  Review  of the MIP images confirms the above findings.  IMPRESSION: Positive examination for pulmonary embolus involving right upper lung segmental pulmonary artery. Heartbeat LV ratio is increased. This may be artifactual but right heart strain is not excluded. Left hilar mass with postobstructive change and radiation change. Mild progression of consolidation since prior study.  These results were called by telephone at the time of interpretation on 12/24/2014 at 9:24 pm to Dr. Sherwood Gambler , who verbally acknowledged these results.   Electronically Signed   By: Oren Beckmann.D.  On: 12/24/2014 21:26   Dg Chest Port 1 View  12/24/2014   CLINICAL DATA:  sob onset today with a dry cough x 2 days and intermittent chest pain and palpitations x 1 week. States she also has had right foot swelling x 2 weeks. Hx of lung cancer, brain cancer, COPD, HTN and radiation therapy  EXAM: PORTABLE CHEST - 1 VIEW  COMPARISON:  CT 12/05/2014 and earlier studies  FINDINGS: Elevated left diaphragmatic leaflet. Consolidation/atelectasis in the left mid and lower lung. Right lung clear. Heart size difficult to assess due to adjacent opacity and leftward mediastinal shift. Chronic blunting of the left lateral costophrenic angle. No pneumothorax. Visualized skeletal structures are unremarkable.  IMPRESSION: 1. Left volume loss and parenchymal changes as before, without acute or superimposed abnormality.   Electronically Signed   By: Lucrezia Europe M.D.   On: 12/24/2014 18:49    Oren Binet, MD  [facility] Pager:[phone]  If 7PM-7AM, please contact night-coverage [email or web address] Password [email or web address] 12/25/2014, 12:19 PM   LOS: 1 day

## 2014-12-25 NOTE — Progress Notes (Signed)
PT Cancellation Note  Patient Details Name: Chelsea Cruz MRN: [id number] DOB: [birth year]   Cancelled Treatment:    Reason Eval/Treat Not Completed: Medical issues which prohibited therapy (will await 24 hrs on heparin and therapeutic level prior to mobilizing)   [name],KATHrine E 12/25/2014, 8:32 AM Carmelia Bake, PT, DPT 12/25/2014 Pager: [phone]

## 2014-12-25 NOTE — Progress Notes (Addendum)
*  PRELIMINARY RESULTS* Vascular Ultrasound Bilateral lower extremity venous duplex has been completed.  Preliminary findings: Bilateral:  No evidence of DVT, superficial thrombosis, or Baker's Cyst.    Chelsea Cruz FRANCES 12/25/2014, 4:22 PM

## 2014-12-25 NOTE — Care Management Note (Addendum)
    Page 1 of 2   12/31/2014     11:06:01 AM CARE MANAGEMENT NOTE 12/31/2014  Patient:  Chelsea Cruz, Chelsea Cruz   Account Number:  [id number]  Date Initiated:  12/25/2014  Documentation initiated by:  Dessa Phi  Subjective/Objective Assessment:   74 y/o f admitted w/PE.     Action/Plan:   From home.active w[facility]-HHRN/PT   Anticipated DC Date:  12/31/2014   Anticipated DC Plan:  [place]  CM consult      Choice offered to / List presented to:             Status of service:  Completed, signed off Medicare Important Message given?  YES (If response is "NO", the following Medicare IM given date fields will be blank) Date Medicare IM given:  12/27/2014 Medicare IM given by:  [facility] Date Additional Medicare IM given:  12/30/2014 Additional Medicare IM given by:  [facility]  Discharge Disposition:  [place]  Per UR Regulation:  Reviewed for med. necessity/level of care/duration of stay  If discussed at [place] of Stay Meetings, dates discussed:   12/31/2014    Comments:  12/31/14 Dessa Phi RN BSN NCM [phone] d/c order. d/c plan snf.  12/30/14 Dessa Phi RN BSN NCM [phone] d/c cancelled due to cxr results-r lung increase edema.D/C plan SNF when ready.  12/27/14 Dessa Phi RN BSN NCM [phone] PT-SNF. CSW following for SNF.  OT-SNF.Per MD patient thinks SNF more appropriate.PT-re eval.Spoke to patient/dtr-prefer [facility].CSW already following.  12/26/14 Dessa Phi RN BSN NCM [phone] Benefits checked for enoxarparin-$6 co pay.Lovenox not covered, & requires prior auth.Await PT recommendations.Patient gave permission to talk to dtr-Peggy Azerba[place] who says patient lives w/her @ [address], [place] [phone].  12/25/14 Dessa Phi RN BSN NCM [phone] No anticipated d/c needs.

## 2014-12-25 NOTE — Progress Notes (Addendum)
ANTICOAGULATION CONSULT NOTE - Follow-up  Pharmacy Consult for heparin --> enoxaparin Indication: pulmonary embolus  Allergies  Allergen Reactions  . Penicillins Swelling    Patient Measurements: Height: _0  (154.9 cm) Weight: 122 lb 12.7 oz (55.7 kg) IBW/kg (Calculated) : 47.8 Heparin Dosing Weight: 56.7kg  Vital Signs: Temp: 98.2 F (36.8 C) (03/09 0434) Temp Source: Oral (03/09 0434) BP: 117/50 mmHg (03/09 0434) Pulse Rate: 104 (03/09 0434)  Labs:  Recent Labs  12/24/14 1915 12/25/14 0236 12/25/14 0424 12/25/14 0823  HGB 11.2*  --  8.8*  --   HCT 35.2*  --  27.8*  --   PLT 175  --  148*  --   APTT  --  138*  --   --   LABPROT  --  16.0*  --   --   INR  --  1.27  --   --   HEPARINUNFRC  --   --   --  0.75*  CREATININE 0.53  --   --   --   TROPONINI 0.03  --   --   --     Estimated Creatinine Clearance: 45.8 mL/min (by C-G formula based on Cr of 0.53).   Medical History: Past Medical History  Diagnosis Date  . Nodule of left lung   . Hypertension   . Nicotine addiction   . Osteopenia   . Osteomalacia   . Impaired fasting glucose   . Hypercholesteremia   . Depression   . COPD (chronic obstructive pulmonary disease)   . GERD (gastroesophageal reflux disease)   . Lung cancer dx'd 02/2013  . 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 sodium chloride 125 mL/hr at 12/25/14 0227  . heparin 950 Units/hr (12/24/14 2237)    Assessment: 60 yof presented to the ED with SOB and cough. Found to have a RUL PE. To start IV heparin for anticoagulation. Baseline H/H is slightly low but platelets are WNL.   Today, 12/25/2014:  Heparin level = 0.75 (SUPRAtherapeutic) on 950 units/hr  CBC: Hgb = 8.8, pltc = 148 (decreased) - suspect d/t hemo-dilution following NS bolus x 2  No obvious bleeding  Goal of Therapy:  Heparin level 0.3-0.7 units/ml Monitor platelets by anticoagulation protocol: Yes    Plan:   Reduce heparin to 900 units/hr for supratherapeutic level  Re-Check 8 hour heparin level  Daily heparin level and CBC  F/u plans for long-term anticoagulation - with active solid tumor malignancy, LMWH may be preferred if affordable, etc  Monitor for bleeding - watch Hgb and pltc trend (recheck CBC with next heparin level)  Doreene Eland, PharmD, BCPS.   Pager: [phone]  12/25/2014,10:03 AM  Addendum:  Orders to change to enoxaparin as long-term anticoagulation Today's wt = 55.7kg  Plan:  D/C heparin at 3pm, start enoxaparin 1.42m/kg (872m SQ q24h  Use 24h dosing as daughter will have to give injection.    Lovenox teaching kit  Change CBC to q72h starting in am  Monitor for bleeding, renal function, and weight  Follow-up insurance coverage of LMGlen FloraPharmD, BCPS.   Pager: [phone]/06/2015 12:43 PM

## 2014-12-26 DIAGNOSIS — I2699 Other pulmonary embolism without acute cor pulmonale: Secondary | ICD-10-CM

## 2014-12-26 DIAGNOSIS — J411 Mucopurulent chronic bronchitis: Secondary | ICD-10-CM

## 2014-12-26 DIAGNOSIS — J189 Pneumonia, unspecified organism: Secondary | ICD-10-CM

## 2014-12-26 LAB — CBC
HCT: 22.3 % — ABNORMAL LOW (ref 36.0–46.0)
HEMATOCRIT: 36 % (ref 36.0–46.0)
HEMOGLOBIN: 11.7 g/dL — AB (ref 12.0–15.0)
Hemoglobin: 7 g/dL — ABNORMAL LOW (ref 12.0–15.0)
MCH: 28 pg (ref 26.0–34.0)
MCH: 29.5 pg (ref 26.0–34.0)
MCHC: 31.4 g/dL (ref 30.0–36.0)
MCHC: 32.5 g/dL (ref 30.0–36.0)
MCV: 89.2 fL (ref 78.0–100.0)
MCV: 90.7 fL (ref 78.0–100.0)
PLATELETS: 153 10*3/uL (ref 150–400)
PLATELETS: 170 10*3/uL (ref 150–400)
RBC: 2.5 MIL/uL — AB (ref 3.87–5.11)
RBC: 3.97 MIL/uL (ref 3.87–5.11)
RDW: 18.9 % — ABNORMAL HIGH (ref 11.5–15.5)
RDW: 17.2 % — ABNORMAL HIGH (ref 11.5–15.5)
WBC: 2.9 10*3/uL — ABNORMAL LOW (ref 4.0–10.5)
WBC: 4.6 10*3/uL (ref 4.0–10.5)

## 2014-12-26 LAB — BASIC METABOLIC PANEL
ANION GAP: 4 — AB (ref 5–15)
BUN: 9 mg/dL (ref 6–23)
CALCIUM: 8.1 mg/dL — AB (ref 8.4–10.5)
CO2: 29 mmol/L (ref 19–32)
Chloride: 106 mmol/L (ref 96–112)
Creatinine, Ser: 0.44 mg/dL — ABNORMAL LOW (ref 0.50–1.10)
Glucose, Bld: 91 mg/dL (ref 70–99)
Potassium: 3.1 mmol/L — ABNORMAL LOW (ref 3.5–5.1)
Sodium: 139 mmol/L (ref 135–145)

## 2014-12-26 LAB — IRON AND TIBC
Iron: 38 ug/dL — ABNORMAL LOW (ref 42–145)
SATURATION RATIOS: 20 % (ref 20–55)
TIBC: 189 ug/dL — ABNORMAL LOW (ref 250–470)
UIBC: 151 ug/dL (ref 125–400)

## 2014-12-26 LAB — LEGIONELLA ANTIGEN, URINE

## 2014-12-26 LAB — PREPARE RBC (CROSSMATCH)

## 2014-12-26 LAB — FERRITIN: FERRITIN: 424 ng/mL — AB (ref 10–291)

## 2014-12-26 LAB — MAGNESIUM: Magnesium: 1.6 mg/dL (ref 1.5–2.5)

## 2014-12-26 LAB — RETICULOCYTES
RBC.: 2.6 MIL/uL — ABNORMAL LOW (ref 3.87–5.11)
RETIC CT PCT: 3.5 % — AB (ref 0.4–3.1)
Retic Count, Absolute: 91 10*3/uL (ref 19.0–186.0)

## 2014-12-26 LAB — ABO/RH: ABO/RH(D): A NEG

## 2014-12-26 LAB — VITAMIN B12: VITAMIN B 12: 115 pg/mL — AB (ref 211–911)

## 2014-12-26 LAB — FOLATE: Folate: 19.2 ng/mL

## 2014-12-26 MED ORDER — POTASSIUM CHLORIDE CRYS ER 20 MEQ PO TBCR
40.0000 meq | EXTENDED_RELEASE_TABLET | Freq: Once | ORAL | Status: AC
  Administered 2014-12-26: 40 meq via ORAL
  Filled 2014-12-26: qty 2

## 2014-12-26 MED ORDER — METOPROLOL TARTRATE 25 MG PO TABS: 25.0000 mg | ORAL_TABLET | Freq: Two times a day (BID) | ORAL | Status: DC

## 2014-12-26 MED ORDER — DIPHENHYDRAMINE HCL 50 MG/ML IJ SOLN
25.0000 mg | Freq: Once | INTRAMUSCULAR | Status: AC
Start: 2014-12-26 — End: 2014-12-26
  Administered 2014-12-26: 25 mg via INTRAVENOUS
  Filled 2014-12-26: qty 1

## 2014-12-26 MED ORDER — ACETAMINOPHEN 325 MG PO TABS
650.0000 mg | ORAL_TABLET | Freq: Once | ORAL | Status: AC
  Administered 2014-12-26: 650 mg via ORAL
  Filled 2014-12-26: qty 2

## 2014-12-26 MED ORDER — FUROSEMIDE 10 MG/ML IJ SOLN
20.0000 mg | Freq: Once | INTRAMUSCULAR | Status: AC
  Administered 2014-12-26: 20 mg via INTRAVENOUS
  Filled 2014-12-26: qty 2

## 2014-12-26 MED ORDER — METOPROLOL TARTRATE 25 MG PO TABS
12.5000 mg | ORAL_TABLET | Freq: Two times a day (BID) | ORAL | Status: DC
  Administered 2014-12-26 – 2014-12-31 (×11): 12.5 mg via ORAL
  Filled 2014-12-26 (×11): qty 1

## 2014-12-26 MED ORDER — SODIUM CHLORIDE 0.9 % IV SOLN
Freq: Once | INTRAVENOUS | Status: AC
  Administered 2014-12-26: 12:00:00 via INTRAVENOUS

## 2014-12-26 NOTE — Progress Notes (Signed)
Patient had a short run of A.Fib.  Then returned to [facility].  PCP on call was notified.

## 2014-12-26 NOTE — Progress Notes (Signed)
Patient had a short run of SVT.

## 2014-12-26 NOTE — Progress Notes (Signed)
Patient's daughter/caregiver, Donnie Coffin, administered lovenox injection under nurse's teaching, supervision, and guidance.  Penny verbalizes and demonstrates understanding.

## 2014-12-26 NOTE — Evaluation (Addendum)
Physical Therapy Evaluation Patient Details Name: Chelsea Cruz MRN: [id number] DOB: [birth year] Today's Date: 12/26/2014   History of Present Illness  74 yo female admitted with PE. Hx of craniotomy 10/2014 with residual R sided weakness, HTN, COPD, lung caner with brain mets. osteopenia.   Clinical Impression  On eval, pt required Mod assist for mobility-able to ambulate ~25 feet with RW. Daughter present during session-discussed d/c plan and daughter states she feels family can continue to provide care-pt has good family support. Pt was participating in [place] PTA so recommend pt resume therapy services. Addendum: Family now requesting ST rehab at [facility].     Follow Up Recommendations SNF; 24 hour supervision/assist Addendum: Spoke with RN 12/27/2014-family now requesting ST rehab at [facility]. Recommendation updated to ST Rehab at [facility].     Equipment Recommendations       Recommendations for Other Services OT consult     Precautions / Restrictions Precautions Precautions: Fall Precaution Comments: droplet.  Restrictions Weight Bearing Restrictions: No      Mobility  Bed Mobility Overal bed mobility: Needs Assistance Bed Mobility: Supine to Sit     Supine to sit: HOB elevated;Mod assist     General bed mobility comments: Assist for LEs off bed and trunk to upright. Increased time. Utilized bedpad for scooting, positioning.   Transfers Overall transfer level: Needs assistance Equipment used: Rolling walker (2 wheeled) Transfers: Sit to/from Stand Sit to Stand: Mod assist         General transfer comment: Assist to rise, stabilize, control descent. VCS safety, technique, hand placement. Momentum + rocking+counting is pt preference.   Ambulation/Gait Ambulation/Gait assistance: Min assist Ambulation Distance (Feet): 25 Feet Assistive device: Rolling walker (2 wheeled) Gait Pattern/deviations: Step-through pattern;Decreased stride length     General Gait Details: Assist to  stabilize pt and maneuver safely with RW. Remained on [place] O2-2L. Dyspnea 2/4. Fatigues fairly quickly.   Stairs            Wheelchair Mobility    Modified Rankin (Stroke Patients Only)       Balance Overall balance assessment: Needs assistance         Standing balance support: Bilateral upper extremity supported;During functional activity Standing balance-Leahy Scale: Poor                               Pertinent Vitals/Pain Pain Assessment: No/denies pain    Home Living Family/patient expects to be discharged to:: Private residence Living Arrangements: Children Available Help at Discharge: Family Type of Home: House Home Access: Stairs to enter     Home Layout: One level Home Equipment: Environmental consultant - 2 wheels;Shower seat      Prior Function Level of Independence: Needs assistance   Gait / Transfers Assistance Needed: Assist to stand and ambulate with RW           Hand Dominance        Extremity/Trunk Assessment   Upper Extremity Assessment: Defer to OT evaluation           Lower Extremity Assessment: RLE deficits/detail;LLE deficits/detail RLE Deficits / Details: hip flex 3+/5, knee ext 3+/5, moves ankle well LLE Deficits / Details: hip flex 3+/5, knee ext 4/5, moves ankle well  Cervical / Trunk Assessment: Normal  Communication   Communication: No difficulties  Cognition Arousal/Alertness: Awake/alert Behavior During Therapy: WFL for tasks assessed/performed   Area of Impairment: Problem solving  Problem Solving: Requires tactile cues;Requires verbal cues;Slow processing      General Comments      Exercises  Seated hip flexion, LAQs, bil LEs, 10 reps       Assessment/Plan    PT Assessment Patient needs continued PT services  PT Diagnosis Difficulty walking;Generalized weakness   PT Problem List Decreased strength;Decreased activity tolerance;Decreased balance;Decreased mobility;Decreased knowledge of  use of DME  PT Treatment Interventions DME instruction;Gait training;Functional mobility training;Therapeutic activities;Therapeutic exercise;Patient/family education;Balance training   PT Goals (Current goals can be found in the Care Plan section) Acute Rehab PT Goals Patient Stated Goal: home. get better PT Goal Formulation: With patient/family Time For Goal Achievement: 01/09/15 Potential to Achieve Goals: Good    Frequency Min 3X/week   Barriers to discharge        Co-evaluation               End of Session Equipment Utilized During Treatment: Gait belt;Oxygen Activity Tolerance: Patient tolerated treatment well Patient left: in chair;with call [place]/phone within reach;with family/visitor present           Time: 0263-7858 PT Time Calculation (min) (ACUTE ONLY): 33 min   Charges:   PT Evaluation $Initial PT Evaluation Tier I: 1 Procedure PT Treatments $Gait Training: 8-22 mins   PT G Codes:        Weston Anna, MPT Pager: [phone]

## 2014-12-26 NOTE — Progress Notes (Signed)
OT Cancellation Note  Patient Details Name: Chelsea Cruz MRN: [id number] DOB: 19-Nov-1940   Cancelled Treatment:    Reason Eval/Treat Not Completed: Pt getting blood. RN ask OT to come back later this day or next day.  Betsy Pries 12/26/2014, 12:50 PM

## 2014-12-26 NOTE — Progress Notes (Signed)
PATIENT DETAILS Name: Chelsea Cruz Age: 74 y.o. Sex: female Date of Birth: 11/10/1940 Admit Date: 12/24/2014 Admitting Physician Ivor Costa, MD JFH:[id number],LSLHT, MD  Subjective: No complaints overnight.  Assessment/Plan: Principal Problem:   Pulmonary embolism: Admitted and started on intravenous heparin infusion. Discussed with Dr. Vertell Limber, since patient 2 months hour of craniotomy, okay to be anticoagulated long term with oral novel agents. Discussed case with Dr. Julien Nordmann, he recommends Lovenox long-term given active cancer. Subsequently started on Lovenox, await 2-D echocardiogram. Lovenox teaching started by RN.  Active Problems:    Sepsis: likely secondary to post obstructive PNA. Await cultures, continue with vancomycin and aztreonam. Will transition to oral agents on discharge. Just completed a Decadron taper this past Monday, she was briefly hypotensive on admission and responded to IV fluids. If hypotension reoccurs she may need to be started on a quick taper of steroids.     Suspected postobstructive pneumonia: Antibiotics as above. Low-grade fever overnight, but clinically improved .    Anemia: Significant drop in hemoglobin-no venous blood loss apparent. No back pain-although on anticoagulation-low suspicion for retroperitoneal hematoma at this point. Suspect that worsening anemia is likely secondary to acute illness, IV fluid dilution. Transfuse 1 unit of PRBC, and follow.     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 Julien Nordmann, see above. Continue Keppra for seizure prophylaxis    Recurrent SVT: Monitor in telemetry. Start low-dose metoprolol. Await echocardiogram     COPD: Appears stable. Lungs basically clear. Continue with as needed bronchodilators     GERD: Continue PPI.     Mildly elevated liver function: Follow periodically.    Disposition: Remain inpatient-home in 1-2  days  Antibiotics:  See below   Anti-infectives    Start     Dose/Rate Route Frequency Ordered Stop   12/25/14 1400  aztreonam (AZACTAM) 1 g in dextrose 5 % 50 mL IVPB     1 g 100 mL/hr over 30 Minutes Intravenous 3 times per day 12/25/14 0238     12/25/14 1200  vancomycin (VANCOCIN) 500 mg in sodium chloride 0.9 % 100 mL IVPB     500 mg 100 mL/hr over 60 Minutes Intravenous Every 12 hours 12/25/14 0238     12/25/14 0600  aztreonam (AZACTAM) injection 1 g  Status:  Discontinued     1 g Intramuscular 3 times per day 12/25/14 0155 12/25/14 0226   12/25/14 0200  vancomycin (VANCOCIN) IVPB 750 mg/150 ml premix  Status:  Discontinued     750 mg 150 mL/hr over 60 Minutes Intravenous Every 8 hours 12/25/14 0155 12/25/14 0212   12/24/14 2315  aztreonam (AZACTAM) 2 g in dextrose 5 % 50 mL IVPB     2 g 100 mL/hr over 30 Minutes Intravenous  Once 12/24/14 2308 12/25/14 0330   12/24/14 2315  vancomycin (VANCOCIN) IVPB 1000 mg/200 mL premix     1,000 mg 200 mL/hr over 60 Minutes Intravenous  Once 12/24/14 2308 12/25/14 0050      DVT Prophylaxis: On therapeutic Lovenox  Code Status: Full code   Family Communication Daughter at bedside  Procedures:  None  CONSULTS:  None  MEDICATIONS: Scheduled Meds: . antiseptic oral rinse  7 mL Mouth Rinse BID  . aztreonam  1 g Intravenous 3 times per day  . cholecalciferol  400 Units Oral Daily  . enoxaparin (LOVENOX) injection  80 mg Subcutaneous Q24H  . levETIRAcetam  500 mg Oral BID  . metoprolol tartrate  12.5 mg Oral BID  . pantoprazole  40 mg Oral QHS  . vancomycin  500 mg Intravenous Q12H   Continuous Infusions:   PRN Meds:.acetaminophen, albuterol, calcium carbonate, chlorpheniramine-HYDROcodone, morphine injection, oxyCODONE    PHYSICAL EXAM: Vital signs in last 24 hours: Filed Vitals:   12/26/14 0514 12/26/14 0609 12/26/14 1140 12/26/14 1225  BP:  116/50 108/53 96/44  Pulse:  96 103 83  Temp: 98.7 F (37.1 C) 98 F  (36.7 C) 98.1 F (36.7 C) 98.3 F (36.8 C)  TempSrc: Oral Oral Oral Oral  Resp:  20 18 20   Height:      Weight:      SpO2:  98% 98% 99%    Weight change:  Filed Weights   12/24/14 1804 12/25/14 0059  Weight: 56.7 kg (125 lb) 55.7 kg (122 lb 12.7 oz)   Body mass index is 23.21 kg/(m^2).   Gen Exam: Awake and alert with clear speech.   Neck: Supple, No JVD.   Chest: B/L Clear.  No rales or rhonchi CVS: S1 S2 Regular, no murmurs.  Abdomen: soft, BS +, non tender, non distended.  Extremities: no edema, lower extremities warm to touch. Neurologic: Non Focal.   Skin: No Rash.   Wounds: N/A.    Intake/Output from previous day:  Intake/Output Summary (Last 24 hours) at 12/26/14 1235 Last data filed at 12/26/14 1225  Gross per 24 hour  Intake 2044.17 ml  Output   1285 ml  Net 759.17 ml     LAB RESULTS: CBC  Recent Labs Lab 12/24/14 1915 12/25/14 0424 12/26/14 0507  WBC 4.6 4.6 2.9*  HGB 11.2* 8.8* 7.0*  HCT 35.2* 27.8* 22.3*  PLT 175 148* 153  MCV 89.1 89.4 89.2  MCH 28.4 28.3 28.0  MCHC 31.8 31.7 31.4  RDW 19.9* 19.2* 18.9*  LYMPHSABS 1.9  --   --   MONOABS 0.3  --   --   EOSABS 0.0  --   --   BASOSABS 0.0  --   --     Chemistries   Recent Labs Lab 12/24/14 1915 12/26/14 0500 12/26/14 0507  NA 137  --  139  K 3.9  --  3.1*  CL 106  --  106  CO2 28  --  29  GLUCOSE 126*  --  91  BUN 22  --  9  CREATININE 0.53  --  0.44*  CALCIUM 8.7  --  8.1*  MG  --  1.6  --     CBG: No results for input(s): GLUCAP in the last 168 hours.  GFR Estimated Creatinine Clearance: 45.8 mL/min (by C-G formula based on Cr of 0.44).  Coagulation profile  Recent Labs Lab 12/25/14 0236  INR 1.27    Cardiac Enzymes  Recent Labs Lab 12/24/14 1915  TROPONINI 0.03    Invalid input(s): POCBNP No results for input(s): DDIMER in the last 72 hours. No results for input(s): HGBA1C in the last 72 hours. No results for input(s): CHOL, HDL, LDLCALC, TRIG,  CHOLHDL, LDLDIRECT in the last 72 hours. No results for input(s): TSH, T4TOTAL, T3FREE, THYROIDAB in the last 72 hours.  Invalid input(s): FREET3  Recent Labs  12/26/14 0925  RETICCTPCT 3.5*   No results for input(s): LIPASE, AMYLASE in the last 72 hours.  Urine Studies No results for input(s): UHGB, CRYS in the last 72 hours.  Invalid input(s): UACOL, UAPR, USPG, UPH, UTP, UGL, UKET, UBIL, UNIT,  UROB, ULEU, UEPI, UWBC, URBC, UBAC, CAST, UCOM, BILUA  MICROBIOLOGY: Recent Results (from the past 240 hour(s))  Blood culture (routine x 2)     Status: None (Preliminary result)   Collection Time: 12/24/14  9:15 PM  Result Value Ref Range Status   Specimen Description BLOOD RIGHT ANTECUBITAL  Final   Special Requests BOTTLES DRAWN AEROBIC AND ANAEROBIC 5ML  Final   Culture   Final           BLOOD CULTURE RECEIVED NO GROWTH TO DATE CULTURE WILL BE HELD FOR 5 DAYS BEFORE ISSUING A FINAL NEGATIVE REPORT Performed at [facility]    Report Status PENDING  Incomplete  Blood culture (routine x 2)     Status: None (Preliminary result)   Collection Time: 12/24/14  9:40 PM  Result Value Ref Range Status   Specimen Description BLOOD RIGHT HAND  Final   Special Requests BOTTLES DRAWN AEROBIC AND ANAEROBIC 5CC   Final   Culture   Final           BLOOD CULTURE RECEIVED NO GROWTH TO DATE CULTURE WILL BE HELD FOR 5 DAYS BEFORE ISSUING A FINAL NEGATIVE REPORT Note: Culture results may be compromised due to an inadequate volume of blood received in culture bottles. Performed at [facility]    Report Status PENDING  Incomplete    RADIOLOGY STUDIES/RESULTS: Ct Chest W Contrast  12/06/2014   CLINICAL DATA:  Subsequent treatment strategy for lung cancer diagnosed in December 2014. Pre metastasis.  EXAM: CT CHEST WITH CONTRAST  TECHNIQUE: Multidetector CT imaging of the chest was performed during intravenous contrast administration.  CONTRAST:  58mL OMNIPAQUE IOHEXOL 300 MG/ML  SOLN   COMPARISON:  CT 07/31/2014, PET-CT 03/16/2013  FINDINGS: Mediastinum/Nodes: No axillary or supraclavicular lymphadenopathy. No mediastinal lymphadenopathy. There is left peribronchial thickening (image 23, series 2). The thickening posterior to lingula bronchus measures 9 mm compared to 11 mm on prior.  Lungs/Pleura: Perihilar masslike thickening surrounds the left upper lobe bronchus (same lesion as above) and measure approximately 22 x 20 mm on lung window series Image 22. This is also similar to prior where lesion measured 25 x 24 mm on image 23, series 5, 07/31/2014  There is volume loss in the left hemi thorax. There is perihilar consolidation and bronchiectasis in the left lower lobe which is slightly improved compared prior. Interval improvement in the right left pleural effusion compared to prior.  No pulmonary nodules on the right  Upper abdomen: Thickening of the adrenal glands is unchanged comparison exam. Adrenal thickening was not hypermetabolic comparison PET-CT scan.  Musculoskeletal: No aggressive osseous lesion.  IMPRESSION: 1. No evidence of lung cancer progression. 2. Left perihilar masslike soft tissue thickening surrounding the central upper lobe bronchi is not changed. 3. Interval improvement in the left lower lobe consolidation and pleural fluid.   Electronically Signed   By: Suzy Bouchard M.D.   On: 12/06/2014 08:30   Ct Angio Chest Pe W/cm &/or Wo Cm  12/24/2014   CLINICAL DATA:  Palpitations since yesterday. Shortness of breath. Left lung cancer about 20 months ago with radiation and chemotherapy.  EXAM: CT ANGIOGRAPHY CHEST WITH CONTRAST  TECHNIQUE: Multidetector CT imaging of the chest was performed using the standard protocol during bolus administration of intravenous contrast. Multiplanar CT image reconstructions and MIPs were obtained to evaluate the vascular anatomy.  CONTRAST:  19mL OMNIPAQUE IOHEXOL 350 MG/ML SOLN  COMPARISON:  12/05/2014  FINDINGS: Technically adequate study  with good opacification  of the central and segmental pulmonary arteries. Filling defects are demonstrated in the baby right upper lobe anterior segmental pulmonary artery consistent with focal acute pulmonary embolus. This is new since the previous study. No large central pulmonary emboli. Ventricular measurements are limited but RV to LV ratio appears to be increased at 1.2. This may be artifactual due to motion artifact but right heart strain is not excluded.  Normal heart size. Normal caliber thoracic aorta. No aortic dissection. Great vessel origins are patent. Calcification in the coronary arteries and aorta. Esophagus is decompressed.  There is diffuse scarring, consolidation, and volume loss in the left lung with masslike infiltration in the left hilum. This corresponds to the patient's numb carcinoma and likely represents combination of mass and postobstructive change with radiation changes. Degree of consolidation is slightly more prominent than on the prior study. Diffuse emphysematous changes throughout the aerated portions of both lungs. No pleural effusions. No pneumothorax.  Included portions of the upper abdominal organs are grossly unremarkable. Degenerative changes in the spine. Old left rib fractures. No destructive bone lesions.  Review of the MIP images confirms the above findings.  IMPRESSION: Positive examination for pulmonary embolus involving right upper lung segmental pulmonary artery. Heartbeat LV ratio is increased. This may be artifactual but right heart strain is not excluded. Left hilar mass with postobstructive change and radiation change. Mild progression of consolidation since prior study.  These results were called by telephone at the time of interpretation on 12/24/2014 at 9:24 pm to Dr. Sherwood Gambler , who verbally acknowledged these results.   Electronically Signed   By: Lucienne Capers M.D.   On: 12/24/2014 21:26   Dg Chest Port 1 View  12/24/2014   CLINICAL DATA:  sob onset  today with a dry cough x 2 days and intermittent chest pain and palpitations x 1 week. States she also has had right foot swelling x 2 weeks. Hx of lung cancer, brain cancer, COPD, HTN and radiation therapy  EXAM: PORTABLE CHEST - 1 VIEW  COMPARISON:  CT 12/05/2014 and earlier studies  FINDINGS: Elevated left diaphragmatic leaflet. Consolidation/atelectasis in the left mid and lower lung. Right lung clear. Heart size difficult to assess due to adjacent opacity and leftward mediastinal shift. Chronic blunting of the left lateral costophrenic angle. No pneumothorax. Visualized skeletal structures are unremarkable.  IMPRESSION: 1. Left volume loss and parenchymal changes as before, without acute or superimposed abnormality.   Electronically Signed   By: Lucrezia Europe M.D.   On: 12/24/2014 18:49    Oren Binet, MD  [facility] Pager:[phone]  If 7PM-7AM, please contact night-coverage [email or web address] Password [email or web address] 12/26/2014, 12:35 PM   LOS: 2 days

## 2014-12-26 NOTE — Progress Notes (Signed)
Echocardiogram 2D Echocardiogram has been performed.  Chelsea Cruz 12/26/2014, 12:12 PM

## 2014-12-27 DIAGNOSIS — K7689 Other specified diseases of liver: Secondary | ICD-10-CM

## 2014-12-27 DIAGNOSIS — J42 Unspecified chronic bronchitis: Secondary | ICD-10-CM

## 2014-12-27 LAB — CBC
HCT: 35.3 % — ABNORMAL LOW (ref 36.0–46.0)
Hemoglobin: 11.4 g/dL — ABNORMAL LOW (ref 12.0–15.0)
MCH: 29.1 pg (ref 26.0–34.0)
MCHC: 32.3 g/dL (ref 30.0–36.0)
MCV: 90.1 fL (ref 78.0–100.0)
PLATELETS: 169 10*3/uL (ref 150–400)
RBC: 3.92 MIL/uL (ref 3.87–5.11)
RDW: 17.6 % — AB (ref 11.5–15.5)
WBC: 3.9 10*3/uL — ABNORMAL LOW (ref 4.0–10.5)

## 2014-12-27 LAB — TYPE AND SCREEN
ABO/RH(D): A NEG
Antibody Screen: NEGATIVE
Unit division: 0

## 2014-12-27 LAB — RESPIRATORY VIRUS PANEL
Adenovirus: NEGATIVE
INFLUENZA B 1: POSITIVE — AB
Influenza A: POSITIVE — AB
METAPNEUMOVIRUS: NEGATIVE
PARAINFLUENZA 1 A: NEGATIVE
Parainfluenza 2: NEGATIVE
Parainfluenza 3: NEGATIVE
RHINOVIRUS: NEGATIVE
Respiratory Syncytial Virus A: NEGATIVE
Respiratory Syncytial Virus B: NEGATIVE

## 2014-12-27 MED ORDER — METRONIDAZOLE IN NACL 5-0.79 MG/ML-% IV SOLN
500.0000 mg | Freq: Three times a day (TID) | INTRAVENOUS | Status: DC
  Administered 2014-12-27 – 2014-12-29 (×6): 500 mg via INTRAVENOUS
  Filled 2014-12-27 (×7): qty 100

## 2014-12-27 MED ORDER — OSELTAMIVIR PHOSPHATE 30 MG PO CAPS
30.0000 mg | ORAL_CAPSULE | Freq: Two times a day (BID) | ORAL | Status: DC
  Administered 2014-12-27 – 2014-12-31 (×8): 30 mg via ORAL
  Filled 2014-12-27 (×8): qty 1

## 2014-12-27 NOTE — Evaluation (Signed)
Occupational Therapy Evaluation Patient Details Name: Chelsea Cruz MRN: [id number] DOB: 02/21/1941 Today's Date: 12/27/2014    History of Present Illness 74 yo female admitted with PE. Hx of craniotomy 10/2014 with residual R sided weakness, HTN, COPD, lung caner with brain mets. osteopenia.    Clinical Impression   Pt is a 74 y/o female whom presents with decreased ability to perform ADL's and functional mobility/transfers related to ADL's secondary to significant PMH and recent hospitialization related to pulmonary embolism. Pt is currently Mod A sit to stand and basic transfers related to ADL's/toileting, as pt is very weak. Family planning for SNF when medically able.    Follow Up Recommendations  SNF;Supervision/Assistance - 24 hour    Equipment Recommendations  Other (comment) (defer to next venue)    Recommendations for Other Services       Precautions / Restrictions Precautions Precautions: Fall Precaution Comments: droplet.  Restrictions Weight Bearing Restrictions: No      Mobility Bed Mobility Overal bed mobility: Needs Assistance Bed Mobility: Supine to Sit     Supine to sit: HOB elevated;Mod assist     General bed mobility comments: Assist for LEs off bed and trunk to upright. Increased time. Utilized bedpad for scooting, positioning.   Transfers Overall transfer level: Needs assistance Equipment used: Rolling walker (2 wheeled) Transfers: Sit to/from [facility] Sit to Stand: Mod assist Stand pivot transfers: Mod assist       General transfer comment: Assist to rise, stabilize, control descent. VCS safety, technique, hand placement. Momentum + rocking+counting is pt preference.     Balance Overall balance assessment: Needs assistance Sitting-balance support: Bilateral upper extremity supported Sitting balance-Leahy Scale: Poor   Postural control: Posterior lean Standing balance support: Bilateral upper extremity  supported Standing balance-Leahy Scale: Poor                              ADL Overall ADL's : Needs assistance/impaired     Grooming: Wash/dry hands;Wash/dry face;Sitting;Set up   Upper Body Bathing: Sitting;Minimal assitance;Cueing for safety;Cueing for sequencing   Lower Body Bathing: Maximal assistance;Sit to/from stand;Cueing for sequencing;Cueing for safety   Upper Body Dressing : Minimal assistance;Cueing for safety;Cueing for sequencing   Lower Body Dressing: Maximal assistance;Sit to/from stand;Cueing for safety;Cueing for sequencing   Toilet Transfer: Moderate assistance;BSC;Cueing for sequencing;Stand-pivot;Cueing for safety   Toileting- Clothing Manipulation and Hygiene: +2 for physical assistance;Moderate assistance;Sit to/from stand;Cueing for safety;Cueing for sequencing Toileting - Clothing Manipulation Details (indicate cue type and reason): Second person for hygine and to move 3:1 so pt can transfer to chair; very few steps, increased time and assistance as pt fatigued     Functional mobility during ADLs: Moderate assistance;+2 for physical assistance;Cueing for safety;Cueing for sequencing (Gait belt and count of 3 for standing ) General ADL Comments: Pt is a 74 y/o female whom presents with decreased ability to perform ADL's and functional mobility and transfers related to ADL's secondary to significant PMH and recent hospitialization related to pulmonary embolism. Pt/daughter were educated in role of OT and she participated in an ADL retraining session to include bed mobility, SPT from EOB to 3:1 and then to chair w/ second person to move and position chair as pt is very weak. Family planning for SNF when medically able.     Vision  No change from baseline   Perception     Praxis      Pertinent Vitals/Pain Pain Assessment: No/denies  pain     Hand Dominance Right   Extremity/Trunk Assessment Upper Extremity Assessment Upper Extremity  Assessment: Generalized weakness   Lower Extremity Assessment Lower Extremity Assessment: Defer to PT evaluation   Cervical / Trunk Assessment Cervical / Trunk Assessment: Normal   Communication Communication Communication: No difficulties   Cognition Arousal/Alertness: Awake/alert Behavior During Therapy: WFL for tasks assessed/performed   Area of Impairment: Problem solving             Problem Solving: Requires tactile cues;Requires verbal cues;Slow processing     General Comments       Exercises       Shoulder Instructions      Home Living Family/patient expects to be discharged to:: Private residence Living Arrangements: Children Available Help at Discharge: Family Type of Home: House Home Access: Stairs to enter     [place]: One level     Bathroom Shower/Tub: Teacher, early years/pre: Handicapped height     Home Equipment: Environmental consultant - 2 wheels;Tub bench          Prior Functioning/Environment Level of Independence: Needs assistance  Gait / Transfers Assistance Needed: Assist to stand and ambulate with RW ADL's / Homemaking Assistance Needed: Assistance for all ADL's (bathing, dressing and toileting) and homemaking tasks        OT Diagnosis: Generalized weakness   OT Problem List: Decreased strength;Decreased activity tolerance;Impaired balance (sitting and/or standing);Decreased knowledge of use of DME or AE   OT Treatment/Interventions: Self-care/ADL training;Energy conservation;DME and/or AE instruction;Patient/family education;Therapeutic activities;Therapeutic exercise;Balance training    OT Goals(Current goals can be found in the care plan section) Acute Rehab OT Goals Patient Stated Goal: Go to SNF for Rehab Time For Goal Achievement: 01/10/15 Potential to Achieve Goals: Good  OT Frequency: Min 2X/week   Barriers to D/C:            Co-evaluation              End of Session Equipment Utilized During Treatment:  Gait belt;Rolling walker;Oxygen Nurse Communication: Mobility status  Activity Tolerance: Patient limited by fatigue Patient left: in chair;with call Luna/phone within reach;with chair alarm set;with nursing/sitter in room;with family/visitor present   Time: 0630-1601 OT Time Calculation (min): 24 min Charges:  OT General Charges $OT Visit: 1 Procedure OT Evaluation $Initial OT Evaluation Tier I: 1 Procedure OT Treatments $Self Care/Home Management : 8-22 mins G-Codes:    Josephine Igo Dixon, OTR/L 12/27/2014, 10:01 AM

## 2014-12-27 NOTE — Progress Notes (Signed)
Pharmacy Consult: Antibiotic Renal Dose Adjustment  Tamiflu 75 mg BID x 5 days ordered for positive influenza A&B on respiratory panel.  CrCl~53 ml/min (CG, using SCr 0.8 adjusted for age)  Pharmacy Action: Adjusted Tamiflu dose to 30 mg BID x 5 days for CrCl<60 ml/min.  Hershal Coria, PharmD, BCPS Pager: [phone] 12/27/2014 7:32 PM

## 2014-12-27 NOTE — Progress Notes (Signed)
PATIENT DETAILS Name: Chelsea Cruz Age: 74 y.o. Sex: female Date of Birth: 05-31-1941 Admit Date: 12/24/2014 Admitting Physician Ivor Costa, MD SVX:[id number],SPQZR, MD  Subjective: Feels better. Low grade fever overnight  Assessment/Plan: Principal Problem:   Pulmonary embolism: Admitted and started on intravenous heparin infusion. Discussed with Dr. Vertell Limber, since patient 2 months out from craniotomy, okay to be anticoagulated long term with oral novel agents. Discussed case with Dr. Julien Nordmann, he recommends Lovenox long-term given active cancer. Subsequently started on Lovenox,  2-D echocardiogram shows preserved EF.  Lovenox teaching started by RN.  Active Problems:    Sepsis: likely secondary to post obstructive PNA. Blood cultures negative, await Resp Virus panel. Will Stop Vanco, continue with Azactam, and add Flagyl for anaerobic coverage given intermittent low grade fever. Alternately, low grade fever could be from VTE/Malignancy as well.s.     Suspected postobstructive pneumonia: Antibiotics as above. Low-grade fever overnight, but clinically improved .    Anemia:Secondary to chronic disease and acute illness. CBC stable. Required on unit of PRBC on 3/10     History of non-small cell lung cancer with brain metastases-status post craniotomy in January 2016: Spoke with Dr. Julien Nordmann, see above. Continue Keppra for seizure prophylaxis    Recurrent SVT: Monitor in telemetry. Continue low-dose metoprolol.     COPD: Appears stable. Lungs basically clear. Continue with as needed bronchodilators     GERD: Continue PPI.     Mildly elevated liver function: Follow periodically.    Disposition: Remain inpatient-patient/family wanting to go to SNF. Have asked RN to notify SW  Antibiotics:  See below   Anti-infectives    Start     Dose/Rate Route Frequency Ordered Stop   12/27/14 0900  metroNIDAZOLE (FLAGYL) IVPB 500 mg     500 mg 100 mL/hr over 60 Minutes Intravenous Every 8  hours 12/27/14 0829     12/25/14 1400  aztreonam (AZACTAM) 1 g in dextrose 5 % 50 mL IVPB     1 g 100 mL/hr over 30 Minutes Intravenous 3 times per day 12/25/14 0238     12/25/14 1200  vancomycin (VANCOCIN) 500 mg in sodium chloride 0.9 % 100 mL IVPB  Status:  Discontinued     500 mg 100 mL/hr over 60 Minutes Intravenous Every 12 hours 12/25/14 0238 12/27/14 0829   12/25/14 0600  aztreonam (AZACTAM) injection 1 g  Status:  Discontinued     1 g Intramuscular 3 times per day 12/25/14 0155 12/25/14 0226   12/25/14 0200  vancomycin (VANCOCIN) IVPB 750 mg/150 ml premix  Status:  Discontinued     750 mg 150 mL/hr over 60 Minutes Intravenous Every 8 hours 12/25/14 0155 12/25/14 0212   12/24/14 2315  aztreonam (AZACTAM) 2 g in dextrose 5 % 50 mL IVPB     2 g 100 mL/hr over 30 Minutes Intravenous  Once 12/24/14 2308 12/25/14 0330   12/24/14 2315  vancomycin (VANCOCIN) IVPB 1000 mg/200 mL premix     1,000 mg 200 mL/hr over 60 Minutes Intravenous  Once 12/24/14 2308 12/25/14 0050      DVT Prophylaxis: On therapeutic Lovenox  Code Status: Full code   Family Communication Daughter at bedside  Procedures:  None  CONSULTS:  None  MEDICATIONS: Scheduled Meds: . antiseptic oral rinse  7 mL Mouth Rinse BID  . aztreonam  1 g Intravenous 3 times per day  . cholecalciferol  400 Units Oral Daily  . enoxaparin (LOVENOX) injection  80 mg Subcutaneous Q24H  . levETIRAcetam  500 mg Oral BID  . metoprolol tartrate  12.5 mg Oral BID  . metronidazole  500 mg Intravenous Q8H  . pantoprazole  40 mg Oral QHS   Continuous Infusions:   PRN Meds:.acetaminophen, albuterol, calcium carbonate, chlorpheniramine-HYDROcodone, morphine injection, oxyCODONE    PHYSICAL EXAM: Vital signs in last 24 hours: Filed Vitals:   12/26/14 2000 12/26/14 2024 12/26/14 2159 12/27/14 0405  BP:  131/63 123/85 108/54  Pulse:  113 95 79  Temp: 100.6 F (38.1 C) 99.6 F (37.6 C) 99.8 F (37.7 C) 97.8 F (36.6 C)    TempSrc:  Oral Oral Oral  Resp:  20 20 20   Height:      Weight:      SpO2:  95% 96% 97%    Weight change:  Filed Weights   12/24/14 1804 12/25/14 0059  Weight: 56.7 kg (125 lb) 55.7 kg (122 lb 12.7 oz)   Body mass index is 23.21 kg/(m^2).   Gen Exam: Awake and alert with clear speech.   Neck: Supple, No JVD.   Chest: B/L Clear.  No rales CVS: S1 S2 Regular, no murmurs.  Abdomen: soft, BS +, non tender, non distended.  Extremities: no edema, lower extremities warm to touch. Neurologic: Non Focal.   Skin: No Rash.   Wounds: N/A.    Intake/Output from previous day:  Intake/Output Summary (Last 24 hours) at 12/27/14 0839 Last data filed at 12/27/14 0448  Gross per 24 hour  Intake 835.42 ml  Output   1651 ml  Net -815.58 ml     LAB RESULTS: CBC  Recent Labs Lab 12/24/14 1915 12/25/14 0424 12/26/14 0507 12/26/14 1830 12/27/14 0555  WBC 4.6 4.6 2.9* 4.6 3.9*  HGB 11.2* 8.8* 7.0* 11.7* 11.4*  HCT 35.2* 27.8* 22.3* 36.0 35.3*  PLT 175 148* 153 170 169  MCV 89.1 89.4 89.2 90.7 90.1  MCH 28.4 28.3 28.0 29.5 29.1  MCHC 31.8 31.7 31.4 32.5 32.3  RDW 19.9* 19.2* 18.9* 17.2* 17.6*  LYMPHSABS 1.9  --   --   --   --   MONOABS 0.3  --   --   --   --   EOSABS 0.0  --   --   --   --   BASOSABS 0.0  --   --   --   --     Chemistries   Recent Labs Lab 12/24/14 1915 12/26/14 0500 12/26/14 0507 12/27/14 0555  NA 137  --  139 139  K 3.9  --  3.1* 4.1  CL 106  --  106 102  CO2 28  --  29 27  GLUCOSE 126*  --  91 99  BUN 22  --  9 12  CREATININE 0.53  --  0.44* 0.50  CALCIUM 8.7  --  8.1* 8.7  MG  --  1.6  --   --     CBG: No results for input(s): GLUCAP in the last 168 hours.  GFR Estimated Creatinine Clearance: 45.8 mL/min (by C-G formula based on Cr of 0.5).  Coagulation profile  Recent Labs Lab 12/25/14 0236  INR 1.27    Cardiac Enzymes  Recent Labs Lab 12/24/14 1915  TROPONINI 0.03    Invalid input(s): POCBNP No results for input(s):  DDIMER in the last 72 hours. No results for input(s): HGBA1C in the last 72 hours. No results for input(s): CHOL, HDL, LDLCALC, TRIG, CHOLHDL, LDLDIRECT in the last 72 hours. No  results for input(s): TSH, T4TOTAL, T3FREE, THYROIDAB in the last 72 hours.  Invalid input(s): FREET3  Recent Labs  12/26/14 0925  VITAMINB12 115*  FOLATE 19.2  FERRITIN 424*  TIBC 189*  IRON 38*  RETICCTPCT 3.5*   No results for input(s): LIPASE, AMYLASE in the last 72 hours.  Urine Studies No results for input(s): UHGB, CRYS in the last 72 hours.  Invalid input(s): UACOL, UAPR, USPG, UPH, UTP, UGL, UKET, UBIL, UNIT, UROB, ULEU, UEPI, UWBC, URBC, UBAC, CAST, UCOM, BILUA  MICROBIOLOGY: Recent Results (from the past 240 hour(s))  Blood culture (routine x 2)     Status: None (Preliminary result)   Collection Time: 12/24/14  9:15 PM  Result Value Ref Range Status   Specimen Description BLOOD RIGHT ANTECUBITAL  Final   Special Requests BOTTLES DRAWN AEROBIC AND ANAEROBIC 5ML  Final   Culture   Final           BLOOD CULTURE RECEIVED NO GROWTH TO DATE CULTURE WILL BE HELD FOR 5 DAYS BEFORE ISSUING A FINAL NEGATIVE REPORT Performed at [facility]    Report Status PENDING  Incomplete  Blood culture (routine x 2)     Status: None (Preliminary result)   Collection Time: 12/24/14  9:40 PM  Result Value Ref Range Status   Specimen Description BLOOD RIGHT HAND  Final   Special Requests BOTTLES DRAWN AEROBIC AND ANAEROBIC 5CC   Final   Culture   Final           BLOOD CULTURE RECEIVED NO GROWTH TO DATE CULTURE WILL BE HELD FOR 5 DAYS BEFORE ISSUING A FINAL NEGATIVE REPORT Note: Culture results may be compromised due to an inadequate volume of blood received in culture bottles. Performed at [facility]    Report Status PENDING  Incomplete    RADIOLOGY STUDIES/RESULTS: Ct Chest W Contrast  12/06/2014   CLINICAL DATA:  Subsequent treatment strategy for lung cancer diagnosed in December 2014.  Pre metastasis.  EXAM: CT CHEST WITH CONTRAST  TECHNIQUE: Multidetector CT imaging of the chest was performed during intravenous contrast administration.  CONTRAST:  72mL OMNIPAQUE IOHEXOL 300 MG/ML  SOLN  COMPARISON:  CT 07/31/2014, PET-CT 03/16/2013  FINDINGS: Mediastinum/Nodes: No axillary or supraclavicular lymphadenopathy. No mediastinal lymphadenopathy. There is left peribronchial thickening (image 23, series 2). The thickening posterior to lingula bronchus measures 9 mm compared to 11 mm on prior.  Lungs/Pleura: Perihilar masslike thickening surrounds the left upper lobe bronchus (same lesion as above) and measure approximately 22 x 20 mm on lung window series Image 22. This is also similar to prior where lesion measured 25 x 24 mm on image 23, series 5, 07/31/2014  There is volume loss in the left hemi thorax. There is perihilar consolidation and bronchiectasis in the left lower lobe which is slightly improved compared prior. Interval improvement in the right left pleural effusion compared to prior.  No pulmonary nodules on the right  Upper abdomen: Thickening of the adrenal glands is unchanged comparison exam. Adrenal thickening was not hypermetabolic comparison PET-CT scan.  Musculoskeletal: No aggressive osseous lesion.  IMPRESSION: 1. No evidence of lung cancer progression. 2. Left perihilar masslike soft tissue thickening surrounding the central upper lobe bronchi is not changed. 3. Interval improvement in the left lower lobe consolidation and pleural fluid.   Electronically Signed   By: Suzy Bouchard M.D.   On: 12/06/2014 08:30   Ct Angio Chest Pe W/cm &/or Wo Cm  12/24/2014   CLINICAL DATA:  Palpitations since yesterday. Shortness of breath. Left lung cancer about 20 months ago with radiation and chemotherapy.  EXAM: CT ANGIOGRAPHY CHEST WITH CONTRAST  TECHNIQUE: Multidetector CT imaging of the chest was performed using the standard protocol during bolus administration of intravenous contrast.  Multiplanar CT image reconstructions and MIPs were obtained to evaluate the vascular anatomy.  CONTRAST:  75mL OMNIPAQUE IOHEXOL 350 MG/ML SOLN  COMPARISON:  12/05/2014  FINDINGS: Technically adequate study with good opacification of the central and segmental pulmonary arteries. Filling defects are demonstrated in the baby right upper lobe anterior segmental pulmonary artery consistent with focal acute pulmonary embolus. This is new since the previous study. No large central pulmonary emboli. Ventricular measurements are limited but RV to LV ratio appears to be increased at 1.2. This may be artifactual due to motion artifact but right heart strain is not excluded.  Normal heart size. Normal caliber thoracic aorta. No aortic dissection. Great vessel origins are patent. Calcification in the coronary arteries and aorta. Esophagus is decompressed.  There is diffuse scarring, consolidation, and volume loss in the left lung with masslike infiltration in the left hilum. This corresponds to the patient's numb carcinoma and likely represents combination of mass and postobstructive change with radiation changes. Degree of consolidation is slightly more prominent than on the prior study. Diffuse emphysematous changes throughout the aerated portions of both lungs. No pleural effusions. No pneumothorax.  Included portions of the upper abdominal organs are grossly unremarkable. Degenerative changes in the spine. Old left rib fractures. No destructive bone lesions.  Review of the MIP images confirms the above findings.  IMPRESSION: Positive examination for pulmonary embolus involving right upper lung segmental pulmonary artery. Heartbeat LV ratio is increased. This may be artifactual but right heart strain is not excluded. Left hilar mass with postobstructive change and radiation change. Mild progression of consolidation since prior study.  These results were called by telephone at the time of interpretation on 12/24/2014 at 9:24 pm  to Dr. Sherwood Gambler , who verbally acknowledged these results.   Electronically Signed   By: Lucienne Capers M.D.   On: 12/24/2014 21:26   Dg Chest Port 1 View  12/24/2014   CLINICAL DATA:  sob onset today with a dry cough x 2 days and intermittent chest pain and palpitations x 1 week. States she also has had right foot swelling x 2 weeks. Hx of lung cancer, brain cancer, COPD, HTN and radiation therapy  EXAM: PORTABLE CHEST - 1 VIEW  COMPARISON:  CT 12/05/2014 and earlier studies  FINDINGS: Elevated left diaphragmatic leaflet. Consolidation/atelectasis in the left mid and lower lung. Right lung clear. Heart size difficult to assess due to adjacent opacity and leftward mediastinal shift. Chronic blunting of the left lateral costophrenic angle. No pneumothorax. Visualized skeletal structures are unremarkable.  IMPRESSION: 1. Left volume loss and parenchymal changes as before, without acute or superimposed abnormality.   Electronically Signed   By: Lucrezia Europe M.D.   On: 12/24/2014 18:49    Oren Binet, MD  [facility] Pager:[phone]  If 7PM-7AM, please contact night-coverage [email or web address] Password [facility] 12/27/2014, 8:39 AM   LOS: 3 days

## 2014-12-27 NOTE — Progress Notes (Signed)
Physical Therapy Treatment Patient Details Name: MARLISS BUTTACAVOLI MRN: [id number] DOB: 1941/05/01 Today's Date: 12/27/2014    History of Present Illness 74 yo female admitted with PE. Hx of craniotomy 10/2014 with residual R sided weakness, HTN, COPD, lung caner with brain mets. osteopenia.     PT Comments    Progressing slowly with mobility. One instance of LE buckling towards ambulation distance requiring external assistance to prevent fall. Continues to require Mod assist for mobility. Discussed d/c plan with pt and daughter again on today-pt/family now would like to d/c to SNF for ST rehab. Feel pt would benefit greatly from an increased frequency and duration of therapy that SNF can offer in order to maximize independence and safety with functional mobility.   Follow Up Recommendations  SNF;Supervision/Assistance - 24 hour     Equipment Recommendations  None recommended by PT    Recommendations for Other Services OT consult     Precautions / Restrictions Precautions Precautions: Fall Precaution Comments: droplet.  Restrictions Weight Bearing Restrictions: No    Mobility  Bed Mobility Overal bed mobility: Needs Assistance Bed Mobility: Supine to Sit;Sit to Supine     Supine to sit: Mod assist;HOB elevated Sit to supine: Min assist   General bed mobility comments: Assist for LEs off bed and trunk to upright. Increased time.   Transfers Overall transfer level: Needs assistance Equipment used: Rolling walker (2 wheeled) Transfers: Sit to/from Stand Sit to Stand: Mod assist         General transfer comment: Assist to rise, stabilize, control descent. VCS safety, technique, hand placement. Momentum + rocking+counting is pt preference.   Ambulation/Gait Ambulation/Gait assistance: Min assist Ambulation Distance (Feet): 60 Feet Assistive device: Rolling walker (2 wheeled) Gait Pattern/deviations: Step-through pattern;Decreased stride length;Trunk flexed      General Gait Details: Assist to stabilize pt and maneuver safely with RW. Remained on Colp O2-2L. Dyspnea 2/4. Fatigues fairly quickly. LE buckling towards end of distance requiring external assistance to prevent fall.    Stairs            Wheelchair Mobility    Modified Rankin (Stroke Patients Only)       Balance                                    Cognition Arousal/Alertness: Awake/alert Behavior During Therapy: WFL for tasks assessed/performed   Area of Impairment: Problem solving             Problem Solving: Requires tactile cues;Requires verbal cues;Slow processing      Exercises      General Comments        Pertinent Vitals/Pain Pain Assessment: No/denies pain    Home Living                      Prior Function            PT Goals (current goals can now be found in the care plan section) Progress towards PT goals: Progressing toward goals    Frequency  Min 3X/week    PT Plan Discharge plan needs to be updated    Co-evaluation             End of Session Equipment Utilized During Treatment: Gait belt;Oxygen Activity Tolerance: Patient tolerated treatment well Patient left: in bed;with call Kinzler/phone within reach;with family/visitor present     Time: 1540-1556 PT Time Calculation (min) (ACUTE  ONLY): 16 min  Charges:  $Gait Training: 8-22 mins                    G Codes:      Weston Anna, MPT Pager: [phone]

## 2014-12-27 NOTE — Progress Notes (Signed)
ANTIBIOTIC CONSULT NOTE - Follow up  Pharmacy Consult for Aztreonam Indication: HCAP  Allergies  Allergen Reactions  . Penicillins Swelling   Patient Measurements: Height: _0  (154.9 cm) Weight: 122 lb 12.7 oz (55.7 kg) IBW/kg (Calculated) : 47.8  Vital Signs: Temp: 97.8 F (36.6 C) (03/11 0405) Temp Source: Oral (03/11 0405) BP: 119/56 mmHg (03/11 0928) Pulse Rate: 101 (03/11 0928) Intake/Output from previous day: 03/10 0701 - 03/11 0700 In: 955.4 [P.O.:240; Blood:415.4; IV Piggyback:300] Out: 1651 [Urine:1650; Stool:1] Intake/Output from this shift: Total I/O In: 270 [P.O.:220; IV Piggyback:50] Out: -   Labs:  Recent Labs  12/24/14 1915  12/26/14 0507 12/26/14 1830 12/27/14 0555  WBC 4.6  < > 2.9* 4.6 3.9*  HGB 11.2*  < > 7.0* 11.7* 11.4*  PLT 175  < > 153 170 169  CREATININE 0.53  --  0.44*  --  0.50  < > = values in this interval not displayed. Estimated Creatinine Clearance: 45.8 mL/min (by C-G formula based on Cr of 0.5). No results for input(s): VANCOTROUGH, VANCOPEAK, VANCORANDOM, GENTTROUGH, GENTPEAK, GENTRANDOM, TOBRATROUGH, TOBRAPEAK, TOBRARND, AMIKACINPEAK, AMIKACINTROU, AMIKACIN in the last 72 hours.   Microbiology: Recent Results (from the past 720 hour(s))  TECHNOLOGIST REVIEW     Status: None   Collection Time: 12/05/14  3:24 PM  Result Value Ref Range Status   Technologist Review 1% myelocyte  Final  Blood culture (routine x 2)     Status: None (Preliminary result)   Collection Time: 12/24/14  9:15 PM  Result Value Ref Range Status   Specimen Description BLOOD RIGHT ANTECUBITAL  Final   Special Requests BOTTLES DRAWN AEROBIC AND ANAEROBIC 5ML  Final   Culture   Final           BLOOD CULTURE RECEIVED NO GROWTH TO DATE CULTURE WILL BE HELD FOR 5 DAYS BEFORE ISSUING A FINAL NEGATIVE REPORT Performed at [facility]    Report Status PENDING  Incomplete  Blood culture (routine x 2)     Status: None (Preliminary result)   Collection  Time: 12/24/14  9:40 PM  Result Value Ref Range Status   Specimen Description BLOOD RIGHT HAND  Final   Special Requests BOTTLES DRAWN AEROBIC AND ANAEROBIC 5CC   Final   Culture   Final           BLOOD CULTURE RECEIVED NO GROWTH TO DATE CULTURE WILL BE HELD FOR 5 DAYS BEFORE ISSUING A FINAL NEGATIVE REPORT Note: Culture results may be compromised due to an inadequate volume of blood received in culture bottles. Performed at [facility]    Report Status PENDING  Incomplete   Assessment: HPI: HCAP: HTN, HLD, depression, COPD, GERD, lung ca with brain met patient with SOB. [facility]anc [facility] at [facility].   3/9 >> Vanc >> 3/11 3/9 >> Aztreo >> 3/11 >> Flagyl(MD)  Tmax: AF WBCs: dropped to below normal Renal: SCr WNL, CrCl 69N (using SCr 0.8) PCT < 0.1 Lactic acid: 1.5  3/8 blood x2: ngtd / sputum: not collected 3/9 resp virus panel: pend 3/9 legionella Ag: neg 3/9 Strep Ag: neg  Goal of Therapy:  Appropriate antibiotic dosing for renal function; eradication of infection  Plan:  Aztreonam 1g IV q8h for possible post-obstructive pneumonia. Started Flagyl for coverage of anaerobes.  ? Duration of antibiotics. Follow up renal fxn, culture results, and clinical course.  Romeo Rabon, PharmD, pager [phone]. 12/27/2014,11:14 AM.

## 2014-12-27 NOTE — Progress Notes (Signed)
Patient has a bed at [facility] & [facility] authorization is good through Sunday, 3/13. If patient is ready for discharge over the weekend, please contact [facility] [place] (ph#: [phone]).  *MD: please sign FL2 on shadow chart in cabinet.    Clinical Social Work Department BRIEF PSYCHOSOCIAL ASSESSMENT 12/27/2014  Patient:  Chelsea Cruz, Chelsea Cruz     Account Number:  [id number]     [employer] date:  12/24/2014  Clinical Social Worker:  Renold Genta  Date/Time:  12/27/2014 04:11 PM  Referred by:  Physician  Date Referred:  12/27/2014 Referred for  SNF Placement   Other Referral:   Interview type:  Patient Other interview type:   and daughter, Chelsea Cruz at bedside    PSYCHOSOCIAL DATA Living Status:  [place] Admitted from facility:   Level of care:   Primary support name:  Donnie Coffin (daughter) h#: [phone] c#: [phone] Primary support relationship to patient:  CHILD, ADULT Degree of support available:   good    CURRENT CONCERNS Current Concerns  Post-Acute Placement   Other Concerns:    SOCIAL WORK ASSESSMENT / PLAN CSW received consult for SNF placement.   Assessment/plan status:  Information/Referral to [employer] Other assessment/ plan:   Information/referral to community resources:   CSW completed FL2 and faxed information out to [facility] - provided SNF bed offers to patient.    PATIENT'S/FAMILY'S RESPONSE TO PLAN OF CARE: Patient accepted bed at [facility], stating that they know someone who works there and have heard good things about the facility. Patient states that since her last surgery, she's moved in with her daughter, Chelsea Cruz and her husband & 75 year old grandson who have been taking care of her - and plans to return there after rehab stay. CSW obtained [facility] authorization which is good through Sunday, 3/13.         Raynaldo Opitz, [facility] Clinical Social Worker cell #:  [phone]

## 2014-12-27 NOTE — Progress Notes (Signed)
Clinical Social Work Department CLINICAL SOCIAL WORK PLACEMENT NOTE 12/27/2014  Patient:  Chelsea Cruz, Chelsea Cruz  Account Number:  [id number] Admit date:  12/24/2014  Clinical Social Worker:  Renold Genta  Date/time:  12/27/2014 04:17 PM  Clinical Social Work is seeking post-discharge placement for this patient at the following level of care:   SKILLED NURSING   (*CSW will update this form in Epic as items are completed)   12/27/2014  Patient/family provided with [place] Department of Clinical Social Work's list of facilities offering this level of care within the geographic area requested by the patient (or if unable, by the patient's family).  12/27/2014  Patient/family informed of their freedom to choose among providers that offer the needed level of care, that participate in Medicare, Medicaid or managed care program needed by the patient, have an available bed and are willing to accept the patient.  12/27/2014  Patient/family informed of [facility]' ownership interest in [facility], as well as of the fact that they are under no obligation to receive care at this facility.  PASARR submitted to EDS on 12/27/2014 PASARR number received on 12/27/2014  FL2 transmitted to all facilities in geographic area requested by pt/family on  12/27/2014 FL2 transmitted to all facilities within larger geographic area on   Patient informed that his/her managed care company has contracts with or will negotiate with  certain facilities, including the following:     Patient/family informed of bed offers received:  12/27/2014 Patient chooses bed at [facility] Physician recommends and patient chooses bed at    Patient to be transferred to [place] on   Patient to be transferred to facility by  Patient and family notified of transfer on  Name of family member notified:    The following physician request were entered in Epic:   Additional Comments:    Raynaldo Opitz, [place] Social Worker cell #: [phone]

## 2014-12-27 NOTE — Progress Notes (Signed)
Dr Sloan Leiter paged to make aware pt resp panel resulted.  Pt + for Influenza A&B.

## 2014-12-28 LAB — COMPREHENSIVE METABOLIC PANEL
ALBUMIN: 2.6 g/dL — AB (ref 3.5–5.2)
ALK PHOS: 45 U/L (ref 39–117)
ALT: 44 U/L — AB (ref 0–35)
ALT: 40 U/L — ABNORMAL HIGH (ref 0–35)
AST: 25 U/L (ref 0–37)
AST: 23 U/L (ref 0–37)
Albumin: 2.2 g/dL — ABNORMAL LOW (ref 3.5–5.2)
Alkaline Phosphatase: 40 U/L (ref 39–117)
Anion gap: 10 (ref 5–15)
Anion gap: 6 (ref 5–15)
BILIRUBIN TOTAL: 1 mg/dL (ref 0.3–1.2)
BUN: 12 mg/dL (ref 6–23)
BUN: 11 mg/dL (ref 6–23)
CO2: 27 mmol/L (ref 19–32)
CO2: 31 mmol/L (ref 19–32)
Calcium: 8.7 mg/dL (ref 8.4–10.5)
Calcium: 8.3 mg/dL — ABNORMAL LOW (ref 8.4–10.5)
Chloride: 102 mmol/L (ref 96–112)
Chloride: 99 mmol/L (ref 96–112)
Creatinine, Ser: 0.5 mg/dL (ref 0.50–1.10)
Creatinine, Ser: 0.49 mg/dL — ABNORMAL LOW (ref 0.50–1.10)
GFR calc Af Amer: >90 mL/min (ref >90)
GFR calc Af Amer: >90 mL/min (ref >90)
GFR calc non Af Amer: >90 mL/min (ref >90)
GFR calc non Af Amer: >90 mL/min (ref >90)
Glucose, Bld: 99 mg/dL (ref 70–99)
Glucose, Bld: 99 mg/dL (ref 70–99)
POTASSIUM: 4.1 mmol/L (ref 3.5–5.1)
POTASSIUM: 3.3 mmol/L — AB (ref 3.5–5.1)
SODIUM: 139 mmol/L (ref 135–145)
SODIUM: 136 mmol/L (ref 135–145)
Total Bilirubin: 2 mg/dL — ABNORMAL HIGH (ref 0.3–1.2)
Total Protein: 5.5 g/dL — ABNORMAL LOW (ref 6.0–8.3)
Total Protein: 5 g/dL — ABNORMAL LOW (ref 6.0–8.3)

## 2014-12-28 LAB — CBC
HCT: 29.7 % — ABNORMAL LOW (ref 36.0–46.0)
Hemoglobin: 9.7 g/dL — ABNORMAL LOW (ref 12.0–15.0)
MCH: 29.3 pg (ref 26.0–34.0)
MCHC: 32.7 g/dL (ref 30.0–36.0)
MCV: 89.7 fL (ref 78.0–100.0)
Platelets: 179 10*3/uL (ref 150–400)
RBC: 3.31 MIL/uL — AB (ref 3.87–5.11)
RDW: 17.7 % — AB (ref 11.5–15.5)
WBC: 4 10*3/uL (ref 4.0–10.5)

## 2014-12-28 MED ORDER — POTASSIUM CHLORIDE CRYS ER 20 MEQ PO TBCR
40.0000 meq | EXTENDED_RELEASE_TABLET | Freq: Once | ORAL | Status: AC
  Administered 2014-12-28: 40 meq via ORAL
  Filled 2014-12-28: qty 2

## 2014-12-28 NOTE — Progress Notes (Signed)
PATIENT DETAILS Name: Chelsea Cruz Age: 74 y.o. Sex: female Date of Birth: 07/05/1941 Admit Date: 12/24/2014 Admitting Physician Ivor Costa, MD DGU:[id number], MD  Subjective: Feels better. No major complaints.  Assessment/Plan: Principal Problem:   Pulmonary embolism: Admitted and started on intravenous heparin infusion. Discussed with Dr. Vertell Limber, since patient 2 months out from craniotomy, okay to be anticoagulated long term with oral novel agents. Discussed case with Dr. Julien Nordmann, he recommends Lovenox long-term given active cancer. Subsequently started on Lovenox,  2-D echocardiogram shows preserved EF.  Lovenox teaching started by RN.  Active Problems:    Sepsis: likely secondary to post obstructive PNA and influenza. Blood cultures negative,  Resp Virus panel positive for both influenza A and influenza B. Although symptoms have been going on for a number of days, she has active malignancy, started on Tamiflu and high risk patient. Continue with Azactam and Flagyl. No low-grade fever overnight. Will Stop Vanco, continue with Azactam, and add Flagyl for anaerobic coverage given intermittent low grade fever.      Suspected postobstructive pneumonia: Antibiotics as above. Improve, no further fever. .    Anemia:Secondary to chronic disease and acute illness. CBC stable. Required on unit of PRBC on 3/10     History of non-small cell lung cancer with brain metastases-status post craniotomy in January 2016: Spoke with Dr. Julien Nordmann, see above. Continue Keppra for seizure prophylaxis    Recurrent SVT: Monitor in telemetry. Continue low-dose metoprolol. Echocardiogram shows preserved ejection fraction     COPD: Appears stable. Lungs basically clear. Continue with as needed bronchodilators     GERD: Continue PPI.     Mildly elevated liver function: Follow periodically-LFTs are markedly improved.    Disposition: Remain inpatient- suspect SNF on Monday  Antibiotics:  See  below   Anti-infectives    Start     Dose/Rate Route Frequency Ordered Stop   12/27/14 2200  oseltamivir (TAMIFLU) capsule 30 mg     30 mg Oral 2 times daily 12/27/14 1903 01/01/15 2159   12/27/14 0900  metroNIDAZOLE (FLAGYL) IVPB 500 mg     500 mg 100 mL/hr over 60 Minutes Intravenous Every 8 hours 12/27/14 0829     12/25/14 1400  aztreonam (AZACTAM) 1 g in dextrose 5 % 50 mL IVPB     1 g 100 mL/hr over 30 Minutes Intravenous 3 times per day 12/25/14 0238     12/25/14 1200  vancomycin (VANCOCIN) 500 mg in sodium chloride 0.9 % 100 mL IVPB  Status:  Discontinued     500 mg 100 mL/hr over 60 Minutes Intravenous Every 12 hours 12/25/14 0238 12/27/14 0829   12/25/14 0600  aztreonam (AZACTAM) injection 1 g  Status:  Discontinued     1 g Intramuscular 3 times per day 12/25/14 0155 12/25/14 0226   12/25/14 0200  vancomycin (VANCOCIN) IVPB 750 mg/150 ml premix  Status:  Discontinued     750 mg 150 mL/hr over 60 Minutes Intravenous Every 8 hours 12/25/14 0155 12/25/14 0212   12/24/14 2315  aztreonam (AZACTAM) 2 g in dextrose 5 % 50 mL IVPB     2 g 100 mL/hr over 30 Minutes Intravenous  Once 12/24/14 2308 12/25/14 0330   12/24/14 2315  vancomycin (VANCOCIN) IVPB 1000 mg/200 mL premix     1,000 mg 200 mL/hr over 60 Minutes Intravenous  Once 12/24/14 2308 12/25/14 0050      DVT Prophylaxis: On therapeutic Lovenox  Code Status: Full code  Family Communication None at bedside  Procedures:  None  CONSULTS:  None  MEDICATIONS: Scheduled Meds: . antiseptic oral rinse  7 mL Mouth Rinse BID  . aztreonam  1 g Intravenous 3 times per day  . cholecalciferol  400 Units Oral Daily  . enoxaparin (LOVENOX) injection  80 mg Subcutaneous Q24H  . levETIRAcetam  500 mg Oral BID  . metoprolol tartrate  12.5 mg Oral BID  . metronidazole  500 mg Intravenous Q8H  . oseltamivir  30 mg Oral BID  . pantoprazole  40 mg Oral QHS   Continuous Infusions:   PRN Meds:.acetaminophen, albuterol,  calcium carbonate, chlorpheniramine-HYDROcodone, morphine injection, oxyCODONE    PHYSICAL EXAM: Vital signs in last 24 hours: Filed Vitals:   12/27/14 1430 12/27/14 2046 12/28/14 0652 12/28/14 1037  BP: 97/53 133/53 106/49 115/58  Pulse: 94 103 101 102  Temp: 99.6 F (37.6 C) 99.5 F (37.5 C) 97.5 F (36.4 C)   TempSrc: Oral Oral Oral   Resp: 20 20 16    Height:      Weight:      SpO2: 96% 91% 94%     Weight change:  Filed Weights   12/24/14 1804 12/25/14 0059  Weight: 56.7 kg (125 lb) 55.7 kg (122 lb 12.7 oz)   Body mass index is 23.21 kg/(m^2).   Gen Exam: Awake and alert with clear speech.  Not in any distress Neck: Supple, No JVD.   Chest: B/L Clear.  No rales or rhonchi CVS: S1 S2 Regular, no murmurs.  Abdomen: soft, BS +, non tender, non distended.  Extremities: no edema, lower extremities warm to touch. Neurologic: Non Focal.   Skin: No Rash.   Wounds: N/A.    Intake/Output from previous day:  Intake/Output Summary (Last 24 hours) at 12/28/14 1143 Last data filed at 12/28/14 0809  Gross per 24 hour  Intake    880 ml  Output   1100 ml  Net   -220 ml     LAB RESULTS: CBC  Recent Labs Lab 12/24/14 1915 12/25/14 0424 12/26/14 0507 12/26/14 1830 12/27/14 0555 12/28/14 0602  WBC 4.6 4.6 2.9* 4.6 3.9* 4.0  HGB 11.2* 8.8* 7.0* 11.7* 11.4* 9.7*  HCT 35.2* 27.8* 22.3* 36.0 35.3* 29.7*  PLT 175 148* 153 170 169 179  MCV 89.1 89.4 89.2 90.7 90.1 89.7  MCH 28.4 28.3 28.0 29.5 29.1 29.3  MCHC 31.8 31.7 31.4 32.5 32.3 32.7  RDW 19.9* 19.2* 18.9* 17.2* 17.6* 17.7*  LYMPHSABS 1.9  --   --   --   --   --   MONOABS 0.3  --   --   --   --   --   EOSABS 0.0  --   --   --   --   --   BASOSABS 0.0  --   --   --   --   --     Chemistries   Recent Labs Lab 12/24/14 1915 12/26/14 0500 12/26/14 0507 12/27/14 0555 12/28/14 0602  NA 137  --  139 139 136  K 3.9  --  3.1* 4.1 3.3*  CL 106  --  106 102 99  CO2 28  --  29 27 31   GLUCOSE 126*  --  91 99 99   BUN 22  --  9 12 11   CREATININE 0.53  --  0.44* 0.50 0.49*  CALCIUM 8.7  --  8.1* 8.7 8.3*  MG  --  1.6  --   --   --  CBG: No results for input(s): GLUCAP in the last 168 hours.  GFR Estimated Creatinine Clearance: 45.8 mL/min (by C-G formula based on Cr of 0.49).  Coagulation profile  Recent Labs Lab 12/25/14 0236  INR 1.27    Cardiac Enzymes  Recent Labs Lab 12/24/14 1915  TROPONINI 0.03    Invalid input(s): POCBNP No results for input(s): DDIMER in the last 72 hours. No results for input(s): HGBA1C in the last 72 hours. No results for input(s): CHOL, HDL, LDLCALC, TRIG, CHOLHDL, LDLDIRECT in the last 72 hours. No results for input(s): TSH, T4TOTAL, T3FREE, THYROIDAB in the last 72 hours.  Invalid input(s): FREET3  Recent Labs  12/26/14 0925  VITAMINB12 115*  FOLATE 19.2  FERRITIN 424*  TIBC 189*  IRON 38*  RETICCTPCT 3.5*   No results for input(s): LIPASE, AMYLASE in the last 72 hours.  Urine Studies No results for input(s): UHGB, CRYS in the last 72 hours.  Invalid input(s): UACOL, UAPR, USPG, UPH, UTP, UGL, UKET, UBIL, UNIT, UROB, ULEU, UEPI, UWBC, URBC, UBAC, CAST, UCOM, BILUA  MICROBIOLOGY: Recent Results (from the past 240 hour(s))  Blood culture (routine x 2)     Status: None (Preliminary result)   Collection Time: 12/24/14  9:15 PM  Result Value Ref Range Status   Specimen Description BLOOD RIGHT ANTECUBITAL  Final   Special Requests BOTTLES DRAWN AEROBIC AND ANAEROBIC 5ML  Final   Culture   Final           BLOOD CULTURE RECEIVED NO GROWTH TO DATE CULTURE WILL BE HELD FOR 5 DAYS BEFORE ISSUING A FINAL NEGATIVE REPORT Performed at [facility]    Report Status PENDING  Incomplete  Blood culture (routine x 2)     Status: None (Preliminary result)   Collection Time: 12/24/14  9:40 PM  Result Value Ref Range Status   Specimen Description BLOOD RIGHT HAND  Final   Special Requests BOTTLES DRAWN AEROBIC AND ANAEROBIC 5CC   Final    Culture   Final           BLOOD CULTURE RECEIVED NO GROWTH TO DATE CULTURE WILL BE HELD FOR 5 DAYS BEFORE ISSUING A FINAL NEGATIVE REPORT Note: Culture results may be compromised due to an inadequate volume of blood received in culture bottles. Performed at [facility]    Report Status PENDING  Incomplete  Respiratory virus panel     Status: Abnormal   Collection Time: 12/25/14  2:05 AM  Result Value Ref Range Status   Source - RVPAN NOSE  Corrected   Respiratory Syncytial Virus A Negative Negative Final   Respiratory Syncytial Virus B Negative Negative Final   Influenza A Positive (A) Negative Final    Comment: Positive for Influenza A 2009 H1N1   Influenza B Positive (A) Negative Final   Parainfluenza 1 Negative Negative Final   Parainfluenza 2 Negative Negative Final   Parainfluenza 3 Negative Negative Final   Metapneumovirus Negative Negative Final   Rhinovirus Negative Negative Final   Adenovirus Negative Negative Final    Comment: (NOTE) Performed At: [facility] [address] [place], [place] [id number] Lindon Romp MD IO:[id number]     RADIOLOGY STUDIES/RESULTS: Ct Chest W Contrast  12/06/2014   CLINICAL DATA:  Subsequent treatment strategy for lung cancer diagnosed in December 2014. Pre metastasis.  EXAM: CT CHEST WITH CONTRAST  TECHNIQUE: Multidetector CT imaging of the chest was performed during intravenous contrast administration.  CONTRAST:  68mL OMNIPAQUE IOHEXOL 300 MG/ML  SOLN  COMPARISON:  CT 07/31/2014, PET-CT 03/16/2013  FINDINGS: Mediastinum/Nodes: No axillary or supraclavicular lymphadenopathy. No mediastinal lymphadenopathy. There is left peribronchial thickening (image 23, series 2). The thickening posterior to lingula bronchus measures 9 mm compared to 11 mm on prior.  Lungs/Pleura: Perihilar masslike thickening surrounds the left upper lobe bronchus (same lesion as above) and measure approximately 22 x 20 mm on lung window series Image  22. This is also similar to prior where lesion measured 25 x 24 mm on image 23, series 5, 07/31/2014  There is volume loss in the left hemi thorax. There is perihilar consolidation and bronchiectasis in the left lower lobe which is slightly improved compared prior. Interval improvement in the right left pleural effusion compared to prior.  No pulmonary nodules on the right  Upper abdomen: Thickening of the adrenal glands is unchanged comparison exam. Adrenal thickening was not hypermetabolic comparison PET-CT scan.  Musculoskeletal: No aggressive osseous lesion.  IMPRESSION: 1. No evidence of lung cancer progression. 2. Left perihilar masslike soft tissue thickening surrounding the central upper lobe bronchi is not changed. 3. Interval improvement in the left lower lobe consolidation and pleural fluid.   Electronically Signed   By: Suzy Bouchard M.D.   On: 12/06/2014 08:30   Ct Angio Chest Pe W/cm &/or Wo Cm  12/24/2014   CLINICAL DATA:  Palpitations since yesterday. Shortness of breath. Left lung cancer about 20 months ago with radiation and chemotherapy.  EXAM: CT ANGIOGRAPHY CHEST WITH CONTRAST  TECHNIQUE: Multidetector CT imaging of the chest was performed using the standard protocol during bolus administration of intravenous contrast. Multiplanar CT image reconstructions and MIPs were obtained to evaluate the vascular anatomy.  CONTRAST:  77mL OMNIPAQUE IOHEXOL 350 MG/ML SOLN  COMPARISON:  12/05/2014  FINDINGS: Technically adequate study with good opacification of the central and segmental pulmonary arteries. Filling defects are demonstrated in the baby right upper lobe anterior segmental pulmonary artery consistent with focal acute pulmonary embolus. This is new since the previous study. No large central pulmonary emboli. Ventricular measurements are limited but RV to LV ratio appears to be increased at 1.2. This may be artifactual due to motion artifact but right heart strain is not excluded.  Normal  heart size. Normal caliber thoracic aorta. No aortic dissection. Great vessel origins are patent. Calcification in the coronary arteries and aorta. Esophagus is decompressed.  There is diffuse scarring, consolidation, and volume loss in the left lung with masslike infiltration in the left hilum. This corresponds to the patient's numb carcinoma and likely represents combination of mass and postobstructive change with radiation changes. Degree of consolidation is slightly more prominent than on the prior study. Diffuse emphysematous changes throughout the aerated portions of both lungs. No pleural effusions. No pneumothorax.  Included portions of the upper abdominal organs are grossly unremarkable. Degenerative changes in the spine. Old left rib fractures. No destructive bone lesions.  Review of the MIP images confirms the above findings.  IMPRESSION: Positive examination for pulmonary embolus involving right upper lung segmental pulmonary artery. Heartbeat LV ratio is increased. This may be artifactual but right heart strain is not excluded. Left hilar mass with postobstructive change and radiation change. Mild progression of consolidation since prior study.  These results were called by telephone at the time of interpretation on 12/24/2014 at 9:24 pm to Dr. Sherwood Gambler , who verbally acknowledged these results.   Electronically Signed   By: Lucienne Capers M.D.   On: 12/24/2014 21:26   Dg Chest [facility]  1 View  12/24/2014   CLINICAL DATA:  sob onset today with a dry cough x 2 days and intermittent chest pain and palpitations x 1 week. States she also has had right foot swelling x 2 weeks. Hx of lung cancer, brain cancer, COPD, HTN and radiation therapy  EXAM: PORTABLE CHEST - 1 VIEW  COMPARISON:  CT 12/05/2014 and earlier studies  FINDINGS: Elevated left diaphragmatic leaflet. Consolidation/atelectasis in the left mid and lower lung. Right lung clear. Heart size difficult to assess due to adjacent opacity and  leftward mediastinal shift. Chronic blunting of the left lateral costophrenic angle. No pneumothorax. Visualized skeletal structures are unremarkable.  IMPRESSION: 1. Left volume loss and parenchymal changes as before, without acute or superimposed abnormality.   Electronically Signed   By: Lucrezia Europe M.D.   On: 12/24/2014 18:49    Oren Binet, MD  [facility] Pager:[phone]  If 7PM-7AM, please contact night-coverage [email or web address] Password [facility] 12/28/2014, 11:43 AM   LOS: 4 days

## 2014-12-28 NOTE — Progress Notes (Signed)
ANTICOAGULATION CONSULT NOTE - Follow Up Consult  Pharmacy Consult for Lovenox Indication: pulmonary embolus  Allergies  Allergen Reactions  . Penicillins Swelling    Patient Measurements: Height: 5\' 1"  (154.9 cm) Weight: 122 lb 12.7 oz (55.7 kg) IBW/kg (Calculated) : 47.8 Heparin Dosing Weight: 56.7 kg  Vital Signs: Temp: 97.5 F (36.4 C) (03/12 0652) Temp Source: Oral (03/12 0652) BP: 115/58 mmHg (03/12 1037) Pulse Rate: 102 (03/12 1037)  Labs:  Recent Labs  12/26/14 0507 12/26/14 1830 12/27/14 0555 12/28/14 0602  HGB 7.0* 11.7* 11.4* 9.7*  HCT 22.3* 36.0 35.3* 29.7*  PLT 153 170 169 179  CREATININE 0.44*  --  0.50 0.49*   Estimated Creatinine Clearance: 45.8 mL/min (by C-G formula based on Cr of 0.49).  Medications:  Scheduled:  . antiseptic oral rinse  7 mL Mouth Rinse BID  . aztreonam  1 g Intravenous 3 times per day  . cholecalciferol  400 Units Oral Daily  . enoxaparin (LOVENOX) injection  80 mg Subcutaneous Q24H  . levETIRAcetam  500 mg Oral BID  . metoprolol tartrate  12.5 mg Oral BID  . metronidazole  500 mg Intravenous Q8H  . oseltamivir  30 mg Oral BID  . pantoprazole  40 mg Oral QHS   Assessment: Chelsea Cruz presented to [facility] 3/8 with SOB and cough. Found to have a RUL PE. To start IV heparin for anticoagulation. Baseline H/H is slightly low but platelets are WNL. Hx of Craniotomy 11/08/14 for tumor, Neuro ok'd beginning Heparin for PE. Changed to Lovenox 80mg  q24 on 3/9, plan long-term chronic Lovenox.  One unit PRBC 3/11, Hgb 9.7, Plt wnl  Goal of Therapy:  Anti-Xa level 0.6-1 units/ml 4hrs after LMWH dose given Monitor platelets by anticoagulation protocol: Yes   Plan:   Continue Lovenox 80mg  SQ q24hr  CBC q72 hr  Minda Ditto PharmD Pager [phone] 12/28/2014, 11:19 AM

## 2014-12-29 DIAGNOSIS — A419 Sepsis, unspecified organism: Principal | ICD-10-CM

## 2014-12-29 LAB — CBC
HEMATOCRIT: 30.6 % — AB (ref 36.0–46.0)
Hemoglobin: 9.9 g/dL — ABNORMAL LOW (ref 12.0–15.0)
MCH: 29.3 pg (ref 26.0–34.0)
MCHC: 32.4 g/dL (ref 30.0–36.0)
MCV: 90.5 fL (ref 78.0–100.0)
Platelets: 201 10*3/uL (ref 150–400)
RBC: 3.38 MIL/uL — AB (ref 3.87–5.11)
RDW: 18 % — ABNORMAL HIGH (ref 11.5–15.5)
WBC: 4.8 10*3/uL (ref 4.0–10.5)

## 2014-12-29 MED ORDER — LEVOFLOXACIN 750 MG PO TABS
750.0000 mg | ORAL_TABLET | Freq: Every day | ORAL | Status: DC
  Administered 2014-12-29 – 2014-12-30 (×2): 750 mg via ORAL
  Filled 2014-12-29 (×3): qty 1

## 2014-12-29 NOTE — Progress Notes (Signed)
ANTIBIOTIC CONSULT NOTE   Pharmacy Consult for Levaquin po Indication: HCAP  Allergies  Allergen Reactions  . Penicillins Swelling   Patient Measurements: Height: 5\' 1"  (154.9 cm) Weight: 122 lb 12.7 oz (55.7 kg) IBW/kg (Calculated) : 47.8  Vital Signs: Temp: 97.5 F (36.4 C) (03/13 0407) Temp Source: Oral (03/13 0407) BP: 104/47 mmHg (03/13 0407) Pulse Rate: 97 (03/13 0407) Intake/Output from previous day: 03/12 0701 - 03/13 0700 In: 870 [P.O.:420; IV Piggyback:450] Out: 700 [Urine:700] Intake/Output from this shift:    Labs:  Recent Labs  12/27/14 0555 12/28/14 0602 12/29/14 0500  WBC 3.9* 4.0 4.8  HGB 11.4* 9.7* 9.9*  PLT 169 179 201  CREATININE 0.50 0.49*  --    Estimated Creatinine Clearance: 45.8 mL/min (by C-G formula based on Cr of 0.49). No results for input(s): VANCOTROUGH, VANCOPEAK, VANCORANDOM, GENTTROUGH, GENTPEAK, GENTRANDOM, TOBRATROUGH, TOBRAPEAK, TOBRARND, AMIKACINPEAK, AMIKACINTROU, AMIKACIN in the last 72 hours.   Microbiology: Recent Results (from the past 720 hour(s))  TECHNOLOGIST REVIEW     Status: None   Collection Time: 12/05/14  3:24 PM  Result Value Ref Range Status   Technologist Review 1% myelocyte  Final  Blood culture (routine x 2)     Status: None (Preliminary result)   Collection Time: 12/24/14  9:15 PM  Result Value Ref Range Status   Specimen Description BLOOD RIGHT ANTECUBITAL  Final   Special Requests BOTTLES DRAWN AEROBIC AND ANAEROBIC 5ML  Final   Culture   Final           BLOOD CULTURE RECEIVED NO GROWTH TO DATE CULTURE WILL BE HELD FOR 5 DAYS BEFORE ISSUING A FINAL NEGATIVE REPORT Performed at [facility]    Report Status PENDING  Incomplete  Blood culture (routine x 2)     Status: None (Preliminary result)   Collection Time: 12/24/14  9:40 PM  Result Value Ref Range Status   Specimen Description BLOOD RIGHT HAND  Final   Special Requests BOTTLES DRAWN AEROBIC AND ANAEROBIC 5CC   Final   Culture   Final            BLOOD CULTURE RECEIVED NO GROWTH TO DATE CULTURE WILL BE HELD FOR 5 DAYS BEFORE ISSUING A FINAL NEGATIVE REPORT Note: Culture results may be compromised due to an inadequate volume of blood received in culture bottles. Performed at [facility]    Report Status PENDING  Incomplete  Respiratory virus panel     Status: Abnormal   Collection Time: 12/25/14  2:05 AM  Result Value Ref Range Status   Source - RVPAN NOSE  Corrected   Respiratory Syncytial Virus A Negative Negative Final   Respiratory Syncytial Virus B Negative Negative Final   Influenza A Positive (A) Negative Final    Comment: Positive for Influenza A 2009 H1N1   Influenza B Positive (A) Negative Final   Parainfluenza 1 Negative Negative Final   Parainfluenza 2 Negative Negative Final   Parainfluenza 3 Negative Negative Final   Metapneumovirus Negative Negative Final   Rhinovirus Negative Negative Final   Adenovirus Negative Negative Final    Comment: (NOTE) Performed At: [facility] [place], [place] [id number] Lindon Romp MD VQ:[id number]    Assessment: HPI: HCAP: HTN, HLD, depression, COPD, GERD, lung ca with brain met patient with SOB. Vanc given at [facility].   Clearance close to 50 ml/min  3/9 >> Vanc >> 3/11 3/9 >> Aztreo >> 3/13 3/11 >> Flagyl(MD) >> 3/13 3/13 >> Levaquin  po >>  3/8 blood x2: ngtd 3/9 resp virus panel: positive for Infl A & B  3/9 legionella Ag: neg 3/9 Strep Ag: neg  Goal of Therapy:  Appropriate antibiotic dosing for renal function; eradication of infection  Plan:   Change Abx to Levaquin, may use po  Levaquin $RemoveB'750mg'[id number]$  po q24  Minda Ditto PharmD Pager [phone] 12/29/2014, 9:06 AM

## 2014-12-29 NOTE — Progress Notes (Signed)
PATIENT DETAILS Name: Chelsea Cruz Age: 74 y.o. Sex: female Date of Birth: 18-Apr-1941 Admit Date: 12/24/2014 Admitting Physician Ivor Costa, MD [facility]:[id number],OTLXB, MD  Subjective: Feels better. One episode of fever last night  Assessment/Plan: Principal Problem:   Pulmonary embolism: Admitted and started on intravenous heparin infusion. Discussed with Dr. Vertell Limber, since patient two months out from craniotomy, okay to be anticoagulated long term. Discussed case with Dr. Julien Nordmann, he recommends Lovenox long-term given active cancer. Subsequently started on Lovenox,  2-D echocardiogram shows preserved EF.  Lovenox teaching started by RN.  Active Problems:    Sepsis: likely secondary to post obstructive PNA and influenza. Blood cultures negative,  Resp Virus panel positive for both influenza A and influenza B. Although symptoms have been going on for a number of days, she has active malignancy, started on Tamiflu as she is high risk patient for complications. Initially on Vanco/Azactam/Flagyl-will now just place on Levaquin as clinically improved. Continues to have intermittent fever-suspect that is mostly related to lingering influenza, malignancy or VTE.      Suspected postobstructive pneumonia: Antibiotics as above. Improved clinically    Anemia:Secondary to chronic disease and acute illness. CBC stable. Required on unit of PRBC on 3/10     History of non-small cell lung cancer with brain metastases-status post craniotomy in January 2016: Spoke with Dr. Julien Nordmann, see above. Continue Keppra for seizure prophylaxis    Recurrent SVT: Monitor in telemetry. Continue low-dose metoprolol. Echocardiogram shows preserved ejection fraction     COPD: Appears stable. Lungs basically clear. Continue with as needed bronchodilators     GERD: Continue PPI.     Mildly elevated liver function: Follow periodically-LFTs are markedly improved.    Disposition: Remain inpatient- suspect SNF on  Monday  Antibiotics:  See below   Anti-infectives    Start     Dose/Rate Route Frequency Ordered Stop   12/27/14 2200  oseltamivir (TAMIFLU) capsule 30 mg     30 mg Oral 2 times daily 12/27/14 1903 01/01/15 2159   12/27/14 0900  metroNIDAZOLE (FLAGYL) IVPB 500 mg     500 mg 100 mL/hr over 60 Minutes Intravenous Every 8 hours 12/27/14 0829     12/25/14 1400  aztreonam (AZACTAM) 1 g in dextrose 5 % 50 mL IVPB     1 g 100 mL/hr over 30 Minutes Intravenous 3 times per day 12/25/14 0238     12/25/14 1200  vancomycin (VANCOCIN) 500 mg in sodium chloride 0.9 % 100 mL IVPB  Status:  Discontinued     500 mg 100 mL/hr over 60 Minutes Intravenous Every 12 hours 12/25/14 0238 12/27/14 0829   12/25/14 0600  aztreonam (AZACTAM) injection 1 g  Status:  Discontinued     1 g Intramuscular 3 times per day 12/25/14 0155 12/25/14 0226   12/25/14 0200  vancomycin (VANCOCIN) IVPB 750 mg/150 ml premix  Status:  Discontinued     750 mg 150 mL/hr over 60 Minutes Intravenous Every 8 hours 12/25/14 0155 12/25/14 0212   12/24/14 2315  aztreonam (AZACTAM) 2 g in dextrose 5 % 50 mL IVPB     2 g 100 mL/hr over 30 Minutes Intravenous  Once 12/24/14 2308 12/25/14 0330   12/24/14 2315  vancomycin (VANCOCIN) IVPB 1000 mg/200 mL premix     1,000 mg 200 mL/hr over 60 Minutes Intravenous  Once 12/24/14 2308 12/25/14 0050      DVT Prophylaxis: On therapeutic Lovenox  Code Status: Full code  Family Communication None at bedside  Procedures:  None  CONSULTS:  None  MEDICATIONS: Scheduled Meds: . antiseptic oral rinse  7 mL Mouth Rinse BID  . aztreonam  1 g Intravenous 3 times per day  . cholecalciferol  400 Units Oral Daily  . enoxaparin (LOVENOX) injection  80 mg Subcutaneous Q24H  . levETIRAcetam  500 mg Oral BID  . metoprolol tartrate  12.5 mg Oral BID  . metronidazole  500 mg Intravenous Q8H  . oseltamivir  30 mg Oral BID  . pantoprazole  40 mg Oral QHS   Continuous Infusions:   PRN  Meds:.acetaminophen, albuterol, calcium carbonate, chlorpheniramine-HYDROcodone, morphine injection, oxyCODONE    PHYSICAL EXAM: Vital signs in last 24 hours: Filed Vitals:   12/28/14 1340 12/28/14 2130 12/28/14 2247 12/29/14 0407  BP: 100/48 108/75  104/47  Pulse: 96 116  97  Temp: 97.9 F (36.6 C) 101.6 F (38.7 C) 99.3 F (37.4 C) 97.5 F (36.4 C)  TempSrc: Oral Oral Oral Oral  Resp: 18 20  24   Height:      Weight:      SpO2: 91% 90%  92%    Weight change:  Filed Weights   12/24/14 1804 12/25/14 0059  Weight: 56.7 kg (125 lb) 55.7 kg (122 lb 12.7 oz)   Body mass index is 23.21 kg/(m^2).   Gen Exam: Awake and alert with clear speech.  Not in any distress Neck: Supple, No JVD.   Chest: B/L Clear.  No rales or rhonchi CVS: S1 S2 Regular, no murmurs.  Abdomen: soft, BS +, non tender, non distended.  Extremities: no edema, lower extremities warm to touch. Neurologic: Non Focal.   Skin: No Rash.   Wounds: N/A.    Intake/Output from previous day:  Intake/Output Summary (Last 24 hours) at 12/29/14 0839 Last data filed at 12/29/14 3570  Gross per 24 hour  Intake    590 ml  Output    700 ml  Net   -110 ml     LAB RESULTS: CBC  Recent Labs Lab 12/24/14 1915  12/26/14 0507 12/26/14 1830 12/27/14 0555 12/28/14 0602 12/29/14 0500  WBC 4.6  < > 2.9* 4.6 3.9* 4.0 4.8  HGB 11.2*  < > 7.0* 11.7* 11.4* 9.7* 9.9*  HCT 35.2*  < > 22.3* 36.0 35.3* 29.7* 30.6*  PLT 175  < > 153 170 169 179 201  MCV 89.1  < > 89.2 90.7 90.1 89.7 90.5  MCH 28.4  < > 28.0 29.5 29.1 29.3 29.3  MCHC 31.8  < > 31.4 32.5 32.3 32.7 32.4  RDW 19.9*  < > 18.9* 17.2* 17.6* 17.7* 18.0*  LYMPHSABS 1.9  --   --   --   --   --   --   MONOABS 0.3  --   --   --   --   --   --   EOSABS 0.0  --   --   --   --   --   --   BASOSABS 0.0  --   --   --   --   --   --   < > = values in this interval not displayed.  Chemistries   Recent Labs Lab 12/24/14 1915 12/26/14 0500 12/26/14 0507  12/27/14 0555 12/28/14 0602  NA 137  --  139 139 136  K 3.9  --  3.1* 4.1 3.3*  CL 106  --  106 102 99  CO2 28  --  29  27 31  GLUCOSE 126*  --  91 99 99  BUN 22  --  9 12 11   CREATININE 0.53  --  0.44* 0.50 0.49*  CALCIUM 8.7  --  8.1* 8.7 8.3*  MG  --  1.6  --   --   --     CBG: No results for input(s): GLUCAP in the last 168 hours.  GFR Estimated Creatinine Clearance: 45.8 mL/min (by C-G formula based on Cr of 0.49).  Coagulation profile  Recent Labs Lab 12/25/14 0236  INR 1.27    Cardiac Enzymes  Recent Labs Lab 12/24/14 1915  TROPONINI 0.03    Invalid input(s): POCBNP No results for input(s): DDIMER in the last 72 hours. No results for input(s): HGBA1C in the last 72 hours. No results for input(s): CHOL, HDL, LDLCALC, TRIG, CHOLHDL, LDLDIRECT in the last 72 hours. No results for input(s): TSH, T4TOTAL, T3FREE, THYROIDAB in the last 72 hours.  Invalid input(s): FREET3  Recent Labs  12/26/14 0925  VITAMINB12 115*  FOLATE 19.2  FERRITIN 424*  TIBC 189*  IRON 38*  RETICCTPCT 3.5*   No results for input(s): LIPASE, AMYLASE in the last 72 hours.  Urine Studies No results for input(s): UHGB, CRYS in the last 72 hours.  Invalid input(s): UACOL, UAPR, USPG, UPH, UTP, UGL, UKET, UBIL, UNIT, UROB, ULEU, UEPI, UWBC, URBC, UBAC, CAST, UCOM, BILUA  MICROBIOLOGY: Recent Results (from the past 240 hour(s))  Blood culture (routine x 2)     Status: None (Preliminary result)   Collection Time: 12/24/14  9:15 PM  Result Value Ref Range Status   Specimen Description BLOOD RIGHT ANTECUBITAL  Final   Special Requests BOTTLES DRAWN AEROBIC AND ANAEROBIC 5ML  Final   Culture   Final           BLOOD CULTURE RECEIVED NO GROWTH TO DATE CULTURE WILL BE HELD FOR 5 DAYS BEFORE ISSUING A FINAL NEGATIVE REPORT Performed at [facility]    Report Status PENDING  Incomplete  Blood culture (routine x 2)     Status: None (Preliminary result)   Collection Time:  12/24/14  9:40 PM  Result Value Ref Range Status   Specimen Description BLOOD RIGHT HAND  Final   Special Requests BOTTLES DRAWN AEROBIC AND ANAEROBIC 5CC   Final   Culture   Final           BLOOD CULTURE RECEIVED NO GROWTH TO DATE CULTURE WILL BE HELD FOR 5 DAYS BEFORE ISSUING A FINAL NEGATIVE REPORT Note: Culture results may be compromised due to an inadequate volume of blood received in culture bottles. Performed at [facility]    Report Status PENDING  Incomplete  Respiratory virus panel     Status: Abnormal   Collection Time: 12/25/14  2:05 AM  Result Value Ref Range Status   Source - RVPAN NOSE  Corrected   Respiratory Syncytial Virus A Negative Negative Final   Respiratory Syncytial Virus B Negative Negative Final   Influenza A Positive (A) Negative Final    Comment: Positive for Influenza A 2009 H1N1   Influenza B Positive (A) Negative Final   Parainfluenza 1 Negative Negative Final   Parainfluenza 2 Negative Negative Final   Parainfluenza 3 Negative Negative Final   Metapneumovirus Negative Negative Final   Rhinovirus Negative Negative Final   Adenovirus Negative Negative Final    Comment: (NOTE) Performed At: [facility] [place], [place] [id number] Lindon Romp MD RK:[id number]  RADIOLOGY STUDIES/RESULTS: Ct Chest W Contrast  12/06/2014   CLINICAL DATA:  Subsequent treatment strategy for lung cancer diagnosed in December 2014. Pre metastasis.  EXAM: CT CHEST WITH CONTRAST  TECHNIQUE: Multidetector CT imaging of the chest was performed during intravenous contrast administration.  CONTRAST:  80mL OMNIPAQUE IOHEXOL 300 MG/ML  SOLN  COMPARISON:  CT 07/31/2014, PET-CT 03/16/2013  FINDINGS: Mediastinum/Nodes: No axillary or supraclavicular lymphadenopathy. No mediastinal lymphadenopathy. There is left peribronchial thickening (image 23, series 2). The thickening posterior to lingula bronchus measures 9 mm compared to 11 mm on prior.   Lungs/Pleura: Perihilar masslike thickening surrounds the left upper lobe bronchus (same lesion as above) and measure approximately 22 x 20 mm on lung window series Image 22. This is also similar to prior where lesion measured 25 x 24 mm on image 23, series 5, 07/31/2014  There is volume loss in the left hemi thorax. There is perihilar consolidation and bronchiectasis in the left lower lobe which is slightly improved compared prior. Interval improvement in the right left pleural effusion compared to prior.  No pulmonary nodules on the right  Upper abdomen: Thickening of the adrenal glands is unchanged comparison exam. Adrenal thickening was not hypermetabolic comparison PET-CT scan.  Musculoskeletal: No aggressive osseous lesion.  IMPRESSION: 1. No evidence of lung cancer progression. 2. Left perihilar masslike soft tissue thickening surrounding the central upper lobe bronchi is not changed. 3. Interval improvement in the left lower lobe consolidation and pleural fluid.   Electronically Signed   By: Suzy Bouchard M.D.   On: 12/06/2014 08:30   Ct Angio Chest Pe W/cm &/or Wo Cm  12/24/2014   CLINICAL DATA:  Palpitations since yesterday. Shortness of breath. Left lung cancer about 20 months ago with radiation and chemotherapy.  EXAM: CT ANGIOGRAPHY CHEST WITH CONTRAST  TECHNIQUE: Multidetector CT imaging of the chest was performed using the standard protocol during bolus administration of intravenous contrast. Multiplanar CT image reconstructions and MIPs were obtained to evaluate the vascular anatomy.  CONTRAST:  57mL OMNIPAQUE IOHEXOL 350 MG/ML SOLN  COMPARISON:  12/05/2014  FINDINGS: Technically adequate study with good opacification of the central and segmental pulmonary arteries. Filling defects are demonstrated in the baby right upper lobe anterior segmental pulmonary artery consistent with focal acute pulmonary embolus. This is new since the previous study. No large central pulmonary emboli. Ventricular  measurements are limited but RV to LV ratio appears to be increased at 1.2. This may be artifactual due to motion artifact but right heart strain is not excluded.  Normal heart size. Normal caliber thoracic aorta. No aortic dissection. Great vessel origins are patent. Calcification in the coronary arteries and aorta. Esophagus is decompressed.  There is diffuse scarring, consolidation, and volume loss in the left lung with masslike infiltration in the left hilum. This corresponds to the patient's numb carcinoma and likely represents combination of mass and postobstructive change with radiation changes. Degree of consolidation is slightly more prominent than on the prior study. Diffuse emphysematous changes throughout the aerated portions of both lungs. No pleural effusions. No pneumothorax.  Included portions of the upper abdominal organs are grossly unremarkable. Degenerative changes in the spine. Old left rib fractures. No destructive bone lesions.  Review of the MIP images confirms the above findings.  IMPRESSION: Positive examination for pulmonary embolus involving right upper lung segmental pulmonary artery. Heartbeat LV ratio is increased. This may be artifactual but right heart strain is not excluded. Left hilar mass with postobstructive change and radiation change. Mild  progression of consolidation since prior study.  These results were called by telephone at the time of interpretation on 12/24/2014 at 9:24 pm to Dr. Sherwood Gambler , who verbally acknowledged these results.   Electronically Signed   By: Lucienne Capers M.D.   On: 12/24/2014 21:26   Dg Chest Port 1 View  12/24/2014   CLINICAL DATA:  sob onset today with a dry cough x 2 days and intermittent chest pain and palpitations x 1 week. States she also has had right foot swelling x 2 weeks. Hx of lung cancer, brain cancer, COPD, HTN and radiation therapy  EXAM: PORTABLE CHEST - 1 VIEW  COMPARISON:  CT 12/05/2014 and earlier studies  FINDINGS:  Elevated left diaphragmatic leaflet. Consolidation/atelectasis in the left mid and lower lung. Right lung clear. Heart size difficult to assess due to adjacent opacity and leftward mediastinal shift. Chronic blunting of the left lateral costophrenic angle. No pneumothorax. Visualized skeletal structures are unremarkable.  IMPRESSION: 1. Left volume loss and parenchymal changes as before, without acute or superimposed abnormality.   Electronically Signed   By: Lucrezia Europe M.D.   On: 12/24/2014 18:49    Oren Binet, MD  [facility] Pager:[phone]  If 7PM-7AM, please contact night-coverage [email or web address] Password [facility] 12/29/2014, 8:39 AM   LOS: 5 days

## 2014-12-30 ENCOUNTER — Inpatient Hospital Stay (HOSPITAL_COMMUNITY): Payer: Medicare Other

## 2014-12-30 DIAGNOSIS — J418 Mixed simple and mucopurulent chronic bronchitis: Secondary | ICD-10-CM

## 2014-12-30 DIAGNOSIS — J189 Pneumonia, unspecified organism: Secondary | ICD-10-CM | POA: Insufficient documentation

## 2014-12-30 LAB — BRAIN NATRIURETIC PEPTIDE: B Natriuretic Peptide: 453.8 pg/mL — ABNORMAL HIGH (ref 0.0–100.0)

## 2014-12-30 MED ORDER — ALBUTEROL SULFATE (2.5 MG/3ML) 0.083% IN NEBU: 2.5000 mg | INHALATION_SOLUTION | RESPIRATORY_TRACT | Status: DC | PRN

## 2014-12-30 MED ORDER — LEVOFLOXACIN 750 MG PO TABS: 750.0000 mg | ORAL_TABLET | Freq: Every day | ORAL | Status: DC

## 2014-12-30 MED ORDER — GUAIFENESIN ER 600 MG PO TB12: 600.0000 mg | ORAL_TABLET | Freq: Two times a day (BID) | ORAL | Status: DC

## 2014-12-30 MED ORDER — OSELTAMIVIR PHOSPHATE 30 MG PO CAPS: 30.0000 mg | ORAL_CAPSULE | Freq: Two times a day (BID) | ORAL | Status: DC

## 2014-12-30 MED ORDER — METOPROLOL TARTRATE 25 MG PO TABS: 25.0000 mg | ORAL_TABLET | Freq: Two times a day (BID) | ORAL | Status: DC

## 2014-12-30 MED ORDER — ENOXAPARIN SODIUM 80 MG/0.8ML ~~LOC~~ SOLN: 80.0000 mg | SUBCUTANEOUS | Status: DC

## 2014-12-30 MED ORDER — HYDROCOD POLST-CHLORPHEN POLST 10-8 MG/5ML PO LQCR: 5.0000 mL | Freq: Two times a day (BID) | ORAL | Status: DC | PRN

## 2014-12-30 NOTE — Progress Notes (Signed)
Patient unable to void throughout shift.  Bladder scanner showed over 567ml of urine in bladder.  Patient placed on bedside commode and voided 150 ml of amber, malodorous urine.  MD notified.  Told to encourage patient to void again in another hour and bladder scan then.  Will pass on to night RN.

## 2014-12-30 NOTE — Progress Notes (Signed)
Pt complaining of SOB and received report from PT that patient O2 was 87% on 2L.  Titrated O2 to 4L and patient O2 at 94%.  Patient also stated she could hardly walk today.  Received same report from PT.  MD notified; orders for stat CXR received.

## 2014-12-30 NOTE — Progress Notes (Addendum)
Patient is set to discharge to [facility] today. Patient & daughter, Kieth Brightly aware. Discharge packet given to RN, Elzie Rings. [facility] called for transport.   Clinical Social Work Department CLINICAL SOCIAL WORK PLACEMENT NOTE 12/31/2014  Patient:  Chelsea Cruz, Chelsea Cruz  Account Number:  [id number] Admit date:  12/24/2014  Clinical Social Worker:  Renold Genta  Date/time:  12/27/2014 04:17 PM  Clinical Social Work is seeking post-discharge placement for this patient at the following level of care:   SKILLED NURSING   (*CSW will update this form in Epic as items are completed)   12/27/2014  Patient/family provided with [place] Department of Clinical Social Work's list of facilities offering this level of care within the geographic area requested by the patient (or if unable, by the patient's family).  12/27/2014  Patient/family informed of their freedom to choose among providers that offer the needed level of care, that participate in Medicare, Medicaid or managed care program needed by the patient, have an available bed and are willing to accept the patient.  12/27/2014  Patient/family informed of [facility]' ownership interest in [facility], as well as of the fact that they are under no obligation to receive care at this facility.  PASARR submitted to EDS on 12/27/2014 PASARR number received on 12/27/2014  FL2 transmitted to all facilities in geographic area requested by pt/family on  12/27/2014 FL2 transmitted to all facilities within larger geographic area on   Patient informed that his/her managed care company has contracts with or will negotiate with  certain facilities, including the following:     Patient/family informed of bed offers received:  12/27/2014 Patient chooses bed at [facility] Physician recommends and patient chooses bed at    Patient to be transferred to [facility] on  12/31/2014 Patient to be transferred to facility by  [facility] Patient and family notified of transfer on 12/31/2014 Name of family member notified:  patient's daughter, Kieth Brightly  The following physician request were entered in Epic:   Additional Comments:    Raynaldo Opitz, [place] Social Worker cell #: [phone]

## 2014-12-30 NOTE — Progress Notes (Signed)
PATIENT DETAILS Name: Chelsea Cruz Age: 74 y.o. Sex: female Date of Birth: 1941/05/05 Admit Date: 12/24/2014 Admitting Physician Ivor Costa, MD YIR:SWNIOEV,OJJKK, MD  Subjective: Feels better.   Assessment/Plan: Principal Problem:   Pulmonary embolism: Admitted and started on intravenous heparin infusion. Discussed with Dr. Vertell Limber, since patient two months out from craniotomy, okay to be anticoagulated long term. Discussed case with Dr. Julien Nordmann, he recommends Lovenox long-term given active cancer. Subsequently started on Lovenox,  2-D echocardiogram shows preserved EF.  Lovenox teaching started by RN.  Active Problems:    Sepsis: likely secondary to post obstructive PNA and influenza. Blood cultures negative,  Resp Virus panel positive for both influenza A and influenza B. Although symptoms have been going on for a number of days, she has active malignancy, started on Tamiflu as she is high risk patient for complications. Initially on Vanco/Azactam/Flagyl- now just on Levaquin as clinically improved.      Suspected postobstructive pneumonia: Antibiotics as above. Improved clinically    Anemia:Secondary to chronic disease and acute illness. CBC stable. Required on unit of PR[facility] on 3/10     History of non-small cell lung cancer with brain metastases-status post craniotomy in January 2016: Spoke with Dr. Julien Nordmann, see above. Continue Keppra for seizure prophylaxis    Recurrent SVT/Aflutter: Monitor in telemetry. Continues to have tachycardia mostly on exertion-BP soft-unable to go up on the dose of metoprolol. Will ask cardiology to evaluate. Continue low-dose metoprolol. Echocardiogram shows preserved ejection fraction     COPD: Appears stable. Lungs basically clear. Continue with as needed bronchodilators     GERD: Continue PPI.     Mildly elevated liver function: Follow periodically-LFTs are markedly improved.    Disposition: Remain inpatient- suspect SNF on  Monday  Antibiotics:  See below   Anti-infectives    Start     Dose/Rate Route Frequency Ordered Stop   12/30/14 0000  levofloxacin (LEVAQUIN) 750 MG tablet     750 mg Oral Daily 12/30/14 0810     12/30/14 0000  oseltamivir (TAMIFLU) 30 MG capsule     30 mg Oral 2 times daily 12/30/14 0810     12/29/14 1000  levofloxacin (LEVAQUIN) tablet 750 mg     750 mg Oral Daily 12/29/14 0907     12/27/14 2200  oseltamivir (TAMIFLU) capsule 30 mg     30 mg Oral 2 times daily 12/27/14 1903 01/01/15 2159   12/27/14 0900  metroNIDAZOLE (FLAGYL) IVPB 500 mg  Status:  Discontinued     500 mg 100 mL/hr over 60 Minutes Intravenous Every 8 hours 12/27/14 0829 12/29/14 0840   12/25/14 1400  aztreonam (AZACTAM) 1 g in dextrose 5 % 50 mL IVPB  Status:  Discontinued     1 g 100 mL/hr over 30 Minutes Intravenous 3 times per day 12/25/14 0238 12/29/14 0840   12/25/14 1200  vancomycin (VANCOCIN) 500 mg in sodium chloride 0.9 % 100 mL IVPB  Status:  Discontinued     500 mg 100 mL/hr over 60 Minutes Intravenous Every 12 hours 12/25/14 0238 12/27/14 0829   12/25/14 0600  aztreonam (AZACTAM) injection 1 g  Status:  Discontinued     1 g Intramuscular 3 times per day 12/25/14 0155 12/25/14 0226   12/25/14 0200  vancomycin (VANCOCIN) IVPB 750 mg/150 ml premix  Status:  Discontinued     750 mg 150 mL/hr over 60 Minutes Intravenous Every 8 hours 12/25/14 0155 12/25/14 0212   12/24/14  2315  aztreonam (AZACTAM) 2 g in dextrose 5 % 50 mL IVPB     2 g 100 mL/hr over 30 Minutes Intravenous  Once 12/24/14 2308 12/25/14 0330   12/24/14 2315  vancomycin (VANCOCIN) IVPB 1000 mg/200 mL premix     1,000 mg 200 mL/hr over 60 Minutes Intravenous  Once 12/24/14 2308 12/25/14 0050      DVT Prophylaxis: On therapeutic Lovenox  Code Status: Full code   Family Communication None at bedside  Procedures:  None  CONSULTS:  None  MEDICATIONS: Scheduled Meds: . antiseptic oral rinse  7 mL Mouth Rinse BID  .  cholecalciferol  400 Units Oral Daily  . enoxaparin (LOVENOX) injection  80 mg Subcutaneous Q24H  . levETIRAcetam  500 mg Oral BID  . levofloxacin  750 mg Oral Daily  . metoprolol tartrate  12.5 mg Oral BID  . oseltamivir  30 mg Oral BID  . pantoprazole  40 mg Oral QHS   Continuous Infusions:   PRN Meds:.acetaminophen, albuterol, calcium carbonate, chlorpheniramine-HYDROcodone, morphine injection, oxyCODONE    PHYSICAL EXAM: Vital signs in last 24 hours: Filed Vitals:   12/30/14 0442 12/30/14 1120 12/30/14 1125 12/30/14 1131  BP: 107/78     Pulse: 100     Temp: 98.2 F (36.8 C)     TempSrc: Oral     Resp: 16     Height:      Weight:      SpO2: 93% 88% 88% 95%    Weight change:  Filed Weights   12/24/14 1804 12/25/14 0059  Weight: 56.7 kg (125 lb) 55.7 kg (122 lb 12.7 oz)   Body mass index is 23.21 kg/(m^2).   Gen Exam: Awake and alert with clear speech.  Not in any distress Neck: Supple, No JVD.   Chest: B/L Clear.  No rales or rhonchi CVS: S1 S2 Regular, no murmurs.  Abdomen: soft, BS +, non tender, non distended.  Extremities: no edema, lower extremities warm to touch. Neurologic: Non Focal.   Skin: No Rash.   Wounds: N/A.    Intake/Output from previous day:  Intake/Output Summary (Last 24 hours) at 12/30/14 1440 Last data filed at 12/30/14 0459  Gross per 24 hour  Intake      0 ml  Output    175 ml  Net   -175 ml     LAB RESULTS: CBC  Recent Labs Lab 12/24/14 1915  12/26/14 0507 12/26/14 1830 12/27/14 0555 12/28/14 0602 12/29/14 0500  WBC 4.6  < > 2.9* 4.6 3.9* 4.0 4.8  HGB 11.2*  < > 7.0* 11.7* 11.4* 9.7* 9.9*  HCT 35.2*  < > 22.3* 36.0 35.3* 29.7* 30.6*  PLT 175  < > 153 170 169 179 201  MCV 89.1  < > 89.2 90.7 90.1 89.7 90.5  MCH 28.4  < > 28.0 29.5 29.1 29.3 29.3  MCHC 31.8  < > 31.4 32.5 32.3 32.7 32.4  RDW 19.9*  < > 18.9* 17.2* 17.6* 17.7* 18.0*  LYMPHSABS 1.9  --   --   --   --   --   --   MONOABS 0.3  --   --   --   --   --   --    EOSABS 0.0  --   --   --   --   --   --   BASOSABS 0.0  --   --   --   --   --   --   < > =  values in this interval not displayed.  Chemistries   Recent Labs Lab 12/24/14 1915 12/26/14 0500 12/26/14 0507 12/27/14 0555 12/28/14 0602  NA 137  --  139 139 136  K 3.9  --  3.1* 4.1 3.3*  CL 106  --  106 102 99  CO2 28  --  29 27 31   GLUCOSE 126*  --  91 99 99  BUN 22  --  9 12 11   CREATININE 0.53  --  0.44* 0.50 0.49*  CALCIUM 8.7  --  8.1* 8.7 8.3*  MG  --  1.6  --   --   --     CBG: No results for input(s): GLUCAP in the last 168 hours.  GFR Estimated Creatinine Clearance: 45.8 mL/min (by C-G formula based on Cr of 0.49).  Coagulation profile  Recent Labs Lab 12/25/14 0236  INR 1.27    Cardiac Enzymes  Recent Labs Lab 12/24/14 1915  TROPONINI 0.03    Invalid input(s): POCBNP No results for input(s): DDIMER in the last 72 hours. No results for input(s): HGBA1C in the last 72 hours. No results for input(s): CHOL, HDL, LDLCALC, TRIG, CHOLHDL, LDLDIRECT in the last 72 hours. No results for input(s): TSH, T4TOTAL, T3FREE, THYROIDAB in the last 72 hours.  Invalid input(s): FREET3 No results for input(s): VITAMINB12, FOLATE, FERRITIN, TIBC, IRON, RETICCTPCT in the last 72 hours. No results for input(s): LIPASE, AMYLASE in the last 72 hours.  Urine Studies No results for input(s): UHGB, CRYS in the last 72 hours.  Invalid input(s): UACOL, UAPR, USPG, UPH, UTP, UGL, UKET, UBIL, UNIT, UROB, ULEU, UEPI, UWBC, URBC, UBAC, CAST, UCOM, BILUA  MICROBIOLOGY: Recent Results (from the past 240 hour(s))  Blood culture (routine x 2)     Status: None (Preliminary result)   Collection Time: 12/24/14  9:15 PM  Result Value Ref Range Status   Specimen Description BLOOD RIGHT ANTECUBITAL  Final   Special Requests BOTTLES DRAWN AEROBIC AND ANAEROBIC 5ML  Final   Culture   Final           BLOOD CULTURE RECEIVED NO GROWTH TO DATE CULTURE WILL BE HELD FOR 5 DAYS BEFORE ISSUING  A FINAL NEGATIVE REPORT Performed at [facility]    Report Status PENDING  Incomplete  Blood culture (routine x 2)     Status: None (Preliminary result)   Collection Time: 12/24/14  9:40 PM  Result Value Ref Range Status   Specimen Description BLOOD RIGHT HAND  Final   Special Requests BOTTLES DRAWN AEROBIC AND ANAEROBIC 5CC   Final   Culture   Final           BLOOD CULTURE RECEIVED NO GROWTH TO DATE CULTURE WILL BE HELD FOR 5 DAYS BEFORE ISSUING A FINAL NEGATIVE REPORT Note: Culture results may be compromised due to an inadequate volume of blood received in culture bottles. Performed at [facility]    Report Status PENDING  Incomplete  Respiratory virus panel     Status: Abnormal   Collection Time: 12/25/14  2:05 AM  Result Value Ref Range Status   Source - RVPAN NOSE  Corrected   Respiratory Syncytial Virus A Negative Negative Final   Respiratory Syncytial Virus B Negative Negative Final   Influenza A Positive (A) Negative Final    Comment: Positive for Influenza A 2009 H1N1   Influenza B Positive (A) Negative Final   Parainfluenza 1 Negative Negative Final   Parainfluenza 2 Negative Negative Final  Parainfluenza 3 Negative Negative Final   Metapneumovirus Negative Negative Final   Rhinovirus Negative Negative Final   Adenovirus Negative Negative Final    Comment: (NOTE) Performed At: [facility] [address] [place], [place] [id number] Lindon Romp MD TG:[id number]     [id number] STUDIES/RESULTS: Ct Chest W Contrast  12/06/2014   CLINICAL DATA:  Subsequent treatment strategy for lung cancer diagnosed in December 2014. Pre metastasis.  EXAM: CT CHEST WITH CONTRAST  TECHNIQUE: Multidetector CT imaging of the chest was performed during intravenous contrast administration.  CONTRAST:  37mL OMNIPAQUE IOHEXOL 300 MG/ML  SOLN  COMPARISON:  CT 07/31/2014, PET-CT 03/16/2013  FINDINGS: Mediastinum/Nodes: No axillary or supraclavicular lymphadenopathy.  No mediastinal lymphadenopathy. There is left peribronchial thickening (image 23, series 2). The thickening posterior to lingula bronchus measures 9 mm compared to 11 mm on prior.  Lungs/Pleura: Perihilar masslike thickening surrounds the left upper lobe bronchus (same lesion as above) and measure approximately 22 x 20 mm on lung window series Image 22. This is also similar to prior where lesion measured 25 x 24 mm on image 23, series 5, 07/31/2014  There is volume loss in the left hemi thorax. There is perihilar consolidation and bronchiectasis in the left lower lobe which is slightly improved compared prior. Interval improvement in the right left pleural effusion compared to prior.  No pulmonary nodules on the right  Upper abdomen: Thickening of the adrenal glands is unchanged comparison exam. Adrenal thickening was not hypermetabolic comparison PET-CT scan.  Musculoskeletal: No aggressive osseous lesion.  IMPRESSION: 1. No evidence of lung cancer progression. 2. Left perihilar masslike soft tissue thickening surrounding the central upper lobe bronchi is not changed. 3. Interval improvement in the left lower lobe consolidation and pleural fluid.   Electronically Signed   By: Suzy Bouchard M.D.   On: 12/06/2014 08:30   Ct Angio Chest Pe W/cm &/or Wo Cm  12/24/2014   CLINICAL DATA:  Palpitations since yesterday. Shortness of breath. Left lung cancer about 20 months ago with radiation and chemotherapy.  EXAM: CT ANGIOGRAPHY CHEST WITH CONTRAST  TECHNIQUE: Multidetector CT imaging of the chest was performed using the standard protocol during bolus administration of intravenous contrast. Multiplanar CT image reconstructions and MIPs were obtained to evaluate the vascular anatomy.  CONTRAST:  74mL OMNIPAQUE IOHEXOL 350 MG/ML SOLN  COMPARISON:  12/05/2014  FINDINGS: Technically adequate study with good opacification of the central and segmental pulmonary arteries. Filling defects are demonstrated in the baby right  upper lobe anterior segmental pulmonary artery consistent with focal acute pulmonary embolus. This is new since the previous study. No large central pulmonary emboli. Ventricular measurements are limited but RV to LV ratio appears to be increased at 1.2. This may be artifactual due to motion artifact but right heart strain is not excluded.  Normal heart size. Normal caliber thoracic aorta. No aortic dissection. Great vessel origins are patent. Calcification in the coronary arteries and aorta. Esophagus is decompressed.  There is diffuse scarring, consolidation, and volume loss in the left lung with masslike infiltration in the left hilum. This corresponds to the patient's numb carcinoma and likely represents combination of mass and postobstructive change with radiation changes. Degree of consolidation is slightly more prominent than on the prior study. Diffuse emphysematous changes throughout the aerated portions of both lungs. No pleural effusions. No pneumothorax.  Included portions of the upper abdominal organs are grossly unremarkable. Degenerative changes in the spine. Old left rib fractures. No destructive bone lesions.  Review  of the MIP images confirms the above findings.  IMPRESSION: Positive examination for pulmonary embolus involving right upper lung segmental pulmonary artery. Heartbeat LV ratio is increased. This may be artifactual but right heart strain is not excluded. Left hilar mass with postobstructive change and radiation change. Mild progression of consolidation since prior study.  These results were called by telephone at the time of interpretation on 12/24/2014 at 9:24 pm to Dr. Sherwood Gambler , who verbally acknowledged these results.   Electronically Signed   By: Lucienne Capers M.D.   On: 12/24/2014 21:26   Dg Chest Port 1 View  12/30/2014   CLINICAL DATA:  Shortness of breath and cough  EXAM: PORTABLE CHEST - 1 VIEW  COMPARISON:  12/24/2014  FINDINGS: Cardiac shadow is stable. Volume loss  is again seen in the left base similar to that noted on the prior exam related to the patient's given clinical history. The right lung remains well aerated although increased interstitial changes are seen. No focal confluent infiltrate is noted.  IMPRESSION: Stable changes in the left base.  Increasing interstitial change in the right lung which may be related to a component of edema.   Electronically Signed   By: Inez Catalina M.D.   On: 12/30/2014 14:35   Dg Chest Port 1 View  12/24/2014   CLINICAL DATA:  sob onset today with a dry cough x 2 days and intermittent chest pain and palpitations x 1 week. States she also has had right foot swelling x 2 weeks. Hx of lung cancer, brain cancer, COPD, HTN and radiation therapy  EXAM: PORTABLE CHEST - 1 VIEW  COMPARISON:  CT 12/05/2014 and earlier studies  FINDINGS: Elevated left diaphragmatic leaflet. Consolidation/atelectasis in the left mid and lower lung. Right lung clear. Heart size difficult to assess due to adjacent opacity and leftward mediastinal shift. Chronic blunting of the left lateral costophrenic angle. No pneumothorax. Visualized skeletal structures are unremarkable.  IMPRESSION: 1. Left volume loss and parenchymal changes as before, without acute or superimposed abnormality.   Electronically Signed   By: Lucrezia Europe M.D.   On: 12/24/2014 18:49    Oren Binet, MD  [facility] Pager:[phone]  If 7PM-7AM, please contact night-coverage [email or web address] Password [email or web address] 12/30/2014, 2:40 PM   LOS: 6 days

## 2014-12-30 NOTE — Discharge Summary (Addendum)
PATIENT DETAILS Name: Chelsea Cruz Age: 74 y.o. Sex: female Date of Birth: [birth year] MRN: [id number]. Admitting Physician: Lorretta Harp, MD SDH:[id number],DDPOL, MD  Admit Date: 12/24/2014 Discharge date: 12/31/2014  Recommendations for Outpatient Follow-up:  1. New Medication-THERAPEUTIC LOVENOX- for Pul Embolism  2. Please check CBC/BMET in 1 week. 3. Taper off oxygen as tolerated   PRIMARY DISCHARGE DIAGNOSIS:  Principal Problem:   Pulmonary embolism Active Problems:   Hypertension   Primary cancer of left lower lobe of lung   Hypercholesteremia   Solitary Left Frontal 3.4 cm Brain Metastasis   Metastasis to brain   GERD (gastroesophageal reflux disease)   COPD (chronic obstructive pulmonary disease)   Abnormal liver function   PNA (pneumonia)      PAST MEDICAL HISTORY: Past Medical History  Diagnosis Date  . Nodule of left lung   . Hypertension   . Nicotine addiction   . Osteopenia   . Osteomalacia   . Impaired fasting glucose   . Hypercholesteremia   . Depression   . COPD (chronic obstructive pulmonary disease)   . GERD (gastroesophageal reflux disease)   . Lung cancer dx'd 02/2013  . 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 Qty: 75 mL, Refills: 12    chlorpheniramine-HYDROcodone (TUSSIONEX) 10-8 MG/5ML LQCR Take 5 mLs by mouth every 12 (twelve) hours as needed for cough. Qty: 115 mL, Refills: 0    enoxaparin (LOVENOX) 80 MG/0.8ML injection Inject 0.8 mLs (80 mg total) into the skin daily. Qty: 22.4 Syringe    guaiFENesin (MUCINEX) 600 MG 12 hr tablet Take 1 tablet (600 mg total) by mouth 2 (two) times daily.    levofloxacin (LEVAQUIN) 750 MG  tablet Take 1 tablet (750 mg total) by mouth daily. 2 more days from 12/29/13    metoprolol tartrate (LOPRESSOR) 25 MG tablet Take 1 tablet (25 mg total) by mouth 2 (two) times daily.    oseltamivir (TAMIFLU) 30 MG capsule Take 1 capsule (30 mg total) by mouth 2 (two) times daily. For 2 MORE DOSES AND THEN DISCONTINUE      CONTINUE these medications which have NOT CHANGED   Details  acetaminophen (TYLENOL) 325 MG tablet Take 650 mg by mouth every 6 (six) hours as needed (for pain.).    calcium carbonate (TUMS - DOSED IN MG ELEMENTAL CALCIUM) 500 MG chewable tablet Chew 2 tablets by mouth 3 (three) times daily as needed for indigestion or heartburn.    cholecalciferol (VITAMIN D) 400 UNITS TABS tablet Take 400 Units by mouth.    levETIRAcetam (KEPPRA) 500 MG tablet Take 1 tablet (500 mg total) by mouth 2 (two) times daily. Qty: 60 tablet, Refills: 1    pantoprazole (PROTONIX) 40 MG tablet Take 1 tablet (40 mg total) by mouth at bedtime. Qty: 30 tablet, Refills: 0      STOP taking these medications     dexamethasone (DECADRON) 4 MG tablet      HYDROcodone-acetaminophen (NORCO/VICODIN) 5-325 MG per tablet      HYDROcodone-homatropine (HYCODAN) 5-1.5 MG/5ML syrup         ALLERGIES:   Allergies  Allergen Reactions  . Penicillins Swelling    BRIEF HPI:  See H&P, Labs, Consult and Test reports for all details in brief, patient a 74 y.o. 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 Vertell Limber), who presented with dry cough, shortness of breath, runny nose and fever.Found to have Pul Embolism.  CONSULTATIONS:   None  PERTINENT RADIOLOGIC STUDIES: Ct Chest W Contrast  12/06/2014   CLINICAL DATA:  Subsequent treatment strategy for lung cancer diagnosed in December 2014. Pre metastasis.  EXAM: CT CHEST WITH CONTRAST  TECHNIQUE:  Multidetector CT imaging of the chest was performed during intravenous contrast administration.  CONTRAST:  35mL OMNIPAQUE IOHEXOL 300 MG/ML  SOLN  COMPARISON:  CT 07/31/2014, PET-CT 03/16/2013  FINDINGS: Mediastinum/Nodes: No axillary or supraclavicular lymphadenopathy. No mediastinal lymphadenopathy. There is left peribronchial thickening (image 23, series 2). The thickening posterior to lingula bronchus measures 9 mm compared to 11 mm on prior.  Lungs/Pleura: Perihilar masslike thickening surrounds the left upper lobe bronchus (same lesion as above) and measure approximately 22 x 20 mm on lung window series Image 22. This is also similar to prior where lesion measured 25 x 24 mm on image 23, series 5, 07/31/2014  There is volume loss in the left hemi thorax. There is perihilar consolidation and bronchiectasis in the left lower lobe which is slightly improved compared prior. Interval improvement in the right left pleural effusion compared to prior.  No pulmonary nodules on the right  Upper abdomen: Thickening of the adrenal glands is unchanged comparison exam. Adrenal thickening was not hypermetabolic comparison PET-CT scan.  Musculoskeletal: No aggressive osseous lesion.  IMPRESSION: 1. No evidence of lung cancer progression. 2. Left perihilar masslike soft tissue thickening surrounding the central upper lobe bronchi is not changed. 3. Interval improvement in the left lower lobe consolidation and pleural fluid.   Electronically Signed   By: Suzy Bouchard M.D.   On: 12/06/2014 08:30   Ct Angio Chest Pe W/cm &/or Wo Cm  12/24/2014   CLINICAL DATA:  Palpitations since yesterday. Shortness of breath. Left lung cancer about 20 months ago with radiation and chemotherapy.  EXAM: CT ANGIOGRAPHY CHEST WITH CONTRAST  TECHNIQUE: Multidetector CT imaging of the chest was performed using the standard protocol during bolus administration of intravenous contrast. Multiplanar CT image reconstructions and MIPs were obtained  to evaluate the vascular anatomy.  CONTRAST:  33mL OMNIPAQUE IOHEXOL 350 MG/ML SOLN  COMPARISON:  12/05/2014  FINDINGS: Technically adequate study with good opacification of the central and segmental pulmonary arteries. Filling defects are demonstrated in the baby right upper lobe anterior segmental pulmonary artery consistent with focal acute pulmonary embolus. This is new since the previous study. No large central pulmonary emboli. Ventricular measurements are limited but RV to LV ratio appears to be increased at 1.2. This may be artifactual due to motion artifact but right heart strain is not excluded.  Normal heart size. Normal caliber thoracic aorta. No aortic dissection. Great vessel origins are patent. Calcification in the coronary arteries and aorta. Esophagus is decompressed.  There is diffuse scarring, consolidation, and volume loss in the left lung with masslike infiltration in the left hilum. This corresponds to the patient's numb carcinoma and likely represents combination of mass and postobstructive change with radiation changes. Degree of consolidation is slightly more prominent than on the prior study. Diffuse emphysematous changes throughout the aerated portions of both lungs. No pleural effusions. No pneumothorax.  Included portions of the upper abdominal  organs are grossly unremarkable. Degenerative changes in the spine. Old left rib fractures. No destructive bone lesions.  Review of the MIP images confirms the above findings.  IMPRESSION: Positive examination for pulmonary embolus involving right upper lung segmental pulmonary artery. Heartbeat LV ratio is increased. This may be artifactual but right heart strain is not excluded. Left hilar mass with postobstructive change and radiation change. Mild progression of consolidation since prior study.  These results were called by telephone at the time of interpretation on 12/24/2014 at 9:24 pm to Dr. Sherwood Gambler , who verbally acknowledged these  results.   Electronically Signed   By: Lucienne Capers M.D.   On: 12/24/2014 21:26   Dg Chest Port 1 View  12/30/2014   CLINICAL DATA:  Shortness of breath and cough  EXAM: PORTABLE CHEST - 1 VIEW  COMPARISON:  12/24/2014  FINDINGS: Cardiac shadow is stable. Volume loss is again seen in the left base similar to that noted on the prior exam related to the patient's given clinical history. The right lung remains well aerated although increased interstitial changes are seen. No focal confluent infiltrate is noted.  IMPRESSION: Stable changes in the left base.  Increasing interstitial change in the right lung which may be related to a component of edema.   Electronically Signed   By: Inez Catalina M.D.   On: 12/30/2014 14:35   Dg Chest Port 1 View  12/24/2014   CLINICAL DATA:  sob onset today with a dry cough x 2 days and intermittent chest pain and palpitations x 1 week. States she also has had right foot swelling x 2 weeks. Hx of lung cancer, brain cancer, COPD, HTN and radiation therapy  EXAM: PORTABLE CHEST - 1 VIEW  COMPARISON:  CT 12/05/2014 and earlier studies  FINDINGS: Elevated left diaphragmatic leaflet. Consolidation/atelectasis in the left mid and lower lung. Right lung clear. Heart size difficult to assess due to adjacent opacity and leftward mediastinal shift. Chronic blunting of the left lateral costophrenic angle. No pneumothorax. Visualized skeletal structures are unremarkable.  IMPRESSION: 1. Left volume loss and parenchymal changes as before, without acute or superimposed abnormality.   Electronically Signed   By: Lucrezia Europe M.D.   On: 12/24/2014 18:49     PERTINENT LAB RESULTS: CBC:  Recent Labs  12/29/14 0500  WBC 4.8  HGB 9.9*  HCT 30.6*  PLT 201   CMET CMP     Component Value Date/Time   NA 136 12/28/2014 0602   NA 132* 12/05/2014 1524   K 3.3* 12/28/2014 0602   K 4.9 12/05/2014 1524   CL 99 12/28/2014 0602   CL 98 04/09/2013 1114   CO2 31 12/28/2014 0602   CO2 25  12/05/2014 1524   GLUCOSE 99 12/28/2014 0602   GLUCOSE 113 12/05/2014 1524   GLUCOSE 102* 04/09/2013 1114   BUN 11 12/28/2014 0602   BUN 27.6* 12/05/2014 1524   CREATININE 0.49* 12/28/2014 0602   CREATININE 0.7 12/05/2014 1524   CALCIUM 8.3* 12/28/2014 0602   CALCIUM 9.3 12/05/2014 1524   PROT 5.0* 12/28/2014 0602   PROT 6.1* 12/05/2014 1524   ALBUMIN 2.2* 12/28/2014 0602   ALBUMIN 3.4* 12/05/2014 1524   AST 23 12/28/2014 0602   AST 17 12/05/2014 1524   ALT 40* 12/28/2014 0602   ALT 60* 12/05/2014 1524   ALKPHOS 40 12/28/2014 0602   ALKPHOS 39* 12/05/2014 1524   BILITOT 1.0 12/28/2014 0602   BILITOT 0.85 12/05/2014 1524   GFRNONAA >90 12/28/2014  0602   GFRAA >90 12/28/2014 0602    GFR Estimated Creatinine Clearance: 45.8 mL/min (by C-G formula based on Cr of 0.49). No results for input(s): LIPASE, AMYLASE in the last 72 hours. No results for input(s): CKTOTAL, CKMB, CKMBINDEX, TROPONINI in the last 72 hours. Invalid input(s): POCBNP No results for input(s): DDIMER in the last 72 hours. No results for input(s): HGBA1C in the last 72 hours. No results for input(s): CHOL, HDL, LDLCALC, TRIG, CHOLHDL, LDLDIRECT in the last 72 hours. No results for input(s): TSH, T4TOTAL, T3FREE, THYROIDAB in the last 72 hours.  Invalid input(s): FREET3 No results for input(s): VITAMINB12, FOLATE, FERRITIN, TIBC, IRON, RETICCTPCT in the last 72 hours. Coags: No results for input(s): INR in the last 72 hours.  Invalid input(s): PT Microbiology: Recent Results (from the past 240 hour(s))  Blood culture (routine x 2)     Status: None (Preliminary result)   Collection Time: 12/24/14  9:15 PM  Result Value Ref Range Status   Specimen Description BLOOD RIGHT ANTECUBITAL  Final   Special Requests BOTTLES DRAWN AEROBIC AND ANAEROBIC 5ML  Final   Culture   Final           BLOOD CULTURE RECEIVED NO GROWTH TO DATE CULTURE WILL BE HELD FOR 5 DAYS BEFORE ISSUING A FINAL NEGATIVE REPORT Performed at  [facility]    Report Status PENDING  Incomplete  Blood culture (routine x 2)     Status: None (Preliminary result)   Collection Time: 12/24/14  9:40 PM  Result Value Ref Range Status   Specimen Description BLOOD RIGHT HAND  Final   Special Requests BOTTLES DRAWN AEROBIC AND ANAEROBIC 5CC   Final   Culture   Final           BLOOD CULTURE RECEIVED NO GROWTH TO DATE CULTURE WILL BE HELD FOR 5 DAYS BEFORE ISSUING A FINAL NEGATIVE REPORT Note: Culture results may be compromised due to an inadequate volume of blood received in culture bottles. Performed at [facility]    Report Status PENDING  Incomplete  Respiratory virus panel     Status: Abnormal   Collection Time: 12/25/14  2:05 AM  Result Value Ref Range Status   Source - RVPAN NOSE  Corrected   Respiratory Syncytial Virus A Negative Negative Final   Respiratory Syncytial Virus B Negative Negative Final   Influenza A Positive (A) Negative Final    Comment: Positive for Influenza A 2009 H1N1   Influenza B Positive (A) Negative Final   Parainfluenza 1 Negative Negative Final   Parainfluenza 2 Negative Negative Final   Parainfluenza 3 Negative Negative Final   Metapneumovirus Negative Negative Final   Rhinovirus Negative Negative Final   Adenovirus Negative Negative Final    Comment: (NOTE) Performed At: [facility] [address] [place], [place] [id number] [place] UR:[id number]      [place] COURSE:  Pulmonary embolism: Admitted and started on intravenous heparin infusion. Discussed with Dr. Vertell Limber, since patient two months out from craniotomy, okay to be anticoagulated long term. Discussed case with Dr. Julien Nordmann, he recommends Lovenox long-term given active cancer. Subsequently started on Lovenox, 2-D echocardiogram shows preserved EF.Will require atleast 6 months of anticoagulation. Please ensure patient follows with primary oncologist Dr Julien Nordmann.  Active Problems:  Sepsis:  likely secondary to post obstructive PNA and influenza. Blood cultures negative, Resp Virus panel positive for both influenza A and influenza B. Although symptoms have been going on for a number  of days, she has active malignancy, started on Tamiflu on 3/11 as she is high risk patient for complications. Initially on Vanco/Azactam/Flagyl-will now just on Levaquin-for 2 more days- as clinically improved.Requires 2 more doses of Tamiflu to complete a 5 days course. Afebrile and much improved at time of discharge.    Suspected postobstructive pneumonia: Antibiotics as above. Improved clinically    Acute Hypoxic respiratory failure: Secondary to pneumonia/influenza and pulmonary embolism. Continue with oxygen.   Anemia:Secondary to chronic disease and acute illness. CBC stable. Required on unit of PRBC on 3/10   History of non-small cell lung cancer with brain metastases-status post craniotomy in January 2016: Please ensure follow up with Dr. Julien Nordmann. Continue Keppra for seizure prophylaxis   Recurrent SVT: Monitored in telemetry. Continue low-dose metoprolol on discharge. Echocardiogram shows preserved ejection fraction. Consulted Cards, recommendations are to continue Metoprolol   COPD: Appears stable. Lungs basically clear. Continue with as needed bronchodilators   GERD: Continue PPI.   Mildly elevated liver function: Follow periodically as outpatient. -LFTs are markedly improved.   TODAY-DAY OF DISCHARGE:  Subjective:   Lynden Ang today has no headache,no chest abdominal pain,no new weakness tingling or numbness, feels much better  Objective:   Blood pressure 113/61, pulse 104, temperature 98 F (36.7 C), temperature source Oral, resp. rate 16, height $RemoveBe'5\' 1"'KpZMyAgOF$  (1.549 m), weight 55.7 kg (122 lb 12.7 oz), SpO2 98 %.  Intake/Output Summary (Last 24 hours) at 12/31/14 0810 Last data filed at 12/30/14 2317  Gross per 24 hour  Intake      0 ml  Output    200 ml  Net   -200 ml     Filed Weights   12/24/14 1804 12/25/14 0059  Weight: 56.7 kg (125 lb) 55.7 kg (122 lb 12.7 oz)    Exam Awake Alert, Oriented *3, No new F.N deficits, Normal affect [place].AT,PERRAL Supple Neck,No JVD, No cervical lymphadenopathy appriciated.  Symmetrical Chest wall movement, Good air movement bilaterally, CTAB RRR,No Gallops,Rubs or new Murmurs, No Parasternal Heave +ve B.Sounds, Abd Soft, Non tender, No organomegaly appriciated, No rebound -guarding or rigidity. No Cyanosis, Clubbing or edema, No new Rash or bruise  DISCHARGE CONDITION: Stable  DISPOSITION: SNF  DISCHARGE INSTRUCTIONS:    Activity:  As tolerated with Full fall precautions use walker/cane & assistance as needed  Diet recommendation: Heart Healthy diet  Discharge Instructions    Diet - low sodium heart healthy    Complete by:  As directed      Increase activity slowly    Complete by:  As directed           Follow-up Information    Follow up with [facility], MD On 01/07/2015.   Specialty:  Family Medicine   Why:  9:15 am   Contact information:   [place] [address] [phone]       Follow up with Eilleen Kempf., MD. Schedule an appointment as soon as possible for a visit in 2 weeks.   Specialty:  Oncology   Contact information:   [place] [address] [phone]        Total Time spent on discharge equals 45 minutes.  SignedOren Binet 12/31/2014 8:10 AM

## 2014-12-30 NOTE — Progress Notes (Signed)
Physical Therapy Treatment Patient Details Name: Chelsea Cruz MRN: [id number] DOB: 1941/06/02 Today's Date: 12/30/2014    History of Present Illness 74 yo female admitted with PE. Hx of craniotomy 10/2014 with residual R sided weakness, HTN, COPD, lung caner with brain mets. osteopenia.     PT Comments    Pt continues to require Mod assist for mobility. Unable to tolerate ambulation this session- O2 sats 87-88 % on 2L O2, HR as high as 140s during session. Assisted pt back to bed after stand pivot to [facility] for toileting. Performed supine A/AAROM exercises. Made RN aware of vitals.   Follow Up Recommendations  SNF;Supervision/Assistance - 24 hour     Equipment Recommendations  None recommended by PT    Recommendations for Other Services OT consult     Precautions / Restrictions Precautions Precautions: Fall Precaution Comments: droplet.  Restrictions Weight Bearing Restrictions: No    Mobility  Bed Mobility Overal bed mobility: Needs Assistance Bed Mobility: Supine to Sit;Sit to Supine     Supine to sit: Mod assist;HOB elevated Sit to sidelying: Min assist;HOB elevated   General bed mobility comments: Assist for LEs off bed and trunk to upright. Increased time.   Transfers Overall transfer level: Needs assistance Equipment used: Rolling walker (2 wheeled) Transfers: Sit to/from Stand Sit to Stand: Mod assist Stand pivot transfers: Min assist       General transfer comment: Assist to rise, stabilize, control descent. VCS safety, technique, hand placement. Momentum + rocking+counting is pt preference. Stand pivot x2, bed<>bsc with RW.   Ambulation/Gait             General Gait Details: NT-pt unable to tolerate this session.    Stairs            Wheelchair Mobility    Modified Rankin (Stroke Patients Only)       Balance                                    Cognition Arousal/Alertness: Awake/alert Behavior During Therapy: WFL for  tasks assessed/performed   Area of Impairment: Problem solving             Problem Solving: Requires tactile cues;Requires verbal cues;Difficulty sequencing      Exercises General Exercises - Lower Extremity Ankle Circles/Pumps: AROM;Both;15 reps;Supine Quad Sets: AROM;Both;10 reps;Supine Hip ABduction/ADduction: AAROM;Both;10 reps;Supine Straight Leg Raises: AAROM;Both;10 reps;Supine    General Comments        Pertinent Vitals/Pain Pain Assessment: No/denies pain    Home Living                      Prior Function            PT Goals (current goals can now be found in the care plan section) Progress towards PT goals: Progressing toward goals (slowly)    Frequency  Min 3X/week    PT Plan Current plan remains appropriate    Co-evaluation             End of Session Equipment Utilized During Treatment: Gait belt;Oxygen Activity Tolerance: Patient limited by fatigue Patient left: in bed;with call [place]/phone within reach;with bed alarm set     Time: 6712-4580 PT Time Calculation (min) (ACUTE ONLY): 22 min  Charges:  $Therapeutic Activity: 8-22 mins                    G Codes:  Weston Anna, MPT Pager: [phone]

## 2014-12-30 NOTE — Consult Note (Signed)
CARDIOLOGY CONSULT NOTE   Patient ID: Chelsea Cruz MRN: [id number], DOB/AGE: 76-08-1941   Admit date: 12/24/2014 Date of Consult: 12/30/2014   Primary Physician: Cari Caraway, MD Primary Cardiologist: None  Pt. Profile  74 year old woman with history of metastatic non-small cell lung cancer admitted with pulmonary embolus.  Telemetry shows frequent PACs.  Problem List  Past Medical History  Diagnosis Date  . Nodule of left lung   . Hypertension   . Nicotine addiction   . Osteopenia   . Osteomalacia   . Impaired fasting glucose   . Hypercholesteremia   . Depression   . COPD (chronic obstructive pulmonary disease)   . GERD (gastroesophageal reflux disease)   . Lung cancer dx'd 02/2013  . 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 Cholecystectomy    . Right oophorectomy      1969  . Cataract extraction w/ intraocular lens  implant, bilateral    . Bilateral carpal tunnel release    . Appendectomy    . Craniotomy N/A 11/08/2014    Procedure: CRANIOTOMY TUMOR EXCISION;  Surgeon: Erline Levine, MD;  Location: [facility];  Service: Neurosurgery;  Laterality: N/A;  CRANIOTOMY TUMOR EXCISION  . Brain surgery      tumor removal 11/08/2014     Allergies  Allergies  Allergen Reactions  . Penicillins Swelling    HPI   This 74 year old woman was admitted on 12/24/14 with dry cough chest pain and shortness of breath of one week's duration.  She had also had chills fever and temperature of 101 at home.  She was found to have an oxygen saturation of 87% in the emergency room.  CT angiogram of the chest showed pulmonary embolism.  She was treated initially with IV heparin.  She underwent echocardiogram on 12/26/14: - Left ventricle: The cavity size was normal. Wall thickness was increased in a pattern of mild LVH. Systolic function was normal. The estimated ejection fraction was in the  range of 60% to 65%. Wall motion was normal; there were no regional wall motion abnormalities. Features are consistent with a pseudonormal left ventricular filling pattern, with concomitant abnormal relaxation and increased filling pressure (grade 2 diastolic dysfunction). - Pulmonary arteries: Systolic pressure was moderately increased. PA peak pressure: 48 mm Hg (S). Venous Dopplers of her lower extremities did not reveal any evidence of DVT or Baker's cyst. She has improved since admission.  She is less dyspneic.  She still has occasional very minimal substernal discomfort on deep inspiration. Her blood pressures have been soft.  With activity she shows sinus tachycardia with frequent PACs.  She is currently on low-dose metoprolol 12.5 mg twice a day. For anticoagulation for her pulmonary embolism, oncology has recommended Lovenox because of her known underlying malignancy.    Inpatient Medications  . antiseptic oral rinse  7 mL Mouth Rinse BID  . cholecalciferol  400 Units Oral Daily  . enoxaparin (LOVENOX) injection  80 mg Subcutaneous Q24H  . levETIRAcetam  500 mg Oral BID  . levofloxacin  750 mg Oral Daily  . metoprolol tartrate  12.5 mg Oral BID  . oseltamivir  30 mg Oral BID  . pantoprazole  40 mg Oral QHS    Family History Family History  Problem Relation Age of Onset  . Heart disease Mother   . Coronary artery disease Brother   . Alzheimer's disease Brother   .  Heart disease Brother   . Hypertension Brother     #2  . Stroke Brother     #2  . Brain cancer Brother     #3     Social History History   Social History  . Marital Status: Widowed    Spouse Name: N/A  . Number of Children: N/A  . Years of Education: N/A   Occupational History  . retired    Social History Main Topics  . Smoking status: Former Smoker -- 1.00 packs/day for 45 years    Types: Cigarettes    Quit date: 03/06/2013  . Smokeless tobacco: Never Used  . Alcohol Use: No  .  Drug Use: No  . Sexual Activity: No   Other Topics Concern  . Not on file   Social History Narrative     Review of Systems  General:  Recent chills prior to admission Cardiovascular:  .  No edema, orthopnea, palpitations, paroxysmal nocturnal dyspnea.  Positive for dyspnea and pleuritic anterior chest pain. Dermatological: No rash, lesions/masses Respiratory: No cough, dyspnea Urologic: No hematuria, dysuria Abdominal:   No nausea, vomiting, diarrhea, bright red blood per rectum, melena, or hematemesis Neurologic:  No visual changes, wkns, changes in mental status. All other systems reviewed and are otherwise negative except as noted above.  Physical Exam  Blood pressure 111/47, pulse 93, temperature 98 F (36.7 C), temperature source Oral, resp. rate 16, height $RemoveBe'5\' 1"'medPQkclT$  (1.549 m), weight 122 lb 12.7 oz (55.7 kg), SpO2 96 %.  General: Pleasant, NAD Psych: Normal affect. Neuro: Alert and oriented X 3. Moves all extremities spontaneously. HEENT: Normal  Neck: Supple without bruits or JVD. Lungs:  Resp regular and unlabored, CTA. Heart: RRR no s3, s4, or murmurs.  Occasional premature beats noted. Abdomen: Soft, non-tender, non-distended, BS + x 4.  Extremities: No clubbing, cyanosis or edema. DP/PT/Radials 2+ and equal bilaterally.  Labs  No results for input(s): CKTOTAL, CKMB, TROPONINI in the last 72 hours. Lab Results  Component Value Date   WBC 4.8 12/29/2014   HGB 9.9* 12/29/2014   HCT 30.6* 12/29/2014   MCV 90.5 12/29/2014   PLT 201 12/29/2014     Recent Labs Lab 12/28/14 0602  NA 136  K 3.3*  CL 99  CO2 31  BUN 11  CREATININE 0.49*  CALCIUM 8.3*  PROT 5.0*  BILITOT 1.0  ALKPHOS 40  ALT 40*  AST 23  GLUCOSE 99   No results found for: CHOL, HDL, LDLCALC, TRIG No results found for: DDIMER  Radiology/Studies  Ct Chest W Contrast  12/06/2014   CLINICAL DATA:  Subsequent treatment strategy for lung cancer diagnosed in December 2014. Pre metastasis.   EXAM: CT CHEST WITH CONTRAST  TECHNIQUE: Multidetector CT imaging of the chest was performed during intravenous contrast administration.  CONTRAST:  68mL OMNIPAQUE IOHEXOL 300 MG/ML  SOLN  COMPARISON:  CT 07/31/2014, PET-CT 03/16/2013  FINDINGS: Mediastinum/Nodes: No axillary or supraclavicular lymphadenopathy. No mediastinal lymphadenopathy. There is left peribronchial thickening (image 23, series 2). The thickening posterior to lingula bronchus measures 9 mm compared to 11 mm on prior.  Lungs/Pleura: Perihilar masslike thickening surrounds the left upper lobe bronchus (same lesion as above) and measure approximately 22 x 20 mm on lung window series Image 22. This is also similar to prior where lesion measured 25 x 24 mm on image 23, series 5, 07/31/2014  There is volume loss in the left hemi thorax. There is perihilar consolidation and bronchiectasis in the left lower lobe  which is slightly improved compared prior. Interval improvement in the right left pleural effusion compared to prior.  No pulmonary nodules on the right  Upper abdomen: Thickening of the adrenal glands is unchanged comparison exam. Adrenal thickening was not hypermetabolic comparison PET-CT scan.  Musculoskeletal: No aggressive osseous lesion.  IMPRESSION: 1. No evidence of lung cancer progression. 2. Left perihilar masslike soft tissue thickening surrounding the central upper lobe bronchi is not changed. 3. Interval improvement in the left lower lobe consolidation and pleural fluid.   Electronically Signed   By: Genevive Bi M.D.   On: 12/06/2014 08:30   Ct Angio Chest Pe W/cm &/or Wo Cm  12/24/2014   CLINICAL DATA:  Palpitations since yesterday. Shortness of breath. Left lung cancer about 20 months ago with radiation and chemotherapy.  EXAM: CT ANGIOGRAPHY CHEST WITH CONTRAST  TECHNIQUE: Multidetector CT imaging of the chest was performed using the standard protocol during bolus administration of intravenous contrast. Multiplanar CT  image reconstructions and MIPs were obtained to evaluate the vascular anatomy.  CONTRAST:  55mL OMNIPAQUE IOHEXOL 350 MG/ML SOLN  COMPARISON:  12/05/2014  FINDINGS: Technically adequate study with good opacification of the central and segmental pulmonary arteries. Filling defects are demonstrated in the baby right upper lobe anterior segmental pulmonary artery consistent with focal acute pulmonary embolus. This is new since the previous study. No large central pulmonary emboli. Ventricular measurements are limited but RV to LV ratio appears to be increased at 1.2. This may be artifactual due to motion artifact but right heart strain is not excluded.  Normal heart size. Normal caliber thoracic aorta. No aortic dissection. Great vessel origins are patent. Calcification in the coronary arteries and aorta. Esophagus is decompressed.  There is diffuse scarring, consolidation, and volume loss in the left lung with masslike infiltration in the left hilum. This corresponds to the patient's numb carcinoma and likely represents combination of mass and postobstructive change with radiation changes. Degree of consolidation is slightly more prominent than on the prior study. Diffuse emphysematous changes throughout the aerated portions of both lungs. No pleural effusions. No pneumothorax.  Included portions of the upper abdominal organs are grossly unremarkable. Degenerative changes in the spine. Old left rib fractures. No destructive bone lesions.  Review of the MIP images confirms the above findings.  IMPRESSION: Positive examination for pulmonary embolus involving right upper lung segmental pulmonary artery. Heartbeat LV ratio is increased. This may be artifactual but right heart strain is not excluded. Left hilar mass with postobstructive change and radiation change. Mild progression of consolidation since prior study.  These results were called by telephone at the time of interpretation on 12/24/2014 at 9:24 pm to Dr. Pricilla Loveless , who verbally acknowledged these results.   Electronically Signed   By: Burman Nieves M.D.   On: 12/24/2014 21:26   Dg Chest Port 1 View  12/30/2014   CLINICAL DATA:  Shortness of breath and cough  EXAM: PORTABLE CHEST - 1 VIEW  COMPARISON:  12/24/2014  FINDINGS: Cardiac shadow is stable. Volume loss is again seen in the left base similar to that noted on the prior exam related to the patient's given clinical history. The right lung remains well aerated although increased interstitial changes are seen. No focal confluent infiltrate is noted.  IMPRESSION: Stable changes in the left base.  Increasing interstitial change in the right lung which may be related to a component of edema.   Electronically Signed   By: Eulah Pont.D.  On: 12/30/2014 14:35   Dg Chest Port 1 View  12/24/2014   CLINICAL DATA:  sob onset today with a dry cough x 2 days and intermittent chest pain and palpitations x 1 week. States she also has had right foot swelling x 2 weeks. Hx of lung cancer, brain cancer, COPD, HTN and radiation therapy  EXAM: PORTABLE CHEST - 1 VIEW  COMPARISON:  CT 12/05/2014 and earlier studies  FINDINGS: Elevated left diaphragmatic leaflet. Consolidation/atelectasis in the left mid and lower lung. Right lung clear. Heart size difficult to assess due to adjacent opacity and leftward mediastinal shift. Chronic blunting of the left lateral costophrenic angle. No pneumothorax. Visualized skeletal structures are unremarkable.  IMPRESSION: 1. Left volume loss and parenchymal changes as before, without acute or superimposed abnormality.   Electronically Signed   By: Lucrezia Europe M.D.   On: 12/24/2014 18:49    ECG  24-Dec-2014 19:48:13 [facility] ROUTINE RECORD Sinus tachycardia Possible Left atrial enlargement Left axis deviation Possible Anterior infarct , age undetermined Abnormal ECG rate is faster compared to Jan 2016 Confirmed by Regenia Skeeter MD, [place] [phone]) on 12/24/2014  11:23:36 PM Personally reviewed  ASSESSMENT AND PLAN  1.  Acute pulmonary embolus in the setting of known metastatic lung cancer. 2.  Frequent premature atrial beats.  This is not unexpected with pulmonary embolism and elevated right heart pressure.  Recommendation: Agree with plans for anticoagulation with Lovenox for her pulmonary embolus of unknown source.  Dopplers of legs were negative. Agree with use of low-dose beta blocker for control of exercise induced tachycardia.  Her tachycardia should improve over time as her pulmonary embolism is treated and resolves. Anticipate transfer to skilled nursing facility soon.   Signed, Darlin Coco, MD  12/30/2014, 4:02 PM

## 2014-12-31 DIAGNOSIS — J9601 Acute respiratory failure with hypoxia: Secondary | ICD-10-CM

## 2014-12-31 LAB — CULTURE, BLOOD (ROUTINE X 2)
Culture: NO GROWTH
Culture: NO GROWTH

## 2014-12-31 MED ORDER — LEVOFLOXACIN 750 MG PO TABS: 750.0000 mg | ORAL_TABLET | ORAL | Status: DC

## 2014-12-31 MED ORDER — OSELTAMIVIR PHOSPHATE 30 MG PO CAPS: 30.0000 mg | ORAL_CAPSULE | Freq: Two times a day (BID) | ORAL | Status: DC

## 2014-12-31 NOTE — Progress Notes (Signed)
Bladder scanned pt, pt had 221mls of urine in bladder. Pt was able to void 29mls. Will continue to monitor pt.

## 2014-12-31 NOTE — Progress Notes (Signed)
ANTICOAGULATION and ANTIBIOTIC CONSULT NOTE - Follow Up Consult  Pharmacy Consult for lovenox and levaquin Indication: new PE; PNA  Allergies  Allergen Reactions  . Penicillins Swelling    Patient Measurements: Height: 5\' 1"  (154.9 cm) Weight: 122 lb 12.7 oz (55.7 kg) IBW/kg (Calculated) : 47.8  Vital Signs: Temp: 98 F (36.7 C) (03/15 0542) Temp Source: Oral (03/15 0542) BP: 113/61 mmHg (03/15 0542) Pulse Rate: 104 (03/15 0542)  Labs:  Recent Labs  12/29/14 0500  HGB 9.9*  HCT 30.6*  PLT 201    Estimated Creatinine Clearance: 45.8 mL/min (by C-G formula based on Cr of 0.49).   Assessment: Patient is a 74 y.o F with NSCL cancer with brain mets s/p craniotomy in January on levaquin day #3 (total abx day #7) for PNA.  She remains afebrile this morning and all cultures have been negative thus far.  Last scr of 0.49 was obtained on 3/12 but she has been having trouble voiding since yesterday.  3/9 >> Vanc >> 3/11 3/9 >> Aztreo >> 3/13 3/11 >> Flagyl(MD) >> 3/13 3/11 >> Tamiflu >> 3/13 >> Levaquin po >>  Tmax: afeb WBCs: improved 4.8K Renal: SCr 0.49, Cl ~43 (using SCr 1) PCT < 0.1 Lactic acid: 1.5  3/8 blood x2: ngtd 3/9 resp virus panel: Infl A&B 3/9 legionella Ag: neg 3/9 Strep Ag: neg  She's also on lovenox for new PE.  CBC on 3/13 stable, no bleeding documented.  Goal of Therapy:  Anti-Xa level 0.6-1 units/ml 4hrs after LMWH dose given; eradication of infection Monitor platelets by anticoagulation protocol: Yes   Plan:  - change levaquin to 750mg  PO q48h - continue lovenox 80mg  SQ q24h (~1.5mg /kg/day) - f/u cultures, renal function  Pauline Trainer P 12/31/2014,7:47 AM

## 2014-12-31 NOTE — Progress Notes (Signed)
PATIENT DETAILS Name: Chelsea Cruz Age: 74 y.o. Sex: female Date of Birth: 07-20-1941 Admit Date: 12/24/2014 Admitting Physician Ivor Costa, MD ZOX:[id number], MD  Subjective: Feels better.   Assessment/Plan: Principal Problem:   Pulmonary embolism: Admitted and started on intravenous heparin infusion. Discussed with Dr. Vertell Limber, since patient two months out from craniotomy, okay to be anticoagulated long term. Discussed case with Dr. Julien Nordmann, he recommends Lovenox long-term given active cancer. Subsequently started on Lovenox,  2-D echocardiogram shows preserved EF.  Lovenox teaching started by RN.  Active Problems:    Sepsis: likely secondary to post obstructive PNA and influenza. Blood cultures negative,  Resp Virus panel positive for both influenza A and influenza B. Although symptoms have been going on for a number of days, she has active malignancy, started on Tamiflu as she is high risk patient for complications. Initially on Vanco/Azactam/Flagyl- now just on Levaquin as clinically improved.     Suspected postobstructive pneumonia: Antibiotics as above. Improved clinically    Anemia:Secondary to chronic disease and acute illness. CBC stable. Required on unit of [facility] on 3/10    Acute respiratory failure: Secondary to pneumonia/influenza and pulmonary embolism. Continue with oxygen.     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 Julien Nordmann, see above. Continue Keppra for seizure prophylaxis    Recurrent SVT/Aflutter: Monitor in telemetry. Continues to have tachycardia mostly on exertion-BP soft-unable to go up on the dose of metoprolol. Seen by cardiology, recommendations are to continue beta blocker discharge.     COPD: Appears stable. Lungs basically clear. Continue with as needed bronchodilators     GERD: Continue PPI.     Mildly elevated liver function: Follow periodically-LFTs are markedly  improved.    Disposition: Remain inpatient-SNF today  Antibiotics:  See below   Anti-infectives    Start     Dose/Rate Route Frequency Ordered Stop   01/01/15 0800  levofloxacin (LEVAQUIN) tablet 750 mg     750 mg Oral Every 48 hours 12/31/14 0757     12/31/14 0000  oseltamivir (TAMIFLU) 30 MG capsule     30 mg Oral 2 times daily 12/31/14 0810     12/30/14 0000  levofloxacin (LEVAQUIN) 750 MG tablet     750 mg Oral Daily 12/30/14 0810     12/30/14 0000  oseltamivir (TAMIFLU) 30 MG capsule  Status:  Discontinued     30 mg Oral 2 times daily 12/30/14 0810 12/31/14    12/29/14 1000  levofloxacin (LEVAQUIN) tablet 750 mg  Status:  Discontinued     750 mg Oral Daily 12/29/14 0907 12/31/14 0757   12/27/14 2200  oseltamivir (TAMIFLU) capsule 30 mg     30 mg Oral 2 times daily 12/27/14 1903 01/01/15 2159   12/27/14 0900  metroNIDAZOLE (FLAGYL) IVPB 500 mg  Status:  Discontinued     500 mg 100 mL/hr over 60 Minutes Intravenous Every 8 hours 12/27/14 0829 12/29/14 0840   12/25/14 1400  aztreonam (AZACTAM) 1 g in dextrose 5 % 50 mL IVPB  Status:  Discontinued     1 g 100 mL/hr over 30 Minutes Intravenous 3 times per day 12/25/14 0238 12/29/14 0840   12/25/14 1200  vancomycin (VANCOCIN) 500 mg in sodium chloride 0.9 % 100 mL IVPB  Status:  Discontinued     500 mg 100 mL/hr over 60 Minutes Intravenous Every 12 hours 12/25/14 0238 12/27/14 0829   12/25/14  0600  aztreonam (AZACTAM) injection 1 g  Status:  Discontinued     1 g Intramuscular 3 times per day 12/25/14 0155 12/25/14 0226   12/25/14 0200  vancomycin (VANCOCIN) IVPB 750 mg/150 ml premix  Status:  Discontinued     750 mg 150 mL/hr over 60 Minutes Intravenous Every 8 hours 12/25/14 0155 12/25/14 0212   12/24/14 2315  aztreonam (AZACTAM) 2 g in dextrose 5 % 50 mL IVPB     2 g 100 mL/hr over 30 Minutes Intravenous  Once 12/24/14 2308 12/25/14 0330   12/24/14 2315  vancomycin (VANCOCIN) IVPB 1000 mg/200 mL premix     1,000 mg 200  mL/hr over 60 Minutes Intravenous  Once 12/24/14 2308 12/25/14 0050      DVT Prophylaxis: On therapeutic Lovenox  Code Status: Full code   Family Communication Daughter at bedside  Procedures:  None  CONSULTS:  None  MEDICATIONS: Scheduled Meds: . antiseptic oral rinse  7 mL Mouth Rinse BID  . cholecalciferol  400 Units Oral Daily  . enoxaparin (LOVENOX) injection  80 mg Subcutaneous Q24H  . levETIRAcetam  500 mg Oral BID  . [START ON 01/01/2015] levofloxacin  750 mg Oral Q48H  . metoprolol tartrate  12.5 mg Oral BID  . oseltamivir  30 mg Oral BID  . pantoprazole  40 mg Oral QHS   Continuous Infusions:   PRN Meds:.acetaminophen, albuterol, calcium carbonate, chlorpheniramine-HYDROcodone, morphine injection, oxyCODONE    PHYSICAL EXAM: Vital signs in last 24 hours: Filed Vitals:   12/30/14 1131 12/30/14 1452 12/30/14 2109 12/31/14 0542  BP:  111/47 112/48 113/61  Pulse:  93 107 104  Temp:  98 F (36.7 C)  98 F (36.7 C)  TempSrc:  Oral  Oral  Resp:  16 20 16   Height:      Weight:      SpO2: 95% 96% 93% 98%    Weight change:  Filed Weights   12/24/14 1804 12/25/14 0059  Weight: 56.7 kg (125 lb) 55.7 kg (122 lb 12.7 oz)   Body mass index is 23.21 kg/(m^2).   Gen Exam: Awake and alert with clear speech.  Not in any distress Neck: Supple, No JVD.   Chest: B/L Clear.  No rales or rhonchi CVS: S1 S2 Regular, no murmurs.  Abdomen: soft, BS +, non tender, non distended.  Extremities: no edema, lower extremities warm to touch. Neurologic: Non Focal.   Skin: No Rash.   Wounds: N/A.    Intake/Output from previous day:  Intake/Output Summary (Last 24 hours) at 12/31/14 0842 Last data filed at 12/30/14 2317  Gross per 24 hour  Intake      0 ml  Output    200 ml  Net   -200 ml     LAB RESULTS: CBC  Recent Labs Lab 12/24/14 1915  12/26/14 0507 12/26/14 1830 12/27/14 0555 12/28/14 0602 12/29/14 0500  WBC 4.6  < > 2.9* 4.6 3.9* 4.0 4.8  HGB  11.2*  < > 7.0* 11.7* 11.4* 9.7* 9.9*  HCT 35.2*  < > 22.3* 36.0 35.3* 29.7* 30.6*  PLT 175  < > 153 170 169 179 201  MCV 89.1  < > 89.2 90.7 90.1 89.7 90.5  MCH 28.4  < > 28.0 29.5 29.1 29.3 29.3  MCHC 31.8  < > 31.4 32.5 32.3 32.7 32.4  RDW 19.9*  < > 18.9* 17.2* 17.6* 17.7* 18.0*  LYMPHSABS 1.9  --   --   --   --   --   --  MONOABS 0.3  --   --   --   --   --   --   EOSABS 0.0  --   --   --   --   --   --   BASOSABS 0.0  --   --   --   --   --   --   < > = values in this interval not displayed.  Chemistries   Recent Labs Lab 12/24/14 1915 12/26/14 0500 12/26/14 0507 12/27/14 0555 12/28/14 0602  NA 137  --  139 139 136  K 3.9  --  3.1* 4.1 3.3*  CL 106  --  106 102 99  CO2 28  --  29 27 31   GLUCOSE 126*  --  91 99 99  BUN 22  --  9 12 11   CREATININE 0.53  --  0.44* 0.50 0.49*  CALCIUM 8.7  --  8.1* 8.7 8.3*  MG  --  1.6  --   --   --     CBG: No results for input(s): GLUCAP in the last 168 hours.  GFR Estimated Creatinine Clearance: 45.8 mL/min (by C-G formula based on Cr of 0.49).  Coagulation profile  Recent Labs Lab 12/25/14 0236  INR 1.27    Cardiac Enzymes  Recent Labs Lab 12/24/14 1915  TROPONINI 0.03    Invalid input(s): POCBNP No results for input(s): DDIMER in the last 72 hours. No results for input(s): HGBA1C in the last 72 hours. No results for input(s): CHOL, HDL, LDLCALC, TRIG, CHOLHDL, LDLDIRECT in the last 72 hours. No results for input(s): TSH, T4TOTAL, T3FREE, THYROIDAB in the last 72 hours.  Invalid input(s): FREET3 No results for input(s): VITAMINB12, FOLATE, FERRITIN, TIBC, IRON, RETICCTPCT in the last 72 hours. No results for input(s): LIPASE, AMYLASE in the last 72 hours.  Urine Studies No results for input(s): UHGB, CRYS in the last 72 hours.  Invalid input(s): UACOL, UAPR, USPG, UPH, UTP, UGL, UKET, UBIL, UNIT, UROB, ULEU, UEPI, UWBC, URBC, UBAC, CAST, UCOM, BILUA  MICROBIOLOGY: Recent Results (from the past 240  hour(s))  Blood culture (routine x 2)     Status: None   Collection Time: 12/24/14  9:15 PM  Result Value Ref Range Status   Specimen Description BLOOD RIGHT ANTECUBITAL  Final   Special Requests BOTTLES DRAWN AEROBIC AND ANAEROBIC 5ML  Final   Culture   Final    NO GROWTH 5 DAYS Performed at [facility]    Report Status 12/31/2014 FINAL  Final  Blood culture (routine x 2)     Status: None   Collection Time: 12/24/14  9:40 PM  Result Value Ref Range Status   Specimen Description BLOOD RIGHT HAND  Final   Special Requests BOTTLES DRAWN AEROBIC AND ANAEROBIC 5CC   Final   Culture   Final    NO GROWTH 5 DAYS Note: Culture results may be compromised due to an inadequate volume of blood received in culture bottles. Performed at [facility]    Report Status 12/31/2014 FINAL  Final  Respiratory virus panel     Status: Abnormal   Collection Time: 12/25/14  2:05 AM  Result Value Ref Range Status   Source - RVPAN NOSE  Corrected   Respiratory Syncytial Virus A Negative Negative Final   Respiratory Syncytial Virus B Negative Negative Final   Influenza A Positive (A) Negative Final    Comment: Positive for Influenza A 2009 H1N1   Influenza B Positive (A)  Negative Final   Parainfluenza 1 Negative Negative Final   Parainfluenza 2 Negative Negative Final   Parainfluenza 3 Negative Negative Final   Metapneumovirus Negative Negative Final   Rhinovirus Negative Negative Final   Adenovirus Negative Negative Final    Comment: (NOTE) Performed At: [facility] [address] [place], [place] [id number] Lindon Romp MD [id number]     EGBTDVVOH STUDIES/RESULTS: Ct Chest W Contrast  12/06/2014   CLINICAL DATA:  Subsequent treatment strategy for lung cancer diagnosed in December 2014. Pre metastasis.  EXAM: CT CHEST WITH CONTRAST  TECHNIQUE: Multidetector CT imaging of the chest was performed during intravenous contrast administration.  CONTRAST:  19mL OMNIPAQUE  IOHEXOL 300 MG/ML  SOLN  COMPARISON:  CT 07/31/2014, PET-CT 03/16/2013  FINDINGS: Mediastinum/Nodes: No axillary or supraclavicular lymphadenopathy. No mediastinal lymphadenopathy. There is left peribronchial thickening (image 23, series 2). The thickening posterior to lingula bronchus measures 9 mm compared to 11 mm on prior.  Lungs/Pleura: Perihilar masslike thickening surrounds the left upper lobe bronchus (same lesion as above) and measure approximately 22 x 20 mm on lung window series Image 22. This is also similar to prior where lesion measured 25 x 24 mm on image 23, series 5, 07/31/2014  There is volume loss in the left hemi thorax. There is perihilar consolidation and bronchiectasis in the left lower lobe which is slightly improved compared prior. Interval improvement in the right left pleural effusion compared to prior.  No pulmonary nodules on the right  Upper abdomen: Thickening of the adrenal glands is unchanged comparison exam. Adrenal thickening was not hypermetabolic comparison PET-CT scan.  Musculoskeletal: No aggressive osseous lesion.  IMPRESSION: 1. No evidence of lung cancer progression. 2. Left perihilar masslike soft tissue thickening surrounding the central upper lobe bronchi is not changed. 3. Interval improvement in the left lower lobe consolidation and pleural fluid.   Electronically Signed   By: Suzy Bouchard M.D.   On: 12/06/2014 08:30   Ct Angio Chest Pe W/cm &/or Wo Cm  12/24/2014   CLINICAL DATA:  Palpitations since yesterday. Shortness of breath. Left lung cancer about 20 months ago with radiation and chemotherapy.  EXAM: CT ANGIOGRAPHY CHEST WITH CONTRAST  TECHNIQUE: Multidetector CT imaging of the chest was performed using the standard protocol during bolus administration of intravenous contrast. Multiplanar CT image reconstructions and MIPs were obtained to evaluate the vascular anatomy.  CONTRAST:  70mL OMNIPAQUE IOHEXOL 350 MG/ML SOLN  COMPARISON:  12/05/2014  FINDINGS:  Technically adequate study with good opacification of the central and segmental pulmonary arteries. Filling defects are demonstrated in the baby right upper lobe anterior segmental pulmonary artery consistent with focal acute pulmonary embolus. This is new since the previous study. No large central pulmonary emboli. Ventricular measurements are limited but RV to LV ratio appears to be increased at 1.2. This may be artifactual due to motion artifact but right heart strain is not excluded.  Normal heart size. Normal caliber thoracic aorta. No aortic dissection. Great vessel origins are patent. Calcification in the coronary arteries and aorta. Esophagus is decompressed.  There is diffuse scarring, consolidation, and volume loss in the left lung with masslike infiltration in the left hilum. This corresponds to the patient's numb carcinoma and likely represents combination of mass and postobstructive change with radiation changes. Degree of consolidation is slightly more prominent than on the prior study. Diffuse emphysematous changes throughout the aerated portions of both lungs. No pleural effusions. No pneumothorax.  Included portions of the upper abdominal organs  are grossly unremarkable. Degenerative changes in the spine. Old left rib fractures. No destructive bone lesions.  Review of the MIP images confirms the above findings.  IMPRESSION: Positive examination for pulmonary embolus involving right upper lung segmental pulmonary artery. Heartbeat LV ratio is increased. This may be artifactual but right heart strain is not excluded. Left hilar mass with postobstructive change and radiation change. Mild progression of consolidation since prior study.  These results were called by telephone at the time of interpretation on 12/24/2014 at 9:24 pm to Dr. Sherwood Gambler , who verbally acknowledged these results.   Electronically Signed   By: Lucienne Capers M.D.   On: 12/24/2014 21:26   Dg Chest Port 1 View  12/30/2014    CLINICAL DATA:  Shortness of breath and cough  EXAM: PORTABLE CHEST - 1 VIEW  COMPARISON:  12/24/2014  FINDINGS: Cardiac shadow is stable. Volume loss is again seen in the left base similar to that noted on the prior exam related to the patient's given clinical history. The right lung remains well aerated although increased interstitial changes are seen. No focal confluent infiltrate is noted.  IMPRESSION: Stable changes in the left base.  Increasing interstitial change in the right lung which may be related to a component of edema.   Electronically Signed   By: Inez Catalina M.D.   On: 12/30/2014 14:35   Dg Chest Port 1 View  12/24/2014   CLINICAL DATA:  sob onset today with a dry cough x 2 days and intermittent chest pain and palpitations x 1 week. States she also has had right foot swelling x 2 weeks. Hx of lung cancer, brain cancer, COPD, HTN and radiation therapy  EXAM: PORTABLE CHEST - 1 VIEW  COMPARISON:  CT 12/05/2014 and earlier studies  FINDINGS: Elevated left diaphragmatic leaflet. Consolidation/atelectasis in the left mid and lower lung. Right lung clear. Heart size difficult to assess due to adjacent opacity and leftward mediastinal shift. Chronic blunting of the left lateral costophrenic angle. No pneumothorax. Visualized skeletal structures are unremarkable.  IMPRESSION: 1. Left volume loss and parenchymal changes as before, without acute or superimposed abnormality.   Electronically Signed   By: Lucrezia Europe M.D.   On: 12/24/2014 18:49    Oren Binet, MD  [facility] Pager:[phone]  If 7PM-7AM, please contact night-coverage [email or web address] Password [email or web address] 12/31/2014, 8:42 AM   LOS: 7 days

## 2014-12-31 NOTE — Progress Notes (Signed)
Got pt up to [facility], pt voided a small amount of urine. Bladder scanned pt, there was less than 234mls in bladder. Pt c/o of no pain or pressure in suprapubic area. Will try to void again in a couple hrs. Daughter said pt has not had much fluid intake over the past day or so. Will continue to monitor.

## 2014-12-31 NOTE — Progress Notes (Signed)
Pt transferred to [facility] at this time via [place] [facility]. Pt and dtr in agreement. Pt transferred in stable condition on 4lnc. Pt dtr in possession of pt's belongings and transport crew in possession of transfer packet.

## 2015-01-02 DIAGNOSIS — Z7189 Other specified counseling: Secondary | ICD-10-CM | POA: Insufficient documentation

## 2015-01-03 ENCOUNTER — Telehealth: Payer: Self-pay | Admitting: *Deleted

## 2015-01-03 NOTE — Telephone Encounter (Signed)
I advised daughter to communicate her concerns to her nurse at [facility], Note to [place].

## 2015-01-03 NOTE — Telephone Encounter (Signed)
DAUGHTER HAS SOME CONCERNS AND WOULD LIKE DR.MOHAMED'S INPUT. PT. IS STILL HAVING PAIN WHEN SHE IS UPRIGHT IN HER CHEST AND SIDES. PT. IS NOT EATING MUCH. HER ABDOMEN IS DISTENDED. PT. HAS HAD TWO BOWEL MOVEMENTS SINCE SHE HAS BEEN IN REHAB. DAUGHTER THINKS PT. LOOKS JAUNDICE. THIS NOTE ROUTED TO DR.MOHAMED AND DIANE Nomura,RN.

## 2015-01-06 NOTE — Telephone Encounter (Signed)
Pt has "improved somewhat , oxygen at 93 and 94%, ". Pain when she sits up so she does not sit upright.

## 2015-01-14 ENCOUNTER — Other Ambulatory Visit: Payer: Self-pay | Admitting: Radiation Therapy

## 2015-01-14 DIAGNOSIS — C7931 Secondary malignant neoplasm of brain: Secondary | ICD-10-CM

## 2015-01-15 ENCOUNTER — Telehealth: Payer: Self-pay | Admitting: *Deleted

## 2015-01-15 ENCOUNTER — Other Ambulatory Visit: Payer: Self-pay | Admitting: Radiation Therapy

## 2015-01-15 ENCOUNTER — Telehealth: Payer: Self-pay | Admitting: Internal Medicine

## 2015-01-15 DIAGNOSIS — C7931 Secondary malignant neoplasm of brain: Secondary | ICD-10-CM

## 2015-01-15 NOTE — Telephone Encounter (Signed)
Per Manuela Schwartz in radiation I have scheduled appts. She will call the patient

## 2015-01-15 NOTE — Telephone Encounter (Signed)
returned pt transportation call from [phone] and lvm confirm appt

## 2015-01-16 ENCOUNTER — Telehealth: Payer: Self-pay | Admitting: Internal Medicine

## 2015-01-16 NOTE — Telephone Encounter (Signed)
returned call to [facility] at [phone] and lvm for them to call back to r/s appt

## 2015-02-13 ENCOUNTER — Other Ambulatory Visit: Payer: Self-pay | Admitting: Medical Oncology

## 2015-02-13 DIAGNOSIS — C3432 Malignant neoplasm of lower lobe, left bronchus or lung: Secondary | ICD-10-CM

## 2015-02-14 ENCOUNTER — Ambulatory Visit: Payer: Medicare Other

## 2015-02-14 ENCOUNTER — Other Ambulatory Visit (HOSPITAL_BASED_OUTPATIENT_CLINIC_OR_DEPARTMENT_OTHER): Payer: Medicare Other

## 2015-02-14 ENCOUNTER — Ambulatory Visit
Admission: RE | Admit: 2015-02-14 | Discharge: 2015-02-14 | Disposition: A | Discharge status: Home / Self Care | Payer: Medicare Other | Source: Ambulatory Visit | Attending: Radiation Oncology | Admitting: Radiation Oncology

## 2015-02-14 DIAGNOSIS — C3492 Malignant neoplasm of unspecified part of left bronchus or lung: Secondary | ICD-10-CM

## 2015-02-14 DIAGNOSIS — C3432 Malignant neoplasm of lower lobe, left bronchus or lung: Secondary | ICD-10-CM

## 2015-02-14 DIAGNOSIS — C7931 Secondary malignant neoplasm of brain: Secondary | ICD-10-CM | POA: Diagnosis not present

## 2015-02-14 LAB — CBC WITH DIFFERENTIAL/PLATELET
BASO%: 0.5 % (ref 0.0–2.0)
BASOS ABS: 0 10*3/uL (ref 0.0–0.1)
EOS ABS: 0.4 10*3/uL (ref 0.0–0.5)
EOS%: 7.2 % — AB (ref 0.0–7.0)
HEMATOCRIT: 33.6 % — AB (ref 34.8–46.6)
HGB: 10.4 g/dL — ABNORMAL LOW (ref 11.6–15.9)
LYMPH%: 26 % (ref 14.0–49.7)
MCH: 30.1 pg (ref 25.1–34.0)
MCHC: 31 g/dL — AB (ref 31.5–36.0)
MCV: 97.4 fL (ref 79.5–101.0)
MONO#: 0.4 10*3/uL (ref 0.1–0.9)
MONO%: 7.2 % (ref 0.0–14.0)
NEUT#: 3.6 10*3/uL (ref 1.5–6.5)
NEUT%: 59.1 % (ref 38.4–76.8)
Platelets: 153 10*3/uL (ref 145–400)
RBC: 3.45 10*6/uL — AB (ref 3.70–5.45)
RDW: 15 % — AB (ref 11.2–14.5)
WBC: 6.2 10*3/uL (ref 3.9–10.3)
lymph#: 1.6 10*3/uL (ref 0.9–3.3)

## 2015-02-14 LAB — COMPREHENSIVE METABOLIC PANEL (CC13)
ALBUMIN: 3.2 g/dL — AB (ref 3.5–5.0)
ALT: 69 U/L — ABNORMAL HIGH (ref 0–55)
AST: 51 U/L — AB (ref 5–34)
Alkaline Phosphatase: 65 U/L (ref 40–150)
Anion Gap: 10 mEq/L (ref 3–11)
BILIRUBIN TOTAL: 0.59 mg/dL (ref 0.20–1.20)
BUN: 13.9 mg/dL (ref 7.0–26.0)
CHLORIDE: 102 meq/L (ref 98–109)
CO2: 29 mEq/L (ref 22–29)
Calcium: 9.4 mg/dL (ref 8.4–10.4)
Creatinine: 0.6 mg/dL (ref 0.6–1.1)
EGFR: 89 mL/min/{1.73_m2} — AB (ref >90)
Glucose: 128 mg/dl (ref 70–140)
Potassium: 4 mEq/L (ref 3.5–5.1)
Sodium: 142 mEq/L (ref 136–145)
TOTAL PROTEIN: 6.1 g/dL — AB (ref 6.4–8.3)

## 2015-02-14 MED ORDER — GADOBENATE DIMEGLUMINE 529 MG/ML IV SOLN
11.0000 mL | Freq: Once | INTRAVENOUS | Status: AC | PRN
  Administered 2015-02-14: 11 mL via INTRAVENOUS

## 2015-02-16 ENCOUNTER — Encounter: Payer: Self-pay | Admitting: Radiation Therapy

## 2015-02-17 ENCOUNTER — Encounter: Payer: Self-pay | Admitting: Radiation Oncology

## 2015-02-17 ENCOUNTER — Telehealth: Payer: Self-pay | Admitting: Radiation Oncology

## 2015-02-17 ENCOUNTER — Ambulatory Visit
Admission: RE | Admit: 2015-02-17 | Discharge: 2015-02-17 | Disposition: A | Discharge status: Home / Self Care | Payer: Medicare Other | Source: Ambulatory Visit | Attending: Radiation Oncology | Admitting: Radiation Oncology

## 2015-02-17 ENCOUNTER — Other Ambulatory Visit: Payer: Self-pay | Admitting: Radiation Therapy

## 2015-02-17 VITALS — BP 117/44 | HR 72 | Resp 16 | Wt 128.1 lb

## 2015-02-17 DIAGNOSIS — C7931 Secondary malignant neoplasm of brain: Secondary | ICD-10-CM

## 2015-02-17 MED ORDER — LEVETIRACETAM 500 MG PO TABS: 500.0000 mg | ORAL_TABLET | Freq: Every day | ORAL | Status: DC

## 2015-02-17 MED ORDER — LEVETIRACETAM 500 MG PO TABS: 500.0000 mg | ORAL_TABLET | Freq: Two times a day (BID) | ORAL | Status: DC

## 2015-02-17 NOTE — Telephone Encounter (Signed)
Called in keppra script to [facility]. Pharmacy doesn't open until 1000. Left message with script information.

## 2015-02-17 NOTE — Progress Notes (Signed)
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    ICD-9-CM ICD-10-CM   1. Solitary Left Frontal 3.4 cm Brain Metastasis 198.3 C79.31 levETIRAcetam (KEPPRA) 500 MG tablet     DISCONTINUED: levETIRAcetam (KEPPRA) 500 MG tablet    Interval Since Last Radiation:  3 Months  Narrative:  The patient returns today for routine follow-up.  Weight and vitals stable. Denies pain. Reports nurse comes to the home once per week. Also, reports that a physical, speech and occupational therapist come to the home. Patient able to ambulate up steps. Denies taking decadron. Reports taking keppra 500 mg bid. Requesting refill of Keppra. Denies headaches, dizziness, nausea, vomiting, diplopia or ringing in the ears. Oxygen therapy 2 liters via nasal cannula.  Reports cough has resolved.                          ALLERGIES:  is allergic to penicillins.  Meds: Current Outpatient Prescriptions  Medication Sig Dispense Refill  . cholecalciferol (VITAMIN D) 400 UNITS TABS tablet Take 400 Units by mouth.    . enoxaparin (LOVENOX) 80 MG/0.8ML injection Inject 0.8 mLs (80 mg total) into the skin daily. 22.4 Syringe   . levETIRAcetam (KEPPRA) 500 MG tablet Take 1 tablet (500 mg total) by mouth daily. Take one pill for 7 days then stop 7 tablet 0  . metoprolol tartrate (LOPRESSOR) 25 MG tablet Take 1 tablet (25 mg total) by mouth 2 (two) times daily.    Marland Kitchen acetaminophen (TYLENOL) 325 MG tablet Take 650 mg by mouth every 6 (six) hours as needed (for pain.).    Marland Kitchen albuterol (PROVENTIL) (2.5 MG/3ML) 0.083% nebulizer solution Take 3 mLs (2.5 mg total) by nebulization every 2 (two) hours  as needed for wheezing or shortness of breath. (Patient not taking: Reported on 02/17/2015) 75 mL 12  . calcium carbonate (TUMS - DOSED IN MG ELEMENTAL CALCIUM) 500 MG chewable tablet Chew 2 tablets by mouth 3 (three) times daily as needed for indigestion or heartburn.    . chlorpheniramine-HYDROcodone (TUSSIONEX) 10-8 MG/5ML LQCR Take 5 mLs by mouth every 12 (twelve) hours as needed for cough. (Patient not taking: Reported on 02/17/2015) 115 mL 0  . dexamethasone (DECADRON) 4 MG tablet   0  . guaiFENesin (MUCINEX) 600 MG 12 hr tablet Take 1 tablet (600 mg total) by mouth 2 (two) times daily. (Patient not taking: Reported on 02/17/2015)    . HYDROcodone-acetaminophen (NORCO/VICODIN) 5-325 MG per tablet   0  . oseltamivir (TAMIFLU) 30 MG capsule Take 1 capsule (30 mg total) by mouth 2 (two) times daily. For 2 MORE DOSES AND THEN DISCONTINUE (Patient not taking: Reported on 02/17/2015)    . pantoprazole (PROTONIX) 40 MG tablet Take 1 tablet (40 mg total) by mouth at bedtime. (Patient not taking: Reported on 02/17/2015) 30 tablet 0   No current facility-administered medications for this encounter.    Physical Findings: The patient is in no acute distress. Patient is alert and oriented.  weight is 128 lb 1.6 oz (58.106 kg). Her blood pressure is 117/44 and her pulse is 72. Her respiration  is 16. . No significant changes.   Lab Findings: Lab Results  Component Value Date   WBC 6.2 02/14/2015   WBC 4.8 12/29/2014   HGB 10.4* 02/14/2015   HGB 9.9* 12/29/2014   HCT 33.6* 02/14/2015   HCT 30.6* 12/29/2014   PLT 153 02/14/2015   PLT 201 12/29/2014    Lab Results  Component Value Date   NA 142 02/14/2015   NA 136 12/28/2014   K 4.0 02/14/2015   K 3.3* 12/28/2014   CHLORIDE 102 02/14/2015   CO2 29 02/14/2015   CO2 31 12/28/2014   GLUCOSE 128 02/14/2015   GLUCOSE 99 12/28/2014   GLUCOSE 102* 04/09/2013   BUN 13.9 02/14/2015   BUN 11 12/28/2014   CREATININE 0.6 02/14/2015   CREATININE 0.49*  12/28/2014   BILITOT 0.59 02/14/2015   BILITOT 1.0 12/28/2014   ALKPHOS 65 02/14/2015   ALKPHOS 40 12/28/2014   AST 51* 02/14/2015   AST 23 12/28/2014   ALT 69* 02/14/2015   ALT 40* 12/28/2014   PROT 6.1* 02/14/2015   PROT 5.0* 12/28/2014   ALBUMIN 3.2* 02/14/2015   ALBUMIN 2.2* 12/28/2014   CALCIUM 9.4 02/14/2015   CALCIUM 8.3* 12/28/2014   ANIONGAP 10 02/14/2015   ANIONGAP 6 12/28/2014    Radiographic Findings: Mr [name] Wo Contrast  02/14/2015   CLINICAL DATA:  S RS restaging. Metastatic lung cancer treated with excision and radiation.  EXAM: MRI HEAD WITHOUT AND WITH CONTRAST  TECHNIQUE: Multiplanar, multiecho pulse sequences of the brain and surrounding structures were obtained without and with intravenous contrast.  CONTRAST:  11 cc MultiHance  COMPARISON:  11/09/2014.  10/28/2014.  03/16/2013.  FINDINGS: Since the previous study, there is much less swelling, edema and mass effect in the left hemisphere related to the previous left frontal metastatic tumor resection and perioperative infarction. The left ventricle is now larger than the right because of resolution of the swelling and some volume loss related to the infarction and the resection. There is considerable contrast enhancement throughout that region, as would be expected in a region of infarction 3 months later. The small foci of contrast enhancement seen along the medial margin of the resection on the previous study are completely lost in the enhancement due to the subacute infarction. There is certainly no component of this that I can't specifically identify as representing tumor. There is no mass effect. Residual blood products are seen throughout the region. There is no new or second lesion. Elsewhere, the brain shows some old small vessel changes of the white matter. No obstructive hydrocephalus. No extra-axial fluid collection. No pituitary mass. No inflammatory sinus disease. There is fluid within the mastoid air cells  bilaterally. No skull or skullbase lesion. Insignificant venous angioma in the left inferior frontal lobe again noted.  IMPRESSION: Resolution of mass effect, edema and shift related to previous left frontal tumor resection and adjacent infarction.  Considerable enhancement in the region of the previous infarction and resection that I think is largely explainable by enhancement within a subacute infarction. Small amount of residual or recurrent tumor could be hidden amongst this post infarction enhancement, but there is no evidence of mass effect. Continued follow-up will be necessary. No new lesion otherwise.   Electronically Signed   By: Nelson Chimes M.D.   On: 02/14/2015 16:28    Impression:  The patient is recovering from the effects of radiation. Recent brain scan shows evidence of post-operative ischemia which appears to be evolving.  Recovering from  the effects of surgery. Current MRI shows no evidence of cancer or metastases.   Plan:  Continue to recover. Follow-up with MRI in 3 months.  Taper off Keppra with 500 daily for 7 days then stop.  _____________________________________  Sheral Apley. Tammi Klippel, M.D.   This document serves as a record of services personally performed by Tyler Pita, MD. It was created on his behalf by Derek Mound, a trained medical scribe. The creation of this record is based on the scribe's personal observations and the provider's statements to them. This document has been checked and approved by the attending provider.

## 2015-02-17 NOTE — Progress Notes (Signed)
Weight and vitals stable. Denies pain. Reports nurse comes to the home once per week. Also, reports that a physical, speech and occupational therapist come to the home. Patient able to ambulate up steps. Denies taking decadron. Reports taking keppra 500 mg bid. Requesting refill of Keppra. Denies headaches, dizziness, nausea, vomiting, diplopia or ringing in the ears. Oxygen therapy 2 liters via nasal cannula. Report muffled hearing in her left ear x 3 days. Wax build up noted left ear.  Reports cough has resolved.

## 2015-02-18 ENCOUNTER — Telehealth: Payer: Self-pay | Admitting: Radiation Oncology

## 2015-02-18 NOTE — Telephone Encounter (Signed)
Spoke with April at [facility] confirming all questions about medications have been clarified.

## 2015-02-18 NOTE — Telephone Encounter (Signed)
-----   Message from Guerry Minors sent at 02/17/2015  4:13 PM EDT ----- Regarding: Allen I believe she is a Dr. Tammi Klippel patient.  Thanks, Santiago Glad

## 2015-02-26 ENCOUNTER — Telehealth: Payer: Self-pay | Admitting: *Deleted

## 2015-02-26 NOTE — Telephone Encounter (Signed)
Chris with [facility] called reporting "patient's daughter said she spoke with someone, was given okay to wean oxygen.  We called PCP and were instructed to call the cancer center for these orders.  We currently have oxygen at 2L and need to know what saturation level to maintain and when to stop the oxygen".    Dr. Julien Nordmann notified of call.  Verbal orders received and rad back for [place] to call pulmonologist or provider that ordered oxygen.  Gerald Stabs given this information.

## 2015-03-06 ENCOUNTER — Encounter: Payer: Self-pay | Admitting: Internal Medicine

## 2015-03-12 ENCOUNTER — Other Ambulatory Visit (HOSPITAL_BASED_OUTPATIENT_CLINIC_OR_DEPARTMENT_OTHER): Payer: Medicare Other

## 2015-03-12 ENCOUNTER — Ambulatory Visit (HOSPITAL_COMMUNITY)
Admission: RE | Admit: 2015-03-12 | Discharge: 2015-03-12 | Disposition: A | Discharge status: Home / Self Care | Payer: Medicare Other | Source: Ambulatory Visit | Attending: Internal Medicine | Admitting: Internal Medicine

## 2015-03-12 ENCOUNTER — Encounter (HOSPITAL_COMMUNITY): Payer: Self-pay

## 2015-03-12 DIAGNOSIS — C3492 Malignant neoplasm of unspecified part of left bronchus or lung: Secondary | ICD-10-CM | POA: Insufficient documentation

## 2015-03-12 DIAGNOSIS — Z923 Personal history of irradiation: Secondary | ICD-10-CM | POA: Insufficient documentation

## 2015-03-12 DIAGNOSIS — C7931 Secondary malignant neoplasm of brain: Secondary | ICD-10-CM | POA: Diagnosis not present

## 2015-03-12 DIAGNOSIS — C3432 Malignant neoplasm of lower lobe, left bronchus or lung: Secondary | ICD-10-CM

## 2015-03-12 DIAGNOSIS — Z9221 Personal history of antineoplastic chemotherapy: Secondary | ICD-10-CM | POA: Diagnosis not present

## 2015-03-12 LAB — CBC WITH DIFFERENTIAL/PLATELET
BASO%: 0.4 % (ref 0.0–2.0)
Basophils Absolute: 0 10*3/uL (ref 0.0–0.1)
EOS ABS: 0.2 10*3/uL (ref 0.0–0.5)
EOS%: 3.8 % (ref 0.0–7.0)
HEMATOCRIT: 37.7 % (ref 34.8–46.6)
HGB: 11.8 g/dL (ref 11.6–15.9)
LYMPH#: 1.8 10*3/uL (ref 0.9–3.3)
LYMPH%: 34.8 % (ref 14.0–49.7)
MCH: 29.5 pg (ref 25.1–34.0)
MCHC: 31.3 g/dL — ABNORMAL LOW (ref 31.5–36.0)
MCV: 94.3 fL (ref 79.5–101.0)
MONO#: 0.5 10*3/uL (ref 0.1–0.9)
MONO%: 10.1 % (ref 0.0–14.0)
NEUT#: 2.6 10*3/uL (ref 1.5–6.5)
NEUT%: 50.9 % (ref 38.4–76.8)
Platelets: 151 10*3/uL (ref 145–400)
RBC: 4 10*6/uL (ref 3.70–5.45)
RDW: 13.8 % (ref 11.2–14.5)
WBC: 5 10*3/uL (ref 3.9–10.3)

## 2015-03-12 LAB — COMPREHENSIVE METABOLIC PANEL (CC13)
ALT: 30 U/L (ref 0–55)
ANION GAP: 13 meq/L — AB (ref 3–11)
AST: 30 U/L (ref 5–34)
Albumin: 3.7 g/dL (ref 3.5–5.0)
Alkaline Phosphatase: 56 U/L (ref 40–150)
BILIRUBIN TOTAL: 0.72 mg/dL (ref 0.20–1.20)
BUN: 9.8 mg/dL (ref 7.0–26.0)
CALCIUM: 9.7 mg/dL (ref 8.4–10.4)
CO2: 26 mEq/L (ref 22–29)
Chloride: 103 mEq/L (ref 98–109)
Creatinine: 0.7 mg/dL (ref 0.6–1.1)
EGFR: 82 mL/min/{1.73_m2} — ABNORMAL LOW (ref >90)
Glucose: 114 mg/dl (ref 70–140)
POTASSIUM: 4.2 meq/L (ref 3.5–5.1)
Sodium: 142 mEq/L (ref 136–145)
TOTAL PROTEIN: 6.8 g/dL (ref 6.4–8.3)

## 2015-03-12 MED ORDER — IOHEXOL 300 MG/ML  SOLN
100.0000 mL | Freq: Once | INTRAMUSCULAR | Status: AC | PRN
  Administered 2015-03-12: 100 mL via INTRAVENOUS

## 2015-03-19 ENCOUNTER — Ambulatory Visit (HOSPITAL_BASED_OUTPATIENT_CLINIC_OR_DEPARTMENT_OTHER): Payer: Medicare Other | Admitting: Internal Medicine

## 2015-03-19 ENCOUNTER — Telehealth: Payer: Self-pay | Admitting: Internal Medicine

## 2015-03-19 ENCOUNTER — Encounter: Payer: Self-pay | Admitting: Internal Medicine

## 2015-03-19 VITALS — BP 109/42 | HR 78 | Temp 98.3°F | Resp 17 | Ht 61.0 in | Wt 130.0 lb

## 2015-03-19 DIAGNOSIS — C3412 Malignant neoplasm of upper lobe, left bronchus or lung: Secondary | ICD-10-CM

## 2015-03-19 DIAGNOSIS — C3432 Malignant neoplasm of lower lobe, left bronchus or lung: Secondary | ICD-10-CM

## 2015-03-19 DIAGNOSIS — C7931 Secondary malignant neoplasm of brain: Secondary | ICD-10-CM

## 2015-03-19 NOTE — Telephone Encounter (Signed)
per pof to sch pt appt-gave pt copy of sch-adv [facility] will call to sch scan

## 2015-03-19 NOTE — Progress Notes (Signed)
[facility] [phone]   Fax:([phone]) [place], [place] [address]  DIAGNOSIS: Metastatic non-small cell lung cancer initially diagnosed as Unresectable Stage IIB (T3, N0, M0) non-small cell lung cancer, poorly differentiated squamous cell carcinoma diagnosed in June of 2014.  The patient was diagnosed with metastatic lesion to the brain in January 2016.  PRIOR THERAPY:  1) Concurrent chemoradiation with weekly carboplatin for AUC of 2 and paclitaxel 45 mg/M2. Status post 5 cycles. First cycle was given on 04/09/2013. Last dose was given on 05/21/2013 with partial response.  2) Consolidation chemotherapy with carboplatin for AUC of 4 and paclitaxel 150 mg/M2 every 3 weeks with Neulasta support. First dose given on 07/31/2013. Status post 3 cycles with stable disease. 3) status post a stereotactic radiotherapy under the care of Dr. Tammi Klippel on 11/06/2014. 4) status post craniotomy with tumor resection under the care of Dr. Vertell Limber on 11/08/2014.  CURRENT THERAPY: Observation.  CHEMOTHERAPY INTENT: Control/curative  CURRENT # OF CHEMOTHERAPY CYCLES: 0 CURRENT ANTIEMETICS: Zofran, dexamethasone and Compazine  CURRENT SMOKING STATUS: Former smoker  ORAL CHEMOTHERAPY AND CONSENT: None  CURRENT BISPHOSPHONATES USE: None  PAIN MANAGEMENT: 0/10  NARCOTICS INDUCED CONSTIPATION: None  LIVING WILL AND CODE STATUS: Full code   INTERVAL HISTORY: Chelsea Cruz 74 y.o. female returns to the clinic today for followup visit accompanied by her 2 daughters. The patient is doing fine today with no specific complaints except for the baseline shortness breath increased with exertion. She was [facility] 12/[facility]/2014 was pulmonary embolism as well as questionable pneumonia and COPD. She was started on treatment with Lovenox subcutaneously. Her daughter gives her the injection. She denied  having any significant chest pain, or hemoptysis. The patient denied having any nausea or vomiting. She denied having any fever or chills. She has no weight loss or night sweats. She had repeat CT scan of the chest performed recently and she is here for evaluation and discussion of her scan results.  MEDICAL HISTORY: Past Medical History  Diagnosis Date  . Nodule of left lung   . Hypertension   . Nicotine addiction   . Osteopenia   . Osteomalacia   . Impaired fasting glucose   . Hypercholesteremia   . Depression   . COPD (chronic obstructive pulmonary disease)   . GERD (gastroesophageal reflux disease)   . Lung cancer dx'd 02/2013  . 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    ALLERGIES:  is allergic to penicillins.  MEDICATIONS:  Current Outpatient Prescriptions  Medication Sig Dispense Refill  . acetaminophen (TYLENOL) 325 MG tablet Take 650 mg by mouth every 6 (six) hours as needed (for pain.).    Marland Kitchen cholecalciferol (VITAMIN D) 400 UNITS TABS tablet Take 400 Units by mouth.    . enoxaparin (LOVENOX) 80 MG/0.8ML injection Inject 0.8 mLs (80 mg total) into the skin daily. 22.4 Syringe   . metoprolol tartrate (LOPRESSOR) 25 MG tablet Take 1 tablet (25 mg total) by mouth 2 (two) times daily.    . OXYGEN Inhale 1.5 L into the lungs.     No current facility-administered medications for this visit.    SURGICAL HISTORY:  Past Surgical History  Procedure Laterality Date  . Cholecystectomy    . Right oophorectomy      1969  . Cataract extraction w/ intraocular lens  implant, bilateral    .  Bilateral carpal tunnel release    . Appendectomy    . Craniotomy N/A 11/08/2014    Procedure: CRANIOTOMY TUMOR EXCISION;  Surgeon: Erline Levine, MD;  Location: [facility];  Service: Neurosurgery;  Laterality: N/A;  CRANIOTOMY TUMOR EXCISION  . Brain surgery      tumor removal 11/08/2014    REVIEW OF SYSTEMS:  A comprehensive review of systems was negative  except for: Respiratory: positive for dyspnea on exertion Musculoskeletal: positive for muscle weakness Neurological: positive for weakness   PHYSICAL EXAMINATION: General appearance: alert, cooperative and no distress Head: Normocephalic, without obvious abnormality, atraumatic Neck: no adenopathy, no JVD, supple, symmetrical, trachea midline and thyroid not enlarged, symmetric, no tenderness/mass/nodules Lymph nodes: Cervical, supraclavicular, and axillary nodes normal. Resp: clear to auscultation bilaterally Back: symmetric, no curvature. ROM normal. No CVA tenderness. Cardio: regular rate and rhythm, S1, S2 normal, no murmur, click, rub or gallop GI: soft, non-tender; bowel sounds normal; no masses,  no organomegaly Extremities: extremities normal, atraumatic, no cyanosis or edema Neurologic: Alert and oriented X 3, normal strength and tone. Normal symmetric reflexes. Normal coordination and gait  ECOG PERFORMANCE STATUS: 2 - Symptomatic, <50% confined to bed  Blood pressure 109/42, pulse 78, temperature 98.3 F (36.8 C), temperature source Oral, resp. rate 17, height _0  (1.549 m), weight 130 lb (58.968 kg), SpO2 100 %.  LABORATORY DATA: Lab Results  Component Value Date   WBC 5.0 03/12/2015   HGB 11.8 03/12/2015   HCT 37.7 03/12/2015   MCV 94.3 03/12/2015   PLT 151 03/12/2015      Chemistry      Component Value Date/Time   NA 142 03/12/2015 0807   NA 136 12/28/2014 0602   K 4.2 03/12/2015 0807   K 3.3* 12/28/2014 0602   CL 99 12/28/2014 0602   CL 98 04/09/2013 1114   CO2 26 03/12/2015 0807   CO2 31 12/28/2014 0602   BUN 9.8 03/12/2015 0807   BUN 11 12/28/2014 0602   CREATININE 0.7 03/12/2015 0807   CREATININE 0.49* 12/28/2014 0602      Component Value Date/Time   CALCIUM 9.7 03/12/2015 0807   CALCIUM 8.3* 12/28/2014 0602   ALKPHOS 56 03/12/2015 0807   ALKPHOS 40 12/28/2014 0602   AST 30 03/12/2015 0807   AST 23 12/28/2014 0602   ALT 30 03/12/2015 0807    ALT 40* 12/28/2014 0602   BILITOT 0.72 03/12/2015 0807   BILITOT 1.0 12/28/2014 0602       RADIOGRAPHIC STUDIES: Ct Chest W Contrast  03/12/2015   CLINICAL DATA:  Left-sided lung cancer with brain metastasis diagnosed in 2014. Previous chemotherapy and radiation therapy. Left lower lobe primary.  EXAM: CT CHEST, ABDOMEN, AND PELVIS WITH CONTRAST  TECHNIQUE: Multidetector CT imaging of the chest, abdomen and pelvis was performed following the standard protocol during bolus administration of intravenous contrast.  CONTRAST:  165m OMNIPAQUE IOHEXOL 300 MG/ML  SOLN  COMPARISON:  12/24/2014 chest CT. 12/05/2014 chest CT. PET of 03/16/2013. No recent abdominal pelvic diagnostic CTs.  FINDINGS: CT CHEST FINDINGS  Mediastinum/Nodes: No supraclavicular adenopathy. Aortic and branch vessel atherosclerosis. Mild cardiomegaly. Trace posterior pericardial fluid is similar and likely physiologic. No well-defined residual pulmonary embolism on this nondedicated study.  No mediastinal adenopathy.  Lungs/Pleura: Small left pleural effusion is similar.  Moderate centrilobular emphysema. New volume loss at the anterior right lung base. left perihilar and infrahilar radiation fibrosis is similar. No well-defined recurrent mass. Soft tissue thickening along the left pulmonary artery branches  is similar and also presumably radiation induced.  Musculoskeletal: No acute osseous abnormality.Sclerosis within the anterior left-sided ribs is chronic and likely related to remote trauma.  CT ABDOMEN AND PELVIS FINDINGS  Hepatobiliary: Normal liver. Cholecystectomy, without biliary ductal dilatation.  Pancreas: Normal, without mass or ductal dilatation.  Spleen: Normal  Adrenals/Urinary Tract: Normal adrenal glands. Normal kidneys, without hydronephrosis. Bladder wall thickening and mucosal hyper enhancement is mild on image 106.  Stomach/Bowel: Normal stomach, without wall thickening. Normal colon and terminal ileum. Normal small  bowel.  Vascular/Lymphatic: Aortic and branch vessel atherosclerosis. No abdominopelvic adenopathy.  Reproductive: Normal uterus and adnexa.  Other: No significant free fluid. No evidence of omental or peritoneal disease. Soft tissue nodularity and gas about the anterior abdominal wall is presumably iatrogenic.  Musculoskeletal: No acute osseous abnormality.  IMPRESSION: 1. Similar appearance of radiation fibrosis within the medial left lung. Similar small left pleural effusion. 2. No evidence of recurrent or metastatic disease. 3. Mild bladder wall thickening and mucosal hyper enhancement, suspicious for cystitis.   Electronically Signed   By: Abigail Miyamoto M.D.   On: 03/12/2015 09:48   Ct Abdomen Pelvis W Contrast  03/12/2015   CLINICAL DATA:  Left-sided lung cancer with brain metastasis diagnosed in 2014. Previous chemotherapy and radiation therapy. Left lower lobe primary.  EXAM: CT CHEST, ABDOMEN, AND PELVIS WITH CONTRAST  TECHNIQUE: Multidetector CT imaging of the chest, abdomen and pelvis was performed following the standard protocol during bolus administration of intravenous contrast.  CONTRAST:  113m OMNIPAQUE IOHEXOL 300 MG/ML  SOLN  COMPARISON:  12/24/2014 chest CT. 12/05/2014 chest CT. PET of 03/16/2013. No recent abdominal pelvic diagnostic CTs.  FINDINGS: CT CHEST FINDINGS  Mediastinum/Nodes: No supraclavicular adenopathy. Aortic and branch vessel atherosclerosis. Mild cardiomegaly. Trace posterior pericardial fluid is similar and likely physiologic. No well-defined residual pulmonary embolism on this nondedicated study.  No mediastinal adenopathy.  Lungs/Pleura: Small left pleural effusion is similar.  Moderate centrilobular emphysema. New volume loss at the anterior right lung base. left perihilar and infrahilar radiation fibrosis is similar. No well-defined recurrent mass. Soft tissue thickening along the left pulmonary artery branches is similar and also presumably radiation induced.   Musculoskeletal: No acute osseous abnormality.Sclerosis within the anterior left-sided ribs is chronic and likely related to remote trauma.  CT ABDOMEN AND PELVIS FINDINGS  Hepatobiliary: Normal liver. Cholecystectomy, without biliary ductal dilatation.  Pancreas: Normal, without mass or ductal dilatation.  Spleen: Normal  Adrenals/Urinary Tract: Normal adrenal glands. Normal kidneys, without hydronephrosis. Bladder wall thickening and mucosal hyper enhancement is mild on image 106.  Stomach/Bowel: Normal stomach, without wall thickening. Normal colon and terminal ileum. Normal small bowel.  Vascular/Lymphatic: Aortic and branch vessel atherosclerosis. No abdominopelvic adenopathy.  Reproductive: Normal uterus and adnexa.  Other: No significant free fluid. No evidence of omental or peritoneal disease. Soft tissue nodularity and gas about the anterior abdominal wall is presumably iatrogenic.  Musculoskeletal: No acute osseous abnormality.  IMPRESSION: 1. Similar appearance of radiation fibrosis within the medial left lung. Similar small left pleural effusion. 2. No evidence of recurrent or metastatic disease. 3. Mild bladder wall thickening and mucosal hyper enhancement, suspicious for cystitis.   Electronically Signed   By: KAbigail MiyamotoM.D.   On: 03/12/2015 09:48   ASSESSMENT AND PLAN:  She is a very pleasant 74years old white female with metastatic non-small cell lung cancer initially diagnosed as unresectable stage IIB non-small cell lung cancer. She status post concurrent chemoradiation and recently completed 3 cycles of  consolidation chemotherapy with carboplatin and paclitaxel. She was recently found to have metastatic brain lesion in January 2016 and the patient underwent stereotactic radiotherapy followed by surgical resection of the brain tumor. She is slowly recovering and continues to have weakness in the right lower extremity. She is followed by Dr. Tammi Klippel for her brain metastasis and expected to  have repeat MRI of the brain in August 2016. The recent CT scan of the chest showed no evidence for disease recurrence in the chest. I discussed the scan results with the patient and her daughters today. I recommended for her to continue on observation for now with repeat CT scan of the chest, abdomen and pelvis in 4 months.  She was advised to call immediately if she has any concerning symptoms in the interval.  The patient voices understanding of current disease status and treatment options and is in agreement with the current care plan.  All questions were answered. The patient knows to call the clinic with any problems, questions or concerns. We can certainly see the patient much sooner if necessary.  Disclaimer: This note was dictated with voice recognition software. Similar sounding words can inadvertently be transcribed and may not be corrected upon review.   Eilleen Kempf., MD 03/19/2015

## 2015-05-21 ENCOUNTER — Other Ambulatory Visit: Payer: Self-pay | Admitting: Radiation Therapy

## 2015-05-21 DIAGNOSIS — C7931 Secondary malignant neoplasm of brain: Secondary | ICD-10-CM

## 2015-05-22 ENCOUNTER — Ambulatory Visit
Admission: RE | Admit: 2015-05-22 | Discharge: 2015-05-22 | Disposition: A | Discharge status: Home / Self Care | Payer: Medicare Other | Source: Ambulatory Visit | Attending: Radiation Oncology | Admitting: Radiation Oncology

## 2015-05-22 ENCOUNTER — Other Ambulatory Visit: Payer: Medicare Other

## 2015-05-22 ENCOUNTER — Ambulatory Visit: Payer: Medicare Other

## 2015-05-22 DIAGNOSIS — C7931 Secondary malignant neoplasm of brain: Secondary | ICD-10-CM

## 2015-05-22 LAB — BUN AND CREATININE (CC13)
BUN: 13.6 mg/dL (ref 7.0–26.0)
Creatinine: 1.1 mg/dL (ref 0.6–1.1)
EGFR: 52 mL/min/{1.73_m2} — AB (ref >90)

## 2015-05-22 MED ORDER — GADOBENATE DIMEGLUMINE 529 MG/ML IV SOLN
12.0000 mL | Freq: Once | INTRAVENOUS | Status: AC | PRN
  Administered 2015-05-22: 12 mL via INTRAVENOUS

## 2015-05-23 ENCOUNTER — Other Ambulatory Visit: Payer: Medicare Other

## 2015-05-26 ENCOUNTER — Ambulatory Visit
Admission: RE | Admit: 2015-05-26 | Discharge: 2015-05-26 | Disposition: A | Discharge status: Home / Self Care | Payer: Medicare Other | Source: Ambulatory Visit | Attending: Radiation Oncology | Admitting: Radiation Oncology

## 2015-05-26 ENCOUNTER — Encounter: Payer: Self-pay | Admitting: Radiation Oncology

## 2015-05-26 VITALS — BP 110/47 | HR 88 | Resp 16 | Wt 128.2 lb

## 2015-05-26 DIAGNOSIS — C7931 Secondary malignant neoplasm of brain: Secondary | ICD-10-CM

## 2015-05-26 NOTE — Progress Notes (Signed)
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    ICD-9-CM ICD-10-CM   1. Solitary Left Frontal 3.4 cm Brain Metastasis 198.3 C79.31      Interval Since Last Radiation:  6 Months  Narrative:  The patient returns today for routine follow-up.  Weight and vitals stable. Denies pain. Steady gait noted. Responds appropriately and quickly to questions. Denies seizure activity. Denies taking decadron or keppra. Denies headache, dizziness, nausea, vomiting, diplopia or ringing in the ears. Reports using oxygen therapy 2 liters via nasal cannula at night. Patient states, "I am almost back to normal."                 ALLERGIES:  is allergic to penicillins.  Meds: Current Outpatient Prescriptions  Medication Sig Dispense Refill  . cholecalciferol (VITAMIN D) 400 UNITS TABS tablet Take 400 Units by mouth.    . enoxaparin (LOVENOX) 80 MG/0.8ML injection Inject 0.8 mLs (80 mg total) into the skin daily. 22.4 Syringe   . metoprolol tartrate (LOPRESSOR) 25 MG tablet Take 1 tablet (25 mg total) by mouth 2 (two) times daily. (Patient taking differently: Take 12.5 mg by mouth once. )    . OXYGEN Inhale 1.5 L into the lungs.    Marland Kitchen acetaminophen (TYLENOL) 325 MG tablet Take 650 mg by mouth every 6 (six) hours as needed (for pain.).     No current facility-administered medications for this encounter.    Physical Findings: The patient is in no acute distress. Patient is alert and oriented.  weight is 128 lb 3.2 oz (58.151 kg). Her blood pressure is 110/47 and her pulse is 88. Her respiration is 16 and oxygen saturation is 100%. . No significant changes.   Lab  Findings: Lab Results  Component Value Date   WBC 5.0 03/12/2015   WBC 4.8 12/29/2014   HGB 11.8 03/12/2015   HGB 9.9* 12/29/2014   HCT 37.7 03/12/2015   HCT 30.6* 12/29/2014   PLT 151 03/12/2015   PLT 201 12/29/2014    Lab Results  Component Value Date   NA 142 03/12/2015   NA 136 12/28/2014   K 4.2 03/12/2015   K 3.3* 12/28/2014   CHLORIDE 103 03/12/2015   CO2 26 03/12/2015   CO2 31 12/28/2014   GLUCOSE 114 03/12/2015   GLUCOSE 99 12/28/2014   GLUCOSE 102* 04/09/2013   BUN 13.6 05/22/2015   BUN 11 12/28/2014   CREATININE 1.1 05/22/2015   CREATININE 0.49* 12/28/2014   BILITOT 0.72 03/12/2015   BILITOT 1.0 12/28/2014   ALKPHOS 56 03/12/2015   ALKPHOS 40 12/28/2014   AST 30 03/12/2015   AST 23 12/28/2014   ALT 30 03/12/2015   ALT 40* 12/28/2014   PROT 6.8 03/12/2015   PROT 5.0* 12/28/2014   ALBUMIN 3.7 03/12/2015   ALBUMIN 2.2* 12/28/2014   CALCIUM 9.7 03/12/2015   CALCIUM 8.3* 12/28/2014   ANIONGAP 13* 03/12/2015   ANIONGAP 6 12/28/2014    Radiographic Findings: Mr Chelsea Cruz Wo Contrast  05/22/2015   CLINICAL DATA:  Resection of metastatic squamous cell carcinoma of the brain. The  EXAM: MRI HEAD WITHOUT AND WITH  CONTRAST  TECHNIQUE: Multiplanar, multiecho pulse sequences of the brain and surrounding structures were obtained without and with intravenous contrast.  CONTRAST:  39m MULTIHANCE GADOBENATE DIMEGLUMINE 529 MG/ML IV SOLN  COMPARISON:  None.  FINDINGS: A left frontal tumor resection is again noted. Restricted diffusion and T1 shortening is again noted within the resection cavity. This likely represents some residual blood products. There is also cortical laminar necrosis. Some residual enhancement within the resection cavity is noted along the superior aspect of the resection cavity. This does not appear similar to the preoperative tumor and there is no significant expansion of the enhancing soft tissue. There is some contraction. There is also mi enhancement  and dural thickening along the left frontal lobe and left temporal lobe. No new areas of enhancement are present.  Developmental venous anomaly is noted in the anterior left frontal lobe and in the posterior high right frontal lobe.  Flow is present in the major intracranial arteries. Mild atrophy and white matter changes are otherwise stable.  The paranasal sinuses are clear. There is some fluid in the left sphenoid sinuses. No obstructing nasopharyngeal lesion is evident.  IMPRESSION: 1. Decreasing size of residual blood products and soft tissue within the surgical cavity of the anterior left frontal lobe. 2. T1 shortening within the surgical cavity is related to cortical laminar necrosis and blood products. 3. Mild enhancement along the superior aspect of the surgical cavity is likely still reactive. Residual or recurrent tumor is considered less likely given overall decrease in size of the postoperative contents. 4. Stable atrophy and diffuse white matter disease. 5. No acute intracranial abnormality.   Electronically Signed   By: CSan MorelleM.D.   On: 05/22/2015 13:16    Impression:  The patient is recovering from the effects of radiation. Current MRI shows no evidence of recurrent cancer or new metastases.   Plan:   Follow-up with MRI in 3 months. Continues to follow-up with medical oncology.   _____________________________________  MSheral ApleyMTammi Cruz M.D.   This document serves as a record of services personally performed by MTyler Pita MD. It was created on his behalf by ADerek Cruz a trained medical scribe. The creation of this record is based on the scribe's personal observations and the provider's statements to them. This document has been checked and approved by the attending provider.

## 2015-05-26 NOTE — Progress Notes (Signed)
Weight and vitals stable. Denies pain. Steady gait noted. Responds appropriately and quickly to questions. Denies seizure activity. Denies taking decadron or keppra. Denies headache, dizziness, nausea, vomiting, diplopia or ringing in the ears. Reports using oxygen therapy 2 liters via nasal cannula at night. Patient states, "I am almost back to normal."  BP 110/47 mmHg  Pulse 88  Resp 16  Wt 128 lb 3.2 oz (58.151 kg)  SpO2 100% Wt Readings from Last 3 Encounters:  05/26/15 128 lb 3.2 oz (58.151 kg)  05/22/15 130 lb (58.968 kg)  03/19/15 130 lb (58.968 kg)

## 2015-07-16 ENCOUNTER — Encounter: Payer: Self-pay | Admitting: *Deleted

## 2015-07-16 ENCOUNTER — Telehealth: Payer: Self-pay | Admitting: *Deleted

## 2015-07-16 NOTE — Telephone Encounter (Signed)
Call back to Dr. Leonides Schanz per MD pt to continue long term Lovenox or Xerelto. Dr. Leonides Schanz verbalized she will go with Alen Blew. Thanked me for the call back. No further concerns.

## 2015-07-16 NOTE — Telephone Encounter (Signed)
-----   Message from Curt Bears, MD sent at 07/16/2015  8:26 AM EDT ----- Regarding: RE: Lovenox rx She needs to continue with Lovenox or switch to Xarelto ----- Message -----    From: Lucile Crater, RN    Sent: 07/15/2015   4:24 PM      To: Curt Bears, MD, Ardeen Garland, RN Subject: Lovenox rx                                     Hi Dr. Julien Nordmann,  Pt's  PCP called Dr. Leonides Schanz from [place]; with question regarding her lovenox. Pt is due for refill, will pt need to continue this long term or can this be stopped as pt has been on x 6 months.  Pt last seen 6/1, next f/u 10/5 with labs/CT prior  Dr. Leonides Schanz cell# for return call [phone]  Thanks, [facility]

## 2015-07-17 ENCOUNTER — Other Ambulatory Visit (HOSPITAL_BASED_OUTPATIENT_CLINIC_OR_DEPARTMENT_OTHER): Payer: Medicare Other

## 2015-07-17 ENCOUNTER — Ambulatory Visit (HOSPITAL_COMMUNITY)
Admission: RE | Admit: 2015-07-17 | Discharge: 2015-07-17 | Disposition: A | Discharge status: Home / Self Care | Payer: Medicare Other | Source: Ambulatory Visit | Attending: Internal Medicine | Admitting: Internal Medicine

## 2015-07-17 DIAGNOSIS — J90 Pleural effusion, not elsewhere classified: Secondary | ICD-10-CM | POA: Insufficient documentation

## 2015-07-17 DIAGNOSIS — C7931 Secondary malignant neoplasm of brain: Secondary | ICD-10-CM | POA: Diagnosis not present

## 2015-07-17 DIAGNOSIS — C3432 Malignant neoplasm of lower lobe, left bronchus or lung: Secondary | ICD-10-CM | POA: Diagnosis not present

## 2015-07-17 DIAGNOSIS — R911 Solitary pulmonary nodule: Secondary | ICD-10-CM | POA: Diagnosis not present

## 2015-07-17 DIAGNOSIS — R590 Localized enlarged lymph nodes: Secondary | ICD-10-CM | POA: Diagnosis not present

## 2015-07-17 DIAGNOSIS — J439 Emphysema, unspecified: Secondary | ICD-10-CM | POA: Diagnosis not present

## 2015-07-17 DIAGNOSIS — C3412 Malignant neoplasm of upper lobe, left bronchus or lung: Secondary | ICD-10-CM | POA: Diagnosis not present

## 2015-07-17 LAB — CBC WITH DIFFERENTIAL/PLATELET
BASO%: 1.3 % (ref 0.0–2.0)
BASOS ABS: 0.1 10*3/uL (ref 0.0–0.1)
EOS%: 4.2 % (ref 0.0–7.0)
Eosinophils Absolute: 0.3 10*3/uL (ref 0.0–0.5)
HCT: 36.9 % (ref 34.8–46.6)
HEMOGLOBIN: 11.9 g/dL (ref 11.6–15.9)
LYMPH#: 2 10*3/uL (ref 0.9–3.3)
LYMPH%: 32.5 % (ref 14.0–49.7)
MCH: 29.2 pg (ref 25.1–34.0)
MCHC: 32.4 g/dL (ref 31.5–36.0)
MCV: 90.2 fL (ref 79.5–101.0)
MONO#: 0.4 10*3/uL (ref 0.1–0.9)
MONO%: 6.7 % (ref 0.0–14.0)
NEUT%: 55.3 % (ref 38.4–76.8)
NEUTROS ABS: 3.3 10*3/uL (ref 1.5–6.5)
Platelets: 227 10*3/uL (ref 145–400)
RBC: 4.09 10*6/uL (ref 3.70–5.45)
RDW: 14.5 % (ref 11.2–14.5)
WBC: 6 10*3/uL (ref 3.9–10.3)

## 2015-07-17 LAB — COMPREHENSIVE METABOLIC PANEL (CC13)
ALT: 21 U/L (ref 0–55)
ANION GAP: 6 meq/L (ref 3–11)
AST: 17 U/L (ref 5–34)
Albumin: 3.8 g/dL (ref 3.5–5.0)
Alkaline Phosphatase: 63 U/L (ref 40–150)
BILIRUBIN TOTAL: 0.71 mg/dL (ref 0.20–1.20)
BUN: 13.2 mg/dL (ref 7.0–26.0)
CALCIUM: 9.7 mg/dL (ref 8.4–10.4)
CO2: 28 meq/L (ref 22–29)
Chloride: 107 mEq/L (ref 98–109)
Creatinine: 0.8 mg/dL (ref 0.6–1.1)
EGFR: 69 mL/min/{1.73_m2} — AB (ref >90)
Glucose: 109 mg/dl (ref 70–140)
Potassium: 4.4 mEq/L (ref 3.5–5.1)
Sodium: 141 mEq/L (ref 136–145)
TOTAL PROTEIN: 6.8 g/dL (ref 6.4–8.3)

## 2015-07-17 MED ORDER — IOHEXOL 300 MG/ML  SOLN
75.0000 mL | Freq: Once | INTRAMUSCULAR | Status: AC | PRN
  Administered 2015-07-17: 75 mL via INTRAVENOUS

## 2015-07-23 ENCOUNTER — Ambulatory Visit (HOSPITAL_BASED_OUTPATIENT_CLINIC_OR_DEPARTMENT_OTHER): Payer: Medicare Other | Admitting: Internal Medicine

## 2015-07-23 ENCOUNTER — Telehealth: Payer: Self-pay | Admitting: Internal Medicine

## 2015-07-23 ENCOUNTER — Encounter: Payer: Self-pay | Admitting: Internal Medicine

## 2015-07-23 VITALS — BP 126/54 | HR 85 | Temp 98.6°F | Resp 18 | Ht 61.0 in | Wt 128.4 lb

## 2015-07-23 DIAGNOSIS — I2699 Other pulmonary embolism without acute cor pulmonale: Secondary | ICD-10-CM | POA: Diagnosis not present

## 2015-07-23 DIAGNOSIS — C7931 Secondary malignant neoplasm of brain: Secondary | ICD-10-CM | POA: Diagnosis not present

## 2015-07-23 DIAGNOSIS — C3412 Malignant neoplasm of upper lobe, left bronchus or lung: Secondary | ICD-10-CM | POA: Diagnosis not present

## 2015-07-23 DIAGNOSIS — Z7901 Long term (current) use of anticoagulants: Secondary | ICD-10-CM

## 2015-07-23 DIAGNOSIS — C3432 Malignant neoplasm of lower lobe, left bronchus or lung: Secondary | ICD-10-CM

## 2015-07-23 DIAGNOSIS — Z66 Do not resuscitate: Secondary | ICD-10-CM

## 2015-07-23 HISTORY — DX: Do not resuscitate: Z66

## 2015-07-23 NOTE — Progress Notes (Signed)
[place] [phone]   Fax:(336) [place], MD [place] [address]  DIAGNOSIS: Metastatic non-small cell lung cancer initially diagnosed as Unresectable Stage IIB (T3, N0, M0) non-small cell lung cancer, poorly differentiated squamous cell carcinoma diagnosed in June of 2014.  The patient was diagnosed with metastatic lesion to the brain in January 2016.  PRIOR THERAPY:  1) Concurrent chemoradiation with weekly carboplatin for AUC of 2 and paclitaxel 45 mg/M2. Status post 5 cycles. First cycle was given on 04/09/2013. Last dose was given on 05/21/2013 with partial response.  2) Consolidation chemotherapy with carboplatin for AUC of 4 and paclitaxel 150 mg/M2 every 3 weeks with Neulasta support. First dose given on 07/31/2013. Status post 3 cycles with stable disease. 3) status post a stereotactic radiotherapy under the care of Dr. Tammi Klippel on 11/06/2014. 4) status post craniotomy with tumor resection under the care of Dr. Vertell Limber on 11/08/2014.  CURRENT THERAPY:  1) Observation. 2) Xarelto 20 mg by mouth daily for history of pulmonary embolism.  CHEMOTHERAPY INTENT: Control/curative  CURRENT # OF CHEMOTHERAPY CYCLES: 0 CURRENT ANTIEMETICS: Zofran, dexamethasone and Compazine  CURRENT SMOKING STATUS: Former smoker  ORAL CHEMOTHERAPY AND CONSENT: None  CURRENT BISPHOSPHONATES USE: None  PAIN MANAGEMENT: 0/10  NARCOTICS INDUCED CONSTIPATION: None  LIVING WILL AND CODE STATUS: Full code   INTERVAL HISTORY: Chelsea Cruz 74 y.o. female returns to the clinic today for followup visit accompanied by her 2 daughters. The patient is feeling fine today with no specific complaints. She denied having any significant chest pain, shortness breath, cough or hemoptysis. She is currently on Xarelto for history of pulmonary embolism. She denied having any bleeding issues. The patient denied having any  significant weight loss or night sweats. She has no nausea or vomiting. No fever or chills. She had repeat CT scan of the chest performed recently and she is here for evaluation and discussion of her scan results.  MEDICAL HISTORY: Past Medical History  Diagnosis Date  . Nodule of left lung   . Hypertension   . Nicotine addiction   . Osteopenia   . Osteomalacia   . Impaired fasting glucose   . Hypercholesteremia   . Depression   . COPD (chronic obstructive pulmonary disease)   . GERD (gastroesophageal reflux disease)   . Lung cancer dx'd 02/2013  . 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    ALLERGIES:  is allergic to penicillins.  MEDICATIONS:  Current Outpatient Prescriptions  Medication Sig Dispense Refill  . acetaminophen (TYLENOL) 325 MG tablet Take 650 mg by mouth every 6 (six) hours as needed (for pain.).    Marland Kitchen cholecalciferol (VITAMIN D) 400 UNITS TABS tablet Take 400 Units by mouth.    . metoprolol tartrate (LOPRESSOR) 25 MG tablet Take 1 tablet (25 mg total) by mouth 2 (two) times daily. (Patient taking differently: Take 12.5 mg by mouth once. )    . OXYGEN Inhale 1.5 L into the lungs.    . Rivaroxaban (XARELTO PO) Take by mouth.     No current facility-administered medications for this visit.    SURGICAL HISTORY:  Past Surgical History  Procedure Laterality Date  . Cholecystectomy    . Right oophorectomy      1969  . Cataract extraction w/ intraocular lens  implant, bilateral    . Bilateral carpal tunnel release    .  Appendectomy    . Craniotomy N/A 11/08/2014    Procedure: CRANIOTOMY TUMOR EXCISION;  Surgeon: Erline Levine, MD;  Location: [facility];  Service: Neurosurgery;  Laterality: N/A;  CRANIOTOMY TUMOR EXCISION  . Brain surgery      tumor removal 11/08/2014    REVIEW OF SYSTEMS:  A comprehensive review of systems was negative except for: Respiratory: positive for dyspnea on exertion Musculoskeletal: positive for  muscle weakness Neurological: positive for weakness   PHYSICAL EXAMINATION: General appearance: alert, cooperative and no distress Head: Normocephalic, without obvious abnormality, atraumatic Neck: no adenopathy, no JVD, supple, symmetrical, trachea midline and thyroid not enlarged, symmetric, no tenderness/mass/nodules Lymph nodes: Cervical, supraclavicular, and axillary nodes normal. Resp: clear to auscultation bilaterally Back: symmetric, no curvature. ROM normal. No CVA tenderness. Cardio: regular rate and rhythm, S1, S2 normal, no murmur, click, rub or gallop GI: soft, non-tender; bowel sounds normal; no masses,  no organomegaly Extremities: extremities normal, atraumatic, no cyanosis or edema Neurologic: Alert and oriented X 3, normal strength and tone. Normal symmetric reflexes. Normal coordination and gait  ECOG PERFORMANCE STATUS: 0 - Asymptomatic  There were no vitals taken for this visit.  LABORATORY DATA: Lab Results  Component Value Date   WBC 6.0 07/17/2015   HGB 11.9 07/17/2015   HCT 36.9 07/17/2015   MCV 90.2 07/17/2015   PLT 227 07/17/2015      Chemistry      Component Value Date/Time   NA 141 07/17/2015 1551   NA 136 12/28/2014 0602   K 4.4 07/17/2015 1551   K 3.3* 12/28/2014 0602   CL 99 12/28/2014 0602   CL 98 04/09/2013 1114   CO2 28 07/17/2015 1551   CO2 31 12/28/2014 0602   BUN 13.2 07/17/2015 1551   BUN 11 12/28/2014 0602   CREATININE 0.8 07/17/2015 1551   CREATININE 0.49* 12/28/2014 0602      Component Value Date/Time   CALCIUM 9.7 07/17/2015 1551   CALCIUM 8.3* 12/28/2014 0602   ALKPHOS 63 07/17/2015 1551   ALKPHOS 40 12/28/2014 0602   AST 17 07/17/2015 1551   AST 23 12/28/2014 0602   ALT 21 07/17/2015 1551   ALT 40* 12/28/2014 0602   BILITOT 0.71 07/17/2015 1551   BILITOT 1.0 12/28/2014 0602       RADIOGRAPHIC STUDIES: Ct Chest W Contrast  07/17/2015   CLINICAL DATA:  Squamous cell carcinoma of the left lung diagnosed in May 2014  metastatic to the brain in January 2016, status post chemo radiation therapy. Restaging.  EXAM: CT CHEST WITH CONTRAST  TECHNIQUE: Multidetector CT imaging of the chest was performed during intravenous contrast administration.  CONTRAST:  47m OMNIPAQUE IOHEXOL 300 MG/ML  SOLN  COMPARISON:  03/12/2015 chest CT.  FINDINGS: Mediastinum/Nodes: Normal heart size. Mild left-sided pericardial fluid/ thickening measuring up to 11 mm thickness, stable. Right coronary artery calcifications. Atherosclerotic nonaneurysmal thoracic aorta. Normal caliber main pulmonary artery. No central pulmonary emboli. Normal visualized thyroid. Normal esophagus. No axillary lymphadenopathy. No pathologically enlarged mediastinal or right hilar lymph nodes. Stable mildly enlarged 1.0 cm left hilar lymph node (series 2/ image 34), which is confluent with the central left lung consolidation.  Lungs/Pleura: Mild centrilobular and paraseptal emphysema. New 0.8 x 0.7 cm ground-glass right upper lobe pulmonary nodule (Series 7/ image 37). There is a subpleural 0.9 x 0.5 cm anterior basilar right lower lobe pulmonary nodule (series 9/ image 24), decreased from 1.6 x 1.0 cm on 03/12/2015, in keeping with resolving inflammatory focus/ residual postinflammatory scarring.  There is sharply marginated consolidation and volume loss involving the superior segment left lower lobe, lingula and central left upper lobe, not appreciably changed, most in keeping with post treatment change. No pneumothorax. No right pleural effusion. Stable small left basilar pleural effusion. There is stable mild fat stranding and soft tissue thickening of the deep left lateral chest wall soft tissues just superficial to the lateral left ribs at the level of the left pleural effusion, likely representing chronic post treatment change.  Upper abdomen: Stable mild diffuse intrahepatic biliary ductal dilatation status post cholecystectomy, within expected post cholecystectomy  limits.  Musculoskeletal: No aggressive appearing focal osseous lesions. Mild degenerative changes in the thoracic spine. Stable nondisplaced incompletely healed lateral left third through sixth rib fractures.  IMPRESSION: 1. Stable sharply marginated consolidation and volume loss in the lingula, superior segment left lower lobe and central left upper lobe, most in keeping with post treatment change. No evidence of local tumor recurrence. 2. New 0.8 cm ground-glass right upper lobe pulmonary nodule. Initial follow-up by chest CT without contrast is recommended in 3 months to confirm persistence. This recommendation follows the consensus statement: Recommendations for the Management of Subsolid Pulmonary Nodules Detected at CT: A Statement from the [employer] as published in Radiology 2013; 266:304-317. 3. Mild centrilobular and paraseptal emphysema. 4. Stable mild left hilar lymphadenopathy. 5. Stable small left pleural effusion.   Electronically Signed   By: Ilona Sorrel M.D.   On: 07/17/2015 18:35   ASSESSMENT AND PLAN:  She is a very pleasant 74 years old white female with metastatic non-small cell lung cancer initially diagnosed as unresectable stage IIB non-small cell lung cancer. She status post concurrent chemoradiation and recently completed 3 cycles of consolidation chemotherapy with carboplatin and paclitaxel. She was recently found to have metastatic brain lesion in January 2016 and the patient underwent stereotactic radiotherapy followed by surgical resection of the brain tumor.  The patient is feeling fine today was no specific complaints. The recent CT scan of the chest showed no significant evidence for disease progression except for a new 0.8 cm groundglass right upper lobe pulmonary nodule. I discussed the scan results and showed the images to the patient and her daughters. I recommended for her to continue on observation with repeat CT scan of the chest in 3 months. CODE STATUS was  discussed with the patient and she indicated no CODE BLUE. For the history of pulmonary embolism, the patient will continue on Xarelto 20 mg by mouth daily. She was advised to call immediately if she has any concerning symptoms in the interval.  The patient voices understanding of current disease status and treatment options and is in agreement with the current care plan.  All questions were answered. The patient knows to call the clinic with any problems, questions or concerns. We can certainly see the patient much sooner if necessary.  Disclaimer: This note was dictated with voice recognition software. Similar sounding words can inadvertently be transcribed and may not be corrected upon review.   Eilleen Kempf., MD 07/23/2015

## 2015-07-23 NOTE — Telephone Encounter (Signed)
per pof to sch pt appt-gave pt copy of avs-adv pt that [facility]l call to [facility] scan

## 2015-07-23 NOTE — Addendum Note (Signed)
Addended by: Ardeen Garland on: 07/23/2015 09:40 AM   Modules accepted: Orders, Medications

## 2015-08-19 ENCOUNTER — Other Ambulatory Visit: Payer: Self-pay | Admitting: Radiation Therapy

## 2015-08-19 DIAGNOSIS — C7931 Secondary malignant neoplasm of brain: Secondary | ICD-10-CM

## 2015-08-28 ENCOUNTER — Ambulatory Visit
Admission: RE | Admit: 2015-08-28 | Discharge: 2015-08-28 | Disposition: A | Discharge status: Home / Self Care | Payer: Medicare Other | Source: Ambulatory Visit | Attending: Radiation Oncology | Admitting: Radiation Oncology

## 2015-08-28 DIAGNOSIS — C7931 Secondary malignant neoplasm of brain: Secondary | ICD-10-CM

## 2015-08-28 MED ORDER — GADOBENATE DIMEGLUMINE 529 MG/ML IV SOLN
11.0000 mL | Freq: Once | INTRAVENOUS | Status: AC | PRN
  Administered 2015-08-28: 11 mL via INTRAVENOUS

## 2015-08-31 ENCOUNTER — Encounter: Payer: Self-pay | Admitting: Radiation Therapy

## 2015-09-01 ENCOUNTER — Ambulatory Visit
Admission: RE | Admit: 2015-09-01 | Discharge: 2015-09-01 | Disposition: A | Discharge status: Home / Self Care | Payer: Medicare Other | Source: Ambulatory Visit | Attending: Radiation Oncology | Admitting: Radiation Oncology

## 2015-09-01 ENCOUNTER — Encounter: Payer: Self-pay | Admitting: Radiation Oncology

## 2015-09-01 VITALS — BP 128/58 | HR 86 | Resp 16 | Wt 129.5 lb

## 2015-09-01 DIAGNOSIS — C7931 Secondary malignant neoplasm of brain: Secondary | ICD-10-CM

## 2015-09-01 NOTE — Progress Notes (Signed)
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    ICD-9-CM ICD-10-CM   1. Solitary Left Frontal 3.4 cm Brain Metastasis 198.3 C79.31      Interval Since Last Radiation:  10 Months  Narrative:  The patient returns today for routine follow-up. Denies pain. Reports occasional dizziness upon standing. The nurse encouraged the patient to monitor her bp for two weeks because diastolic is low and could be the cause of the dizziness with standing. Reports occasional difficulty finding words and forgetfulness. Redness of left eye noted by the nurse; sparks concern related to effects of xarelto. Reports energy level is ok but, not 100%. Patient reports she no longer uses oxygen therapy while sleeping. Denies headache, dizziness, nausea, vomiting, diplopia or ringing in the ears. Questions if she can begin taking Biotin for thinning hair. On 07/17/15, she had a chest CT that found a new 8 mm ground-glass right upper lobe pulmonary nodule. Repeat CT was recommended.  ALLERGIES:  is allergic to penicillins.  Meds: Current Outpatient Prescriptions  Medication Sig Dispense Refill  . acetaminophen (TYLENOL) 325 MG tablet Take 650 mg by mouth every 6 (six) hours as needed (for pain.).    Marland Kitchen cholecalciferol (VITAMIN D) 400 UNITS TABS tablet Take 400 Units by mouth.    Chelsea Cruz 20 MG TABS tablet   1   No current facility-administered medications for this encounter.    Physical Findings: The patient is in no acute distress. Patient is alert and oriented.  weight is 129 lb 8 oz (58.741 kg). Her blood pressure is 128/58 and her pulse is 86. Her respiration is 16 and  oxygen saturation is 100%. . No significant changes.   Lab Findings: Lab Results  Component Value Date   WBC 6.0 07/17/2015   WBC 4.8 12/29/2014   HGB 11.9 07/17/2015   HGB 9.9* 12/29/2014   HCT 36.9 07/17/2015   HCT 30.6* 12/29/2014   PLT 227 07/17/2015   PLT 201 12/29/2014    Lab Results  Component Value Date   NA 141 07/17/2015   NA 136 12/28/2014   K 4.4 07/17/2015   K 3.3* 12/28/2014   CHLORIDE 107 07/17/2015   CO2 28 07/17/2015   CO2 31 12/28/2014   GLUCOSE 109 07/17/2015   GLUCOSE 99 12/28/2014   GLUCOSE 102* 04/09/2013   BUN 13.2 07/17/2015   BUN 11 12/28/2014   CREATININE 0.8 07/17/2015   CREATININE 0.49* 12/28/2014   BILITOT 0.71 07/17/2015   BILITOT 1.0 12/28/2014   ALKPHOS 63 07/17/2015   ALKPHOS 40 12/28/2014   AST 17 07/17/2015   AST 23 12/28/2014   ALT 21 07/17/2015   ALT 40* 12/28/2014   PROT 6.8 07/17/2015   PROT 5.0* 12/28/2014   ALBUMIN 3.8 07/17/2015   ALBUMIN 2.2* 12/28/2014   CALCIUM 9.7 07/17/2015   CALCIUM 8.3* 12/28/2014   ANIONGAP 6 07/17/2015   ANIONGAP 6 12/28/2014    Radiographic Findings: Mr Chelsea Cruz Wo Contrast  08/28/2015  CLINICAL DATA:  74 year old female with a 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 Restaging. Subsequent encounter. EXAM: MRI HEAD WITHOUT AND WITH CONTRAST TECHNIQUE: Multiplanar, multiecho pulse sequences of the brain and surrounding structures were obtained without and with intravenous contrast. CONTRAST:  11 mm MultiHance. COMPARISON:  06/01/2015, and earlier FINDINGS: Sequelae of left frontotemporal craniotomy and tumor resection re- demonstrated. Intrinsic T1 blood products within the resection cavity re- demonstrated. T2 and FLAIR heterogeneity within and around the resection cavity is unchanged since August. No regional mass effect. Following contrast, the only true area of enhancement is a small nodular focus on  series 10, image 105 which is unchanged. There is smooth dural thickening and enhancement underlying the craniotomy (series 10, image 97) but this also is stable. Elsewhere no abnormal enhancement of the brain. There is a small medial left frontal lobe developmental venous anomaly read straight (10 image 80). No intracranial mass effect. No restricted diffusion or evidence of acute infarction. Stable ventricle size and configuration. No acute intracranial hemorrhage identified. Major intracranial vascular flow voids are stable. Negative pituitary and cervicomedullary junction. Visible cervical spine and spinal cord all appear stable and within normal limits. Normal bone marrow signal. Stable mild mastoid effusion. Other visible internal auditory structures appear normal. Decreased paranasal sinus mucosal thickening. Stable orbit and scalp soft tissues. IMPRESSION: 1. Stable post treatment appearance of the left frontal lobe. Intrinsic T1 hyperintense blood products persist. There is a stable small nodular area of enhancement on series 10, image 105 to which attention is directed on followup studies. Stable surrounding T2 and FLAIR hyperintensity with no mass effect. 2. No second brain metastasis or new intracranial abnormality. Electronically Signed   By: Chelsea Cruz M.D.   On: 08/28/2015 16:03    Impression:  The patient is recovering from the effects of radiation. Current MRI shows no evidence of recurrent cancer or new metastases.   Plan:   Follow-up with MRI in 3 months. She has a CT of the chest on 10/21/14. Continues to follow-up with medical oncology.   _____________________________________  Chelsea Cruz, M.D.  This document serves as a record of services personally performed by Chelsea Pita, MD. It was created on his behalf by Chelsea Cruz, a trained medical scribe. The creation of this record is based on the scribe's personal observations and the provider's statements to them. This document has  been checked and approved by the attending provider.

## 2015-09-01 NOTE — Progress Notes (Signed)
Weight and vitals stable. Denies pain. Reports occasional dizziness upon standing. Encouraged patient monitor bp for two weeks because diastolic is low and could be the cause of dizziness with standing. Reports occasional difficulty finding words. Reports occasional forgetfulness. Redness of left eye noted; sparks concern related to effects of xarelto. Reports energy level is ok but, not 100%. Patient reports she no longer uses oxygen therapy while sleeping. Denies headache, dizziness, nausea, vomiting, diplopia or ringing in the ears. Questions if she can begin taking Biotin for thin hair.   BP 128/58 mmHg  Pulse 86  Resp 16  Wt 129 lb 8 oz (58.741 kg)  SpO2 100% Wt Readings from Last 3 Encounters:  09/01/15 129 lb 8 oz (58.741 kg)  08/28/15 128 lb (58.06 kg)  07/23/15 128 lb 6.4 oz (58.242 kg)

## 2015-10-22 ENCOUNTER — Ambulatory Visit (HOSPITAL_COMMUNITY)
Admission: RE | Admit: 2015-10-22 | Discharge: 2015-10-22 | Disposition: A | Discharge status: Home / Self Care | Payer: Medicare Other | Source: Ambulatory Visit | Attending: Internal Medicine | Admitting: Internal Medicine

## 2015-10-22 ENCOUNTER — Other Ambulatory Visit (HOSPITAL_BASED_OUTPATIENT_CLINIC_OR_DEPARTMENT_OTHER): Payer: Medicare Other

## 2015-10-22 ENCOUNTER — Other Ambulatory Visit: Payer: Self-pay | Admitting: *Deleted

## 2015-10-22 ENCOUNTER — Telehealth: Payer: Self-pay | Admitting: *Deleted

## 2015-10-22 DIAGNOSIS — Z66 Do not resuscitate: Secondary | ICD-10-CM | POA: Insufficient documentation

## 2015-10-22 DIAGNOSIS — C3412 Malignant neoplasm of upper lobe, left bronchus or lung: Secondary | ICD-10-CM

## 2015-10-22 DIAGNOSIS — C3432 Malignant neoplasm of lower lobe, left bronchus or lung: Secondary | ICD-10-CM

## 2015-10-22 DIAGNOSIS — C7949 Secondary malignant neoplasm of other parts of nervous system: Principal | ICD-10-CM

## 2015-10-22 DIAGNOSIS — C7931 Secondary malignant neoplasm of brain: Secondary | ICD-10-CM | POA: Insufficient documentation

## 2015-10-22 LAB — COMPREHENSIVE METABOLIC PANEL
ALT: 25 U/L (ref 0–55)
AST: 20 U/L (ref 5–34)
Albumin: 3.9 g/dL (ref 3.5–5.0)
Alkaline Phosphatase: 85 U/L (ref 40–150)
Anion Gap: 10 mEq/L (ref 3–11)
BUN: 17.6 mg/dL (ref 7.0–26.0)
CHLORIDE: 106 meq/L (ref 98–109)
CO2: 27 meq/L (ref 22–29)
CREATININE: 0.9 mg/dL (ref 0.6–1.1)
Calcium: 9.7 mg/dL (ref 8.4–10.4)
EGFR: 60 mL/min/{1.73_m2} — ABNORMAL LOW (ref >90)
GLUCOSE: 113 mg/dL (ref 70–140)
Potassium: 4 mEq/L (ref 3.5–5.1)
SODIUM: 143 meq/L (ref 136–145)
Total Bilirubin: 0.93 mg/dL (ref 0.20–1.20)
Total Protein: 7.2 g/dL (ref 6.4–8.3)

## 2015-10-22 LAB — CBC WITH DIFFERENTIAL/PLATELET
BASO%: 0.8 % (ref 0.0–2.0)
Basophils Absolute: 0.1 10*3/uL (ref 0.0–0.1)
EOS%: 1.6 % (ref 0.0–7.0)
Eosinophils Absolute: 0.2 10*3/uL (ref 0.0–0.5)
HCT: 39.9 % (ref 34.8–46.6)
HGB: 12.7 g/dL (ref 11.6–15.9)
LYMPH%: 20.8 % (ref 14.0–49.7)
MCH: 28.1 pg (ref 25.1–34.0)
MCHC: 31.9 g/dL (ref 31.5–36.0)
MCV: 88.2 fL (ref 79.5–101.0)
MONO#: 0.4 10*3/uL (ref 0.1–0.9)
MONO%: 4.9 % (ref 0.0–14.0)
NEUT#: 6.6 10*3/uL — ABNORMAL HIGH (ref 1.5–6.5)
NEUT%: 71.9 % (ref 38.4–76.8)
Platelets: 187 10*3/uL (ref 145–400)
RBC: 4.53 10*6/uL (ref 3.70–5.45)
RDW: 14.6 % — ABNORMAL HIGH (ref 11.2–14.5)
WBC: 9.2 10*3/uL (ref 3.9–10.3)
lymph#: 1.9 10*3/uL (ref 0.9–3.3)

## 2015-10-22 MED ORDER — IOHEXOL 300 MG/ML  SOLN
75.0000 mL | Freq: Once | INTRAMUSCULAR | Status: AC | PRN
  Administered 2015-10-22: 75 mL via INTRAVENOUS

## 2015-10-22 NOTE — Telephone Encounter (Signed)
1.  Do you need a wheel chair?  no  2. On oxygen? NO  3. Have you ever had any surgery in the body part being scanned?  YES BRAIN TUMOR REMOVED  4. Have you ever had any surgery on your brain or heart?      NO  TO HEART              5. Have you ever had surgery on your eyes or ears?  NO                             6. Do you have a pacemaker or defibrillator?  NO  7. Do you have a Neurostimulator?  NO  8. Claustrophobic?  NO  9. Any risk for metal in eyes? NO  10. Injury by bullet, buckshot, or shrapnel?  NO  11. Stent?  NO                                                                                                                12. Hx of Cancer? LUNG TO BRAIN  13. Kidney or Liver disease?    14. Hx of Lupus, Rheumatoid Arthritis or Scleroderma? NO  15. IV Antibiotics or long term use of NSAIDS?     15. HX of Hypertension?  NO  17. Diabetes?  NO  18. Allergy to contrast?  NO  / IS A HARD STICK FOR IV  19. Recent labs.

## 2015-10-29 ENCOUNTER — Ambulatory Visit (HOSPITAL_BASED_OUTPATIENT_CLINIC_OR_DEPARTMENT_OTHER): Payer: Medicare Other | Admitting: Internal Medicine

## 2015-10-29 ENCOUNTER — Telehealth: Payer: Self-pay | Admitting: Internal Medicine

## 2015-10-29 ENCOUNTER — Encounter: Payer: Self-pay | Admitting: Internal Medicine

## 2015-10-29 VITALS — BP 126/52 | HR 99 | Temp 98.1°F | Resp 17 | Ht 61.0 in | Wt 128.5 lb

## 2015-10-29 DIAGNOSIS — Z86711 Personal history of pulmonary embolism: Secondary | ICD-10-CM | POA: Diagnosis not present

## 2015-10-29 DIAGNOSIS — C3432 Malignant neoplasm of lower lobe, left bronchus or lung: Secondary | ICD-10-CM

## 2015-10-29 DIAGNOSIS — C3490 Malignant neoplasm of unspecified part of unspecified bronchus or lung: Secondary | ICD-10-CM | POA: Diagnosis not present

## 2015-10-29 DIAGNOSIS — Z7901 Long term (current) use of anticoagulants: Secondary | ICD-10-CM | POA: Diagnosis not present

## 2015-10-29 DIAGNOSIS — C7931 Secondary malignant neoplasm of brain: Secondary | ICD-10-CM

## 2015-10-29 NOTE — Telephone Encounter (Signed)
Gave pt appts for July 2017 and advised c-sched will call to schedule ct scan.

## 2015-10-29 NOTE — Progress Notes (Signed)
[facility] [phone]   Fax:([phone]) Harvey, MD [place] [address]  DIAGNOSIS: Metastatic non-small cell lung cancer initially diagnosed as Unresectable Stage IIB (T3, N0, M0) non-small cell lung cancer, poorly differentiated squamous cell carcinoma diagnosed in June of 2014.  The patient was diagnosed with metastatic lesion to the brain in January 2016.  PRIOR THERAPY:  1) Concurrent chemoradiation with weekly carboplatin for AUC of 2 and paclitaxel 45 mg/M2. Status post 5 cycles. First cycle was given on 04/09/2013. Last dose was given on 05/21/2013 with partial response.  2) Consolidation chemotherapy with carboplatin for AUC of 4 and paclitaxel 150 mg/M2 every 3 weeks with Neulasta support. First dose given on 07/31/2013. Status post 3 cycles with stable disease. 3) status post a stereotactic radiotherapy under the care of Dr. Tammi Klippel on 11/06/2014. 4) status post craniotomy with tumor resection under the care of Dr. Vertell Limber on 11/08/2014.  CURRENT THERAPY:  1) Observation. 2) Xarelto 20 mg by mouth daily for history of pulmonary embolism.  CHEMOTHERAPY INTENT: Control/curative  CURRENT # OF CHEMOTHERAPY CYCLES: 0 CURRENT ANTIEMETICS: Zofran, dexamethasone and Compazine  CURRENT SMOKING STATUS: Former smoker  ORAL CHEMOTHERAPY AND CONSENT: None  CURRENT BISPHOSPHONATES USE: None  PAIN MANAGEMENT: 0/10  NARCOTICS INDUCED CONSTIPATION: None  LIVING WILL AND CODE STATUS: Full code   INTERVAL HISTORY: Chelsea Cruz 75 y.o. female returns to the clinic today for followup visit accompanied by her daughter. The patient is feeling fine today with no specific complaints. She denied having any significant chest pain, shortness of breath, cough or hemoptysis. She is still on Xarelto for history of pulmonary embolism and tolerating it well. She denied having any bleeding issues. The patient  denied having any significant weight loss or night sweats. She has no nausea or vomiting. No fever or chills. She had repeat CT scan of the chest performed recently and she is here for evaluation and discussion of her scan results. She is also scheduled for MRI of the brain on 11/24/2015 by Dr. Tammi Klippel.  MEDICAL HISTORY: Past Medical History  Diagnosis Date  . Nodule of left lung   . Hypertension   . Nicotine addiction   . Osteopenia   . Osteomalacia   . Impaired fasting glucose   . Hypercholesteremia   . Depression   . COPD (chronic obstructive pulmonary disease) ([facility])   . GERD (gastroesophageal reflux disease)   . Lung cancer ([facility]) dx'd 02/2013  . 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 DNR no code (do not resuscitate) 07/23/2015    ALLERGIES:  is allergic to penicillins.  MEDICATIONS:  Current Outpatient Prescriptions  Medication Sig Dispense Refill  . acetaminophen (TYLENOL) 325 MG tablet Take 650 mg by mouth every 6 (six) hours as needed (for pain.).    Marland Kitchen Biotin 10 MG CAPS Take 1 each by mouth daily.    . cholecalciferol (VITAMIN D) 400 UNITS TABS tablet Take 400 Units by mouth.    Alveda Reasons 20 MG TABS tablet   1   No current facility-administered medications for this visit.    SURGICAL HISTORY:  Past Surgical History  Procedure Laterality Date  . Cholecystectomy    . Right oophorectomy      1969  . Cataract extraction w/ intraocular lens  implant, bilateral    . Bilateral carpal tunnel release    .  Appendectomy    . Craniotomy N/A 11/08/2014    Procedure: CRANIOTOMY TUMOR EXCISION;  Surgeon: Erline Levine, MD;  Location: [facility];  Service: Neurosurgery;  Laterality: N/A;  CRANIOTOMY TUMOR EXCISION  . Brain surgery      tumor removal 11/08/2014    REVIEW OF SYSTEMS:  A comprehensive review of systems was negative except for: Respiratory: positive for dyspnea on exertion   PHYSICAL EXAMINATION: General appearance:  alert, cooperative and no distress Head: Normocephalic, without obvious abnormality, atraumatic Neck: no adenopathy, no JVD, supple, symmetrical, trachea midline and thyroid not enlarged, symmetric, no tenderness/mass/nodules Lymph nodes: Cervical, supraclavicular, and axillary nodes normal. Resp: clear to auscultation bilaterally Back: symmetric, no curvature. ROM normal. No CVA tenderness. Cardio: regular rate and rhythm, S1, S2 normal, no murmur, click, rub or gallop GI: soft, non-tender; bowel sounds normal; no masses,  no organomegaly Extremities: extremities normal, atraumatic, no cyanosis or edema Neurologic: Alert and oriented X 3, normal strength and tone. Normal symmetric reflexes. Normal coordination and gait  ECOG PERFORMANCE STATUS: 0 - Asymptomatic  Blood pressure 126/52, pulse 99, temperature 98.1 F (36.7 C), temperature source Oral, resp. rate 17, height '5\' 1"'$  (1.549 m), weight 128 lb 8 oz (58.287 kg), SpO2 97 %.  LABORATORY DATA: Lab Results  Component Value Date   WBC 9.2 10/22/2015   HGB 12.7 10/22/2015   HCT 39.9 10/22/2015   MCV 88.2 10/22/2015   PLT 187 10/22/2015      Chemistry      Component Value Date/Time   NA 143 10/22/2015 1537   NA 136 12/28/2014 0602   K 4.0 10/22/2015 1537   K 3.3* 12/28/2014 0602   CL 99 12/28/2014 0602   CL 98 04/09/2013 1114   CO2 27 10/22/2015 1537   CO2 31 12/28/2014 0602   BUN 17.6 10/22/2015 1537   BUN 11 12/28/2014 0602   CREATININE 0.9 10/22/2015 1537   CREATININE 0.49* 12/28/2014 0602      Component Value Date/Time   CALCIUM 9.7 10/22/2015 1537   CALCIUM 8.3* 12/28/2014 0602   ALKPHOS 85 10/22/2015 1537   ALKPHOS 40 12/28/2014 0602   AST 20 10/22/2015 1537   AST 23 12/28/2014 0602   ALT 25 10/22/2015 1537   ALT 40* 12/28/2014 0602   BILITOT 0.93 10/22/2015 1537   BILITOT 1.0 12/28/2014 0602       RADIOGRAPHIC STUDIES: Ct Chest W Contrast  10/22/2015  CLINICAL DATA:  75 year old female with history of  left lower lobe lung cancer. Restaging examination. Brain metastasis. EXAM: CT CHEST WITH CONTRAST TECHNIQUE: Multidetector CT imaging of the chest was performed during intravenous contrast administration. CONTRAST:  42m OMNIPAQUE IOHEXOL 300 MG/ML  SOLN COMPARISON:  Chest CT 07/17/2015. FINDINGS: Mediastinum/Lymph Nodes: Heart size is normal. Small amount of pericardial fluid overlying the left ventricle, similar to the prior study, likely a chronic partially loculated pericardial effusion. This is unlikely to be of any hemodynamic significance at this time. No associated pericardial calcification. There is atherosclerosis of the thoracic aorta, the great vessels of the mediastinum and the coronary arteries, including calcified atherosclerotic plaque in the right coronary arteries. No pathologically enlarged mediastinal lymph nodes. Persistent prominence of left hilar soft tissue, measuring up to 8 mm in short axis, slightly decreased compared to the prior study. No right hilar lymphadenopathy. Esophagus is unremarkable in appearance. No axillary lymphadenopathy. Lungs/Pleura: Extensive architectural distortion again noted throughout the perihilar aspect of the left lung, most evident in the left lower lobe, compatible with  an area of chronic postradiation masslike fibrosis. Small chronic partially loculated left pleural effusion is similar to the prior examination. Previously noted ground-glass attenuation nodule in the right upper lobe has resolved, indicative of an infectious or inflammatory nodule on the prior study. There are otherwise no new suspicious appearing pulmonary nodules or masses identified. Mild diffuse bronchial wall thickening with mild centrilobular and paraseptal emphysema. Upper Abdomen: Mild nodular thickening of the left adrenal gland is unchanged compared to prior studies, presumably some mild hyperplasia. 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 There are no aggressive appearing lytic or blastic lesions noted in the visualized portions of the skeleton. IMPRESSION: 1. Stable post treatment related changes in the left lung appears similar to prior studies. No definite findings to suggest local recurrence of disease or metastatic disease in the thorax on today's examination. 2. Previously noted ground-glass attenuation nodule in the right upper lobe has resolved, indicative of a benign finding on the prior study. 3. Additional incidental findings, similar prior studies, as above. 4. Electronically Signed   By: Vinnie Langton M.D.   On: 10/22/2015 17:41   ASSESSMENT AND PLAN:  She is a very pleasant 75 years old white female with metastatic non-small cell lung cancer initially diagnosed as unresectable stage IIB non-small cell lung cancer. She status post concurrent chemoradiation and recently completed 3 cycles of consolidation chemotherapy with carboplatin and paclitaxel. She was recently found to have metastatic brain lesion in January 2016 and the patient underwent stereotactic radiotherapy followed by surgical resection of the brain tumor.  The recent CT scan of the chest showed no evidence for disease progression and the previously noted groundglass attenuation nodule in the right upper lobe has resolved. I discussed the scan results with the patient and her daughter. I recommended for her to continue on observation with repeat CT scan of the chest in 6 months. CODE STATUS was discussed with the patient and she indicated no CODE BLUE. For the history of pulmonary embolism, the patient will continue on Xarelto 20 mg by mouth daily. She was advised to call immediately if she has any concerning symptoms in the interval.  The patient voices understanding of current disease  status and treatment options and is in agreement with the current care plan.  All questions were answered. The patient knows to call the clinic with any problems, questions or concerns. We can certainly see the patient much sooner if necessary.  Disclaimer: This note was dictated with voice recognition software. Similar sounding words can inadvertently be transcribed and may not be corrected upon review.   Eilleen Kempf., MD 10/29/2015

## 2015-11-24 ENCOUNTER — Other Ambulatory Visit: Payer: Medicare Other

## 2015-12-05 ENCOUNTER — Ambulatory Visit
Admission: RE | Admit: 2015-12-05 | Discharge: 2015-12-05 | Disposition: A | Discharge status: Home / Self Care | Payer: Medicare Other | Source: Ambulatory Visit | Attending: Radiation Oncology | Admitting: Radiation Oncology

## 2015-12-05 DIAGNOSIS — C7931 Secondary malignant neoplasm of brain: Secondary | ICD-10-CM

## 2015-12-05 DIAGNOSIS — C7949 Secondary malignant neoplasm of other parts of nervous system: Principal | ICD-10-CM

## 2015-12-05 MED ORDER — GADOBENATE DIMEGLUMINE 529 MG/ML IV SOLN
12.0000 mL | Freq: Once | INTRAVENOUS | Status: AC | PRN
  Administered 2015-12-05: 12 mL via INTRAVENOUS

## 2015-12-08 ENCOUNTER — Encounter: Payer: Self-pay | Admitting: Radiation Oncology

## 2015-12-08 ENCOUNTER — Ambulatory Visit
Admission: RE | Admit: 2015-12-08 | Discharge: 2015-12-08 | Disposition: A | Discharge status: Home / Self Care | Payer: Medicare Other | Source: Ambulatory Visit | Attending: Radiation Oncology | Admitting: Radiation Oncology

## 2015-12-08 VITALS — BP 136/58 | HR 99 | Temp 98.6°F | Resp 20 | Ht 61.0 in | Wt 128.8 lb

## 2015-12-08 DIAGNOSIS — C7931 Secondary malignant neoplasm of brain: Secondary | ICD-10-CM

## 2015-12-08 NOTE — Progress Notes (Signed)
Follow up  S/p SRS Brain , had MRI brain 11/04/15 here  for results, no pain, no nausea,  No light headed,  Appetite godd, tasts has changed, energy level  Is better  10:52 AM BP 136/58 mmHg  Pulse 99  Temp(Src) 98.6 F (37 C) (Oral)  Resp 20  Ht '5\' 1"'$  (1.549 m)  Wt 128 lb 12.8 oz (58.423 kg)  BMI 24.35 kg/m2  SpO2 97%  Wt Readings from Last 3 Encounters:  12/08/15 128 lb 12.8 oz (58.423 kg)  10/29/15 128 lb 8 oz (58.287 kg)  09/01/15 129 lb 8 oz (58.741 kg)

## 2015-12-09 NOTE — Progress Notes (Signed)
Radiation Oncology         [phone] ________________________________  Name: Chelsea Cruz  MRN: [id number]  Date: 12/08/2015  DOB: 06-06-1941    Follow-Up Visit Note  CC: MCNEILL,[name], MD  Cari Caraway, MD  Diagnosis:   75 year old woman with a solitary 3.4 cm left frontal brain metastasis from squamous cell carcinoma of the left upper lung:  1. The vaginal radiotherapy to the left lung with 66 Gy in 33 fractions between 04/09/2013 and 05/24/2013 2. s/p pre-op SRS 11/06/2014 to 15 Gy on 11/06/14    ICD-9-CM ICD-10-CM   1. Metastasis to brain (HCC) 198.3 C79.31      Interval Since Last Radiation:  13 months  Narrative:  The patient comes today for follow-up of her most recent MRI of the brain. Of note she was seen in January by Dr. Julien Nordmann after undergoing a repeat CT scan of the chest on 10/22/15 which revealed complete resolution of a groundglass opacity that had been previously seen on her CT scan in September 2016. Persistent left hilar soft tissue measuring 8 mm was slightly decreased in comparison to her previous study. A small left loculated effusion was similar to previous examination, and postradiation masslike fibrosis was noted in the left lung consistent with her previous treatment.  On review of systems the patient reports that she is doing extremely well. She is anxious about her appointment today. She denies any headaches, blurred vision, tinnitus, dizziness, visual or auditory acuity changes. She denies any nausea or vomiting. She has been doing well from her lung cancer perspective, and states that she will see Dr. Julien Nordmann in 6 months time. She is very pleased with this. A complete review of systems is obtained and is otherwise negative.  ALLERGIES:  is allergic to penicillins.  Meds: Current Outpatient Prescriptions  Medication Sig Dispense Refill  . acetaminophen (TYLENOL) 325 MG tablet Take 650 mg by mouth every 6 (six) hours as needed (for pain.).    Marland Kitchen  Biotin 10 MG CAPS Take 1 each by mouth daily.    . cholecalciferol (VITAMIN D) 400 UNITS TABS tablet Take 400 Units by mouth.    Alveda Reasons 20 MG TABS tablet   1   No current facility-administered medications for this encounter.    Physical Findings:  height is '5\' 1"'$  (1.549 m) and weight is 128 lb 12.8 oz (58.423 kg). Her oral temperature is 98.6 F (37 C). Her blood pressure is 136/58 and her pulse is 99. Her respiration is 20 and oxygen saturation is 97%.   Pain scale 0/10 In general this is a well-appearing Caucasian female in no acute distress. She is alert and oriented 4 and appropriate throughout the examination. She is normocephalic, atraumatic, EOMs are intact. Her incision site from previous craniotomy is well-healed. Her hair is growing back nicely. Cardiopulmonary assessment does not reveal any acute distress, and exhibits normal effort.   Lab Findings: Lab Results  Component Value Date   WBC 9.2 10/22/2015   WBC 4.8 12/29/2014   HGB 12.7 10/22/2015   HGB 9.9* 12/29/2014   HCT 39.9 10/22/2015   HCT 30.6* 12/29/2014   PLT 187 10/22/2015   PLT 201 12/29/2014    Lab Results  Component Value Date   NA 143 10/22/2015   NA 136 12/28/2014   K 4.0 10/22/2015   K 3.3* 12/28/2014   CHLORIDE 106 10/22/2015   CO2 27 10/22/2015   CO2 31 12/28/2014   GLUCOSE 113 10/22/2015   GLUCOSE 99  12/28/2014   GLUCOSE 102* 04/09/2013   BUN 17.6 10/22/2015   BUN 11 12/28/2014   CREATININE 0.9 10/22/2015   CREATININE 0.49* 12/28/2014   BILITOT 0.93 10/22/2015   BILITOT 1.0 12/28/2014   ALKPHOS 85 10/22/2015   ALKPHOS 40 12/28/2014   AST 20 10/22/2015   AST 23 12/28/2014   ALT 25 10/22/2015   ALT 40* 12/28/2014   PROT 7.2 10/22/2015   PROT 5.0* 12/28/2014   ALBUMIN 3.9 10/22/2015   ALBUMIN 2.2* 12/28/2014   CALCIUM 9.7 10/22/2015   CALCIUM 8.3* 12/28/2014   ANIONGAP 10 10/22/2015   ANIONGAP 6 12/28/2014    Radiographic Findings: Mr [name] Wo Contrast  12/05/2015   CLINICAL DATA:  Metastatic non-small-cell lung cancer status post stereotactic radiosurgery and tumor resection 10/2014. Creatinine was obtained on site at [facility] at [address] Results: Creatinine 0.9 mg/dL. EXAM: MRI HEAD WITHOUT AND WITH CONTRAST TECHNIQUE: Multiplanar, multiecho pulse sequences of the brain and surrounding structures were obtained without and with intravenous contrast. CONTRAST:  29m MULTIHANCE GADOBENATE DIMEGLUMINE 529 MG/ML IV SOLN COMPARISON:  08/28/2015 FINDINGS: There is no acute infarct or extra-axial fluid collection. Small foci of T2 hyperintensity scattered throughout the cerebral white matter bilaterally are unchanged and nonspecific but compatible with mild chronic small vessel ischemic disease. Sequelae of left frontotemporal craniotomy are again identified. An underlying left frontal lobe resection cavity is again seen containing chronic blood products. Mild nonenhancing T2 hyperintensity surrounding the cavity is unchanged. Dural thickening and enhancement subjacent to the craniotomy are unchanged and likely postoperative. Linear and mildly nodular enhancement in the resection cavity is unchanged, with much of the signal within the cavity reflecting intrinsic T1 hyperintensity rather than enhancement. Ex vacuo dilatation of the left lateral ventricle is unchanged. A small developmental venous anomaly is again noted in the medial left frontal lobe. No new foci of abnormal enhancement are identified. There is mild global cerebral atrophy. Prior bilateral cataract extraction is noted. There is new, complete opacification of the right maxillary sinus. Mild right ethmoid air cell mucosal thickening is also noted. A left mastoid effusion is unchanged. Major intracranial vascular flow voids are preserved. IMPRESSION: 1. Stable post treatment appearance of the left frontal lobe. 2. No evidence of new intracranial metastases. Electronically Signed   By: ALogan Bores M.D.   On: 12/05/2015 09:55    Impression:  75year old female status post SRS to the brain for metastatic non-small cell, squamous cell carcinoma of the left lung  Plan:  The patient appears to be doing very well overall. We will follow-up with her in 3 months time after she undergoes repeat MRI of the brain. She will keep uKoreainformed any questions or concerns that arise prior to her next visit.  ACarola Rhine PAC

## 2015-12-09 NOTE — Patient Instructions (Signed)
Contact our office if you have any questions following today's appointment: [phone].  

## 2016-01-16 IMAGING — CT CT CHEST W/ CM
2 of 3 series · 15 of 36 positions shown, 18 images · IV contrast (OMNIPAQUE)
Comparison: 01/16/2014.

CLINICAL DATA: Lung cancer. Status post chemotherapy and radiation
therapy.

EXAM:
CT CHEST WITH CONTRAST
TECHNIQUE: Multidetector CT imaging of the chest was performed during
intravenous contrast administration.
CONTRAST:  80mL OMNIPAQUE IOHEXOL 300 MG/ML  SOLN

[Series 2: chest with st · axial · 0.74mm/px · z∈[-223,+32]mm · 12 of 61 slices shown, 15 images]
[im 5/61  mediastinal]
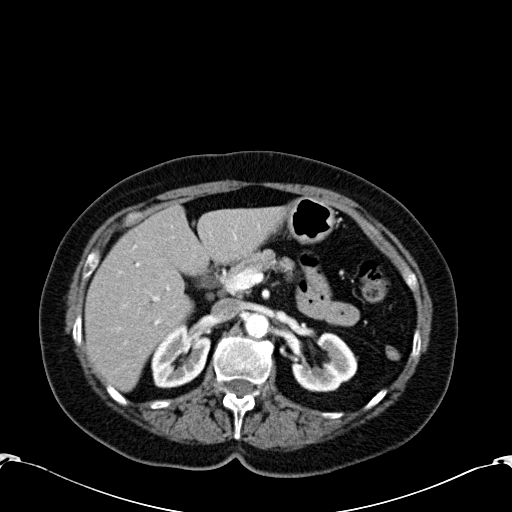
[im 5/61  lung]
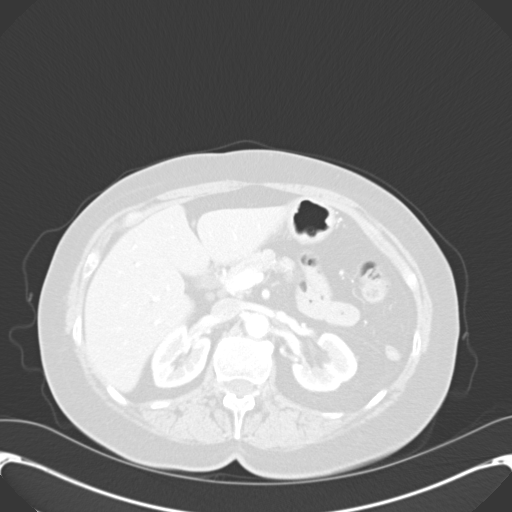
[im 9/61  lung]
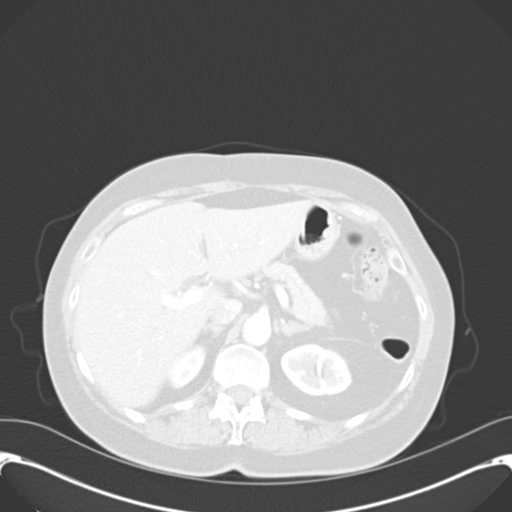
[im 14/61  lung]
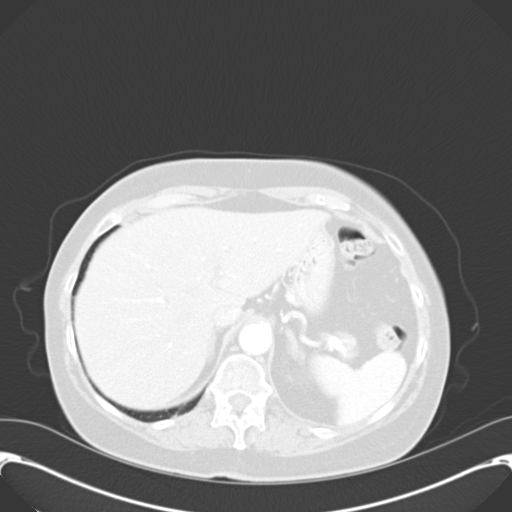
[im 18/61  lung]
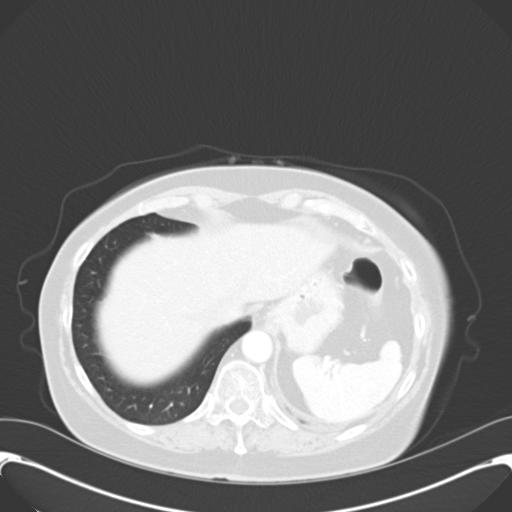
[im 23/61  mediastinal]
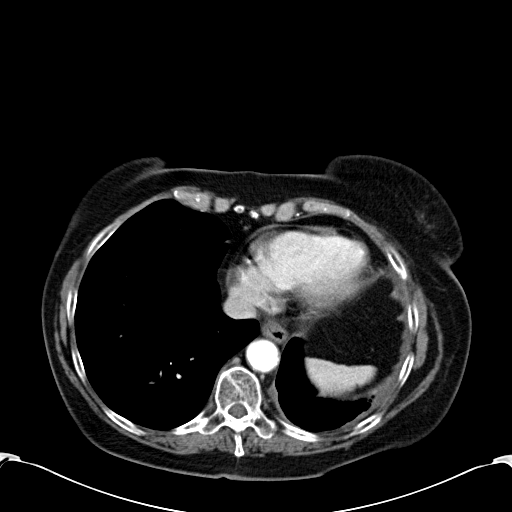
[im 23/61  lung]
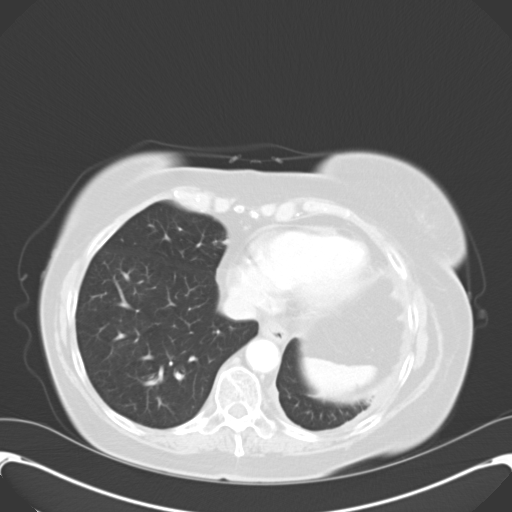
[im 27/61  lung]
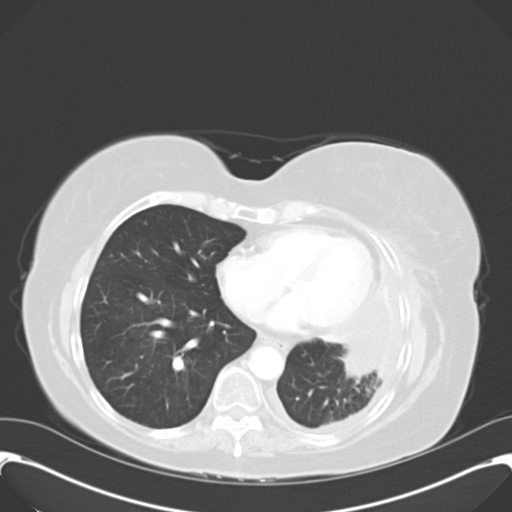
[im 34/61  lung]
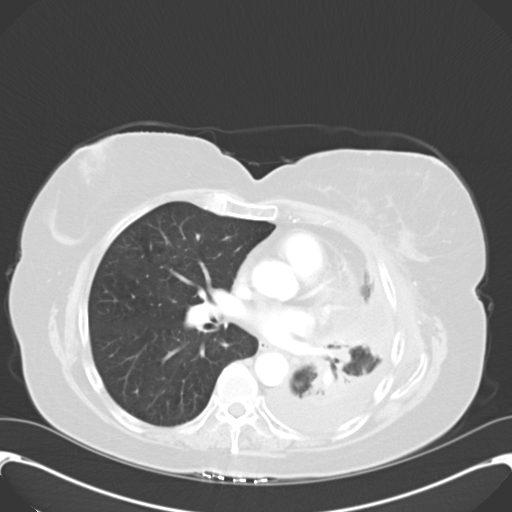
[im 38/61  lung]
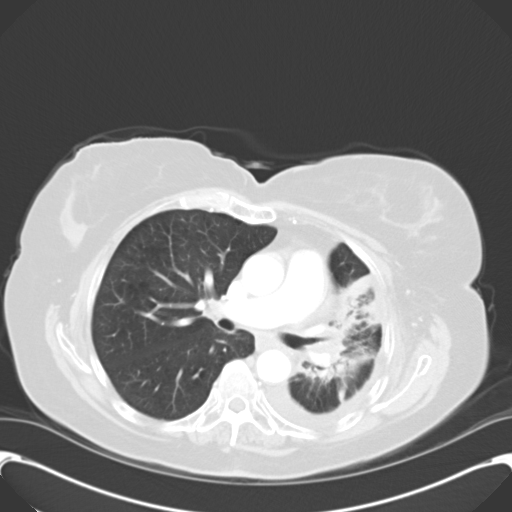
[im 43/61  mediastinal]
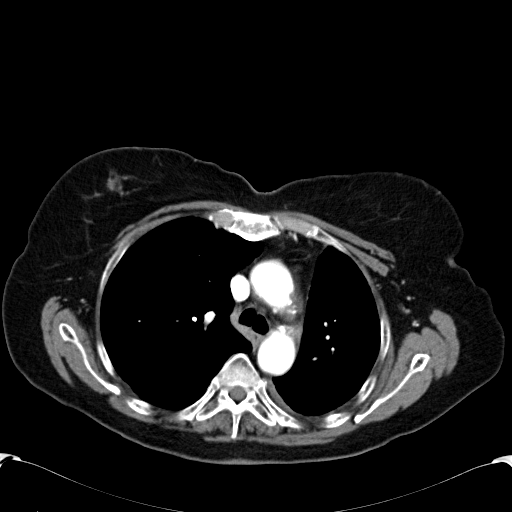
[im 43/61  lung]
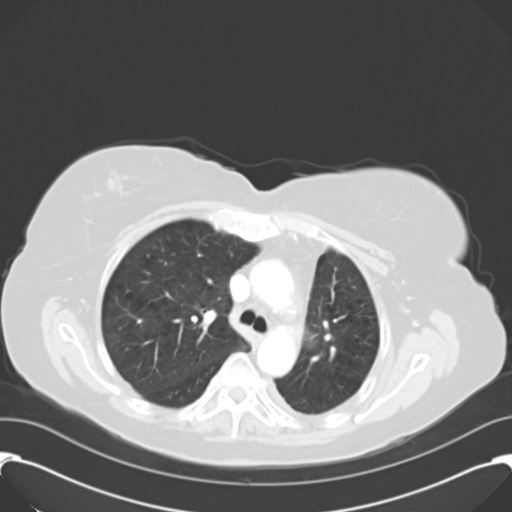
[im 47/61  lung]
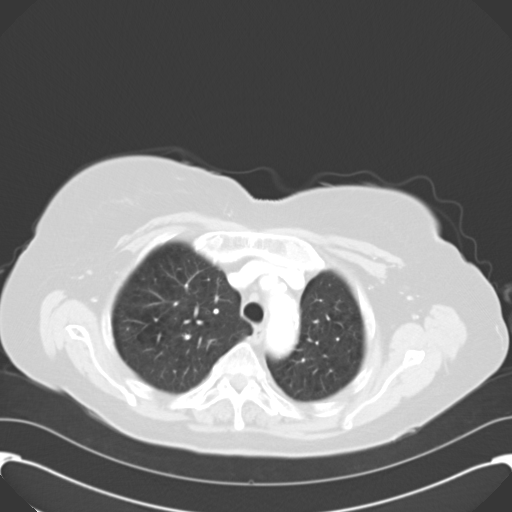
[im 52/61  lung]
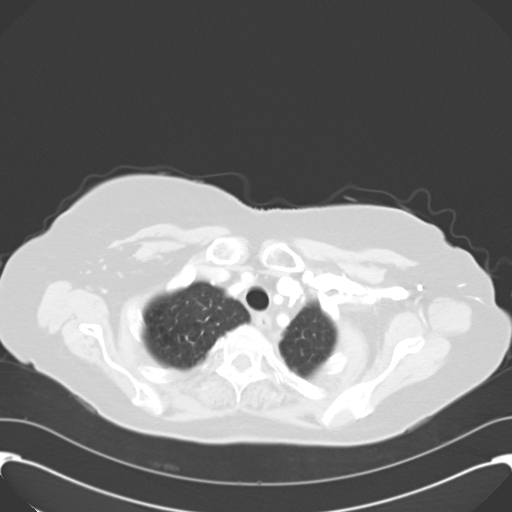
[im 56/61  lung]
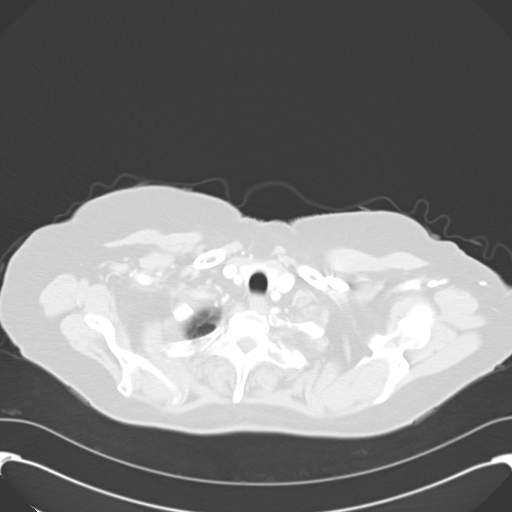

[Series 602: coronal · coronal · 0.74mm/px · 3 of 71 slices shown]
[im 15/71  lung]
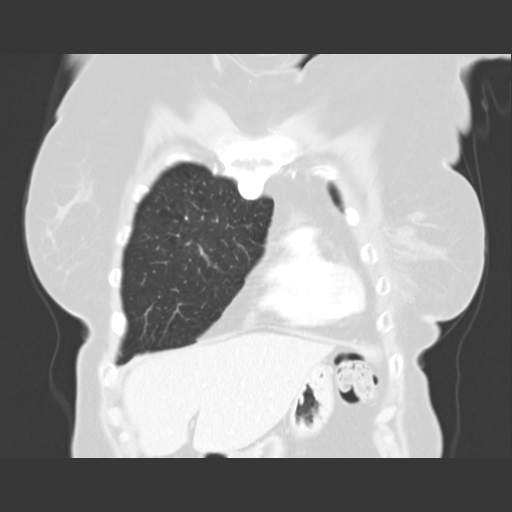
[im 29/71  lung]
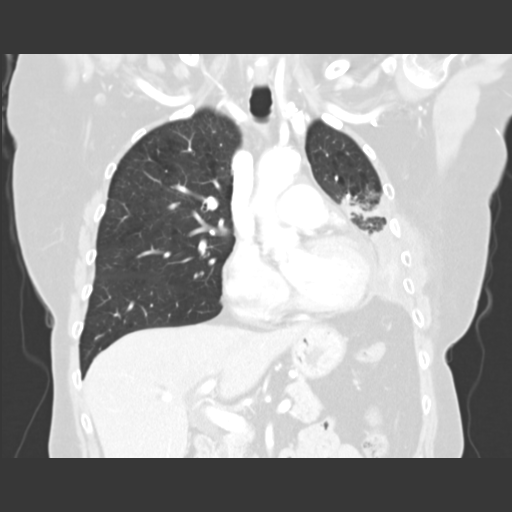
[im 43/71  lung]
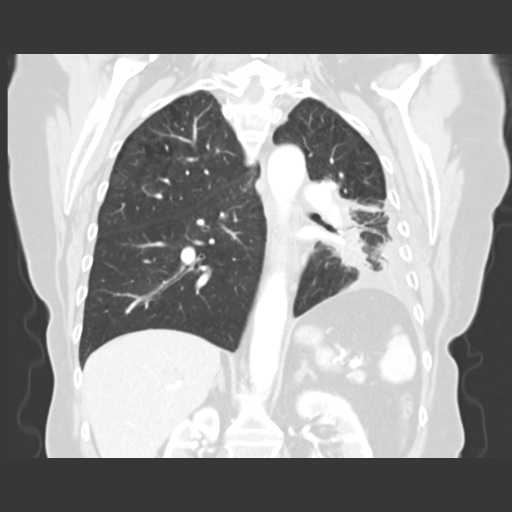

[15 of 36 positions shown; findings below may reference images not displayed]

FINDINGS: Examination of the chest wall demonstrates fairly significant soft
tissue thickening involving the lower left chest wall. I think this
is most likely due to radiation effect. There is also thickening of
the cyst radius anterior muscle and mild surrounding edema. This has
not significantly changed since the CT of 01/16/2014. I do not see a
discrete mass or destructive bony changes. Continued surveillance is
recommended.

No breast masses, supraclavicular or axillary adenopathy. The
thyroid gland is normal. The bony thorax is intact.

The heart is normal in size. No pericardial effusion. No mediastinal
or hilar mass or adenopathy. Small scattered lymph nodes are stable.
The aorta is normal in caliber. Stable atherosclerotic
calcifications. The esophagus is grossly normal.

Examination of the lung parenchyma demonstrates stable emphysematous
changes. Fairly extensive radiation changes involving the left lower
lung with some residual matted soft tissue density in the left hilar
and infrahilar regions which is likely treated tumor. A chronic left
pleural effusion is again demonstrated. No enhancing pleural
nodules.

The right lung is clear.  No worrisome pulmonary nodules.

The upper abdomen is unremarkable and stable. Stable intra and
extrahepatic biliary dilatation likely due to prior cholecystectomy.
Stable nodularity of both adrenal glands.
IMPRESSION: 1. Extensive radiation changes involving the left hemi thorax as
discussed above. Residual matted soft tissue density in the left
hilar and infrahilar regions is likely treated tumor. No CT findings
worrisome for recurrent or progressive tumor and no pulmonary
metastatic lesions.
2. Left-sided chest wall thickening and edema is likely radiation
effect.
3. Stable emphysematous changes.
4. Stable intra and extrahepatic biliary dilatation and stable
nodularity of both adrenal glands.

## 2016-02-26 ENCOUNTER — Other Ambulatory Visit: Payer: Self-pay | Admitting: Radiation Therapy

## 2016-02-26 DIAGNOSIS — C7931 Secondary malignant neoplasm of brain: Secondary | ICD-10-CM

## 2016-02-26 DIAGNOSIS — C7949 Secondary malignant neoplasm of other parts of nervous system: Principal | ICD-10-CM

## 2016-03-12 ENCOUNTER — Ambulatory Visit
Admission: RE | Admit: 2016-03-12 | Discharge: 2016-03-12 | Disposition: A | Discharge status: Home / Self Care | Payer: Medicare Other | Source: Ambulatory Visit | Attending: Radiation Oncology | Admitting: Radiation Oncology

## 2016-03-12 DIAGNOSIS — C7931 Secondary malignant neoplasm of brain: Secondary | ICD-10-CM

## 2016-03-12 DIAGNOSIS — C7949 Secondary malignant neoplasm of other parts of nervous system: Principal | ICD-10-CM

## 2016-03-12 MED ORDER — GADOBENATE DIMEGLUMINE 529 MG/ML IV SOLN
11.0000 mL | Freq: Once | INTRAVENOUS | Status: AC | PRN
  Administered 2016-03-12: 11 mL via INTRAVENOUS

## 2016-03-16 ENCOUNTER — Other Ambulatory Visit: Payer: Medicare Other

## 2016-03-17 ENCOUNTER — Encounter: Payer: Self-pay | Admitting: Radiation Oncology

## 2016-03-17 ENCOUNTER — Ambulatory Visit
Admission: RE | Admit: 2016-03-17 | Discharge: 2016-03-17 | Disposition: A | Discharge status: Home / Self Care | Payer: Medicare Other | Source: Ambulatory Visit | Attending: Radiation Oncology | Admitting: Radiation Oncology

## 2016-03-17 VITALS — BP 131/63 | HR 87 | Temp 98.0°F | Ht 61.0 in | Wt 129.0 lb

## 2016-03-17 DIAGNOSIS — Z66 Do not resuscitate: Secondary | ICD-10-CM | POA: Insufficient documentation

## 2016-03-17 DIAGNOSIS — C3432 Malignant neoplasm of lower lobe, left bronchus or lung: Secondary | ICD-10-CM

## 2016-03-17 DIAGNOSIS — C7931 Secondary malignant neoplasm of brain: Secondary | ICD-10-CM | POA: Diagnosis not present

## 2016-03-17 DIAGNOSIS — Z87891 Personal history of nicotine dependence: Secondary | ICD-10-CM | POA: Diagnosis not present

## 2016-03-17 DIAGNOSIS — J449 Chronic obstructive pulmonary disease, unspecified: Secondary | ICD-10-CM | POA: Diagnosis not present

## 2016-03-17 DIAGNOSIS — K219 Gastro-esophageal reflux disease without esophagitis: Secondary | ICD-10-CM | POA: Insufficient documentation

## 2016-03-17 DIAGNOSIS — I10 Essential (primary) hypertension: Secondary | ICD-10-CM | POA: Diagnosis not present

## 2016-03-17 DIAGNOSIS — M8580 Other specified disorders of bone density and structure, unspecified site: Secondary | ICD-10-CM | POA: Insufficient documentation

## 2016-03-17 DIAGNOSIS — F329 Major depressive disorder, single episode, unspecified: Secondary | ICD-10-CM | POA: Insufficient documentation

## 2016-03-17 DIAGNOSIS — C3412 Malignant neoplasm of upper lobe, left bronchus or lung: Secondary | ICD-10-CM | POA: Diagnosis present

## 2016-03-17 DIAGNOSIS — E7800 Pure hypercholesterolemia, unspecified: Secondary | ICD-10-CM | POA: Diagnosis not present

## 2016-03-17 NOTE — Progress Notes (Signed)
Chelsea Cruz here for reassessmetn s/p XRT to the Left frontal Brain and information regarding results of MR of Brain completed on 03/12/16.   She reports that "one in a blue moon, I stumble over my feet."  No changes in fine motor movement.  Denies any blurred vision, nor headaches.  Speech clear. Note intermittent fatigue.  Maintaining weight eventhough her "food taste funny". Eating mild flavered foods.  BP 131/63 mmHg  Pulse 87  Temp(Src) 98 F (36.7 C)  Ht '5\' 1"'$  (1.549 m)  Wt 129 lb (58.514 kg)  BMI 24.39 kg/m2   Wt Readings from Last 3 Encounters:  03/17/16 129 lb (58.514 kg)  12/08/15 128 lb 12.8 oz (58.423 kg)  10/29/15 128 lb 8 oz (58.287 kg)

## 2016-03-18 NOTE — Progress Notes (Addendum)
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 Metastasis to brain (HCC) 198.3 C79.31   2. Primary cancer of left lower lobe of lung (HCC) 162.5 C34.32      Interval Since Last Radiation:  16 months  Narrative:  The patient comes today for follow-up of her most recent MRI of the brain. Again she is followed by Dr. Julien Nordmann and continues under surveillance. She is due to see him on April 29, 2016. She did undergo an MRI of the brain on 03/12/16 revealing stable findings without concern for disease.    On review of systems the patient reports that she is doing extremely well. She is not experiencing any headaches, blurred vision, tinnitus, dizziness, other visual or auditory acuity changes. She denies any nausea or vomiting. She denies any chest pain, shortness of breath, cough, fevers, chills, night sweats, unintended weight changes, bowel or bladder disturbances, and denies abdominal pain. She denies any new musculoskeletal or joint aches or pains. A complete review of systems is obtained and is otherwise negative.  Past Medical History:  Past Medical History  Diagnosis Date  . Nodule of left lung   . Hypertension   . Nicotine addiction   . Osteopenia   . Osteomalacia   . Impaired fasting glucose   . Hypercholesteremia   . Depression   . COPD (chronic obstructive pulmonary disease) ([place])   . GERD (gastroesophageal reflux disease)   . Lung cancer ([place]) dx'd 02/2013  . Brain  cancer ([place])     squamous cell carcinoma of lung with a solitary brain met  . DNR no code (do not resuscitate) 07/23/2015    Past Surgical History: Past Surgical History  Procedure Laterality Date  . Cholecystectomy    . Right oophorectomy      1969  . Cataract extraction w/ intraocular lens  implant, bilateral    . Bilateral carpal tunnel release    . Appendectomy    . Craniotomy N/A 11/08/2014    Procedure: CRANIOTOMY TUMOR EXCISION;  Surgeon: Erline Levine, MD;  Location: [facility];  Service: Neurosurgery;  Laterality: N/A;  CRANIOTOMY TUMOR EXCISION  . Brain surgery      tumor removal 11/08/2014    Social History:  Social History   Social History  . Marital Status: Widowed    Spouse Name: N/A  . Number of Children: N/A  . Years of Education: N/A   Occupational History  . retired    Social History Main Topics  . Smoking status: Former Smoker -- 1.00 packs/day for 45 years    Types: Cigarettes    Quit date: 03/06/2013  . Smokeless tobacco: Never Used  . Alcohol Use: No  . Drug Use: No  . Sexual Activity: No   Other Topics Concern  . Not on file   Social History Narrative    Family History: Family History  Problem Relation Age of Onset  . Heart  disease Mother   . Coronary artery disease Brother   . Alzheimer's disease Brother   . Heart disease Brother   . Hypertension Brother     #2  . Stroke Brother     #2  . Brain cancer Brother     #3     ALLERGIES:  is allergic to penicillins.  Meds: Current Outpatient Prescriptions  Medication Sig Dispense Refill  . acetaminophen (TYLENOL) 325 MG tablet Take 650 mg by mouth every 6 (six) hours as needed (for pain.).    Marland Kitchen Biotin 10 MG CAPS Take 1 each by mouth daily.    . cholecalciferol (VITAMIN D) 400 UNITS TABS tablet Take 400 Units by mouth.    Alveda Reasons 20 MG TABS tablet   1   No current facility-administered medications for this encounter.    Physical Findings:  height is _0  (1.549 m) and  weight is 129 lb (58.514 kg). Her temperature is 98 F (36.7 C). Her blood pressure is 131/63 and her pulse is 87.   Pain scale 0/10 In general this is a well appearing caucasian female in no acute distress. She's alert and oriented x4 and appropriate throughout the examination. Cardiopulmonary assessment is negative for acute distress and she exhibits normal effort.  HEENT reveals that the patient is normocephalic, atraumatic. EOMs are intact. PERRLA. Skin is intact without any evidence of gross lesions. She appears to be neurologically intact grossly without focal abnormalities.    Lab Findings: Lab Results  Component Value Date   WBC 9.2 10/22/2015   WBC 4.8 12/29/2014   HGB 12.7 10/22/2015   HGB 9.9* 12/29/2014   HCT 39.9 10/22/2015   HCT 30.6* 12/29/2014   PLT 187 10/22/2015   PLT 201 12/29/2014    Lab Results  Component Value Date   NA 143 10/22/2015   NA 136 12/28/2014   K 4.0 10/22/2015   K 3.3* 12/28/2014   CHLORIDE 106 10/22/2015   CO2 27 10/22/2015   CO2 31 12/28/2014   GLUCOSE 113 10/22/2015   GLUCOSE 99 12/28/2014   GLUCOSE 102* 04/09/2013   BUN 17.6 10/22/2015   BUN 11 12/28/2014   CREATININE 0.9 10/22/2015   CREATININE 0.49* 12/28/2014   BILITOT 0.93 10/22/2015   BILITOT 1.0 12/28/2014   ALKPHOS 85 10/22/2015   ALKPHOS 40 12/28/2014   AST 20 10/22/2015   AST 23 12/28/2014   ALT 25 10/22/2015   ALT 40* 12/28/2014   PROT 7.2 10/22/2015   PROT 5.0* 12/28/2014   ALBUMIN 3.9 10/22/2015   ALBUMIN 2.2* 12/28/2014   CALCIUM 9.7 10/22/2015   CALCIUM 8.3* 12/28/2014   ANIONGAP 10 10/22/2015   ANIONGAP 6 12/28/2014    Radiographic Findings: Mr [name] Wo Contrast  03/12/2016  CLINICAL DATA:  Restaging brain metastases. Squamous cell lung cancer with left frontal metastasis treated with preoperative stereotactic radiotherapy (11/06/2014) and subsequent resection. EXAM: MRI HEAD WITHOUT AND WITH CONTRAST TECHNIQUE: Multiplanar, multiecho pulse sequences of  the brain and surrounding structures were obtained without and with intravenous contrast. CONTRAST:  108m MULTIHANCE GADOBENATE DIMEGLUMINE 529 MG/ML IV SOLN COMPARISON:  12/05/2015 FINDINGS: Calvarium and upper cervical spine: Unremarkable craniotomy site. Orbits: Bilateral cataract resection. Sinuses and Mastoids: Resolved right maxillary sinusitis but new right sphenoid sinusitis with moderate mucosal thickening and retained secretions. Chronic left mastoid effusion with clear nasopharynx. Brain: Post treatment changes to the posterior left frontal lobe with intrinsic T1 hyperintensity centrally in the collapsed resection cavity. Stable enhancement that is mainly along  the bone flap. Surrounding signal abnormality with volume loss. No new signal abnormality, mass effect, or enhancement to suggest residual or recurrent disease. No new metastasis is identified. No acute or interval infarct, hemorrhage, hydrocephalus, or mass lesion. IMPRESSION: 1. Stable post treatment appearance of the left frontal resection site. 2. No evidence of new metastasis. 3. Focal right sphenoid sinusitis. Electronically Signed   By: Monte Fantasia M.D.   On: 03/12/2016 15:56    Impression:  1.  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 The patient continues with Dr. Julien Nordmann under surveillance. Radiographically and clinically, she is doing well and is with stable findings on MRI. We will follow-up with her in 3 months time after she undergoes repeat MRI of the brain. She will keep [place] informed any questions or concerns that arise prior to her next visit.     Carola Rhine, PAC,

## 2016-03-18 NOTE — Addendum Note (Signed)
Encounter addended by: Benn Moulder, RN on: 03/18/2016 10:55 AM<BR>     Documentation filed: Charges VN

## 2016-04-07 ENCOUNTER — Other Ambulatory Visit: Payer: Self-pay | Admitting: *Deleted

## 2016-04-07 DIAGNOSIS — C7931 Secondary malignant neoplasm of brain: Secondary | ICD-10-CM

## 2016-04-07 DIAGNOSIS — C7949 Secondary malignant neoplasm of other parts of nervous system: Principal | ICD-10-CM

## 2016-04-08 ENCOUNTER — Telehealth: Payer: Self-pay | Admitting: *Deleted

## 2016-04-08 NOTE — Telephone Encounter (Signed)
1.  Do you need a wheel chair?  no  2. On oxygen? no  3. Have you ever had any surgery in the body part being scanned? yes brain tumor removed  4. Have you ever had any surgery on your brain or heart?     no to heart                 5. Have you ever had surgery on your eyes or ears?      no                        6. Do you have a pacemaker or defibrillator?  no  7. Do you have a Neurostimulator?  no  8. Claustrophobic?  no  9. Any risk for metal in eyes? no  10. Injury by bullet, buckshot, or shrapnel?  no  11. Stent?  no                                                                                                                 12. Hx of Cancer?   13. Kidney or Liver disease?  no  14. Hx of Lupus, Rheumatoid Arthritis or Scleroderma? no  15. IV Antibiotics or long term use of NSAIDS?     82. HX of Hypertension?  no  17. Diabetes?  no  18. Allergy to contrast?  no  19. Recent labs.

## 2016-04-22 ENCOUNTER — Telehealth: Payer: Self-pay | Admitting: Internal Medicine

## 2016-04-22 NOTE — Telephone Encounter (Signed)
S/w Maudie Mercury (pt's daughter) advised 7/12 appt chgd to 7/25 @ 9am due to md pal. Maudie Mercury verbalized understanding but states they may need to call back to chg 7/25 appt.Chelsea Cruz

## 2016-04-27 ENCOUNTER — Other Ambulatory Visit (HOSPITAL_BASED_OUTPATIENT_CLINIC_OR_DEPARTMENT_OTHER): Payer: Medicare Other

## 2016-04-27 ENCOUNTER — Ambulatory Visit (HOSPITAL_COMMUNITY)
Admission: RE | Admit: 2016-04-27 | Discharge: 2016-04-27 | Disposition: A | Discharge status: Home / Self Care | Payer: Medicare Other | Source: Ambulatory Visit | Attending: Internal Medicine | Admitting: Internal Medicine

## 2016-04-27 DIAGNOSIS — C7931 Secondary malignant neoplasm of brain: Secondary | ICD-10-CM | POA: Insufficient documentation

## 2016-04-27 DIAGNOSIS — C3432 Malignant neoplasm of lower lobe, left bronchus or lung: Secondary | ICD-10-CM

## 2016-04-27 DIAGNOSIS — J701 Chronic and other pulmonary manifestations due to radiation: Secondary | ICD-10-CM | POA: Diagnosis not present

## 2016-04-27 DIAGNOSIS — C3412 Malignant neoplasm of upper lobe, left bronchus or lung: Secondary | ICD-10-CM

## 2016-04-27 DIAGNOSIS — R911 Solitary pulmonary nodule: Secondary | ICD-10-CM | POA: Insufficient documentation

## 2016-04-27 DIAGNOSIS — J90 Pleural effusion, not elsewhere classified: Secondary | ICD-10-CM | POA: Diagnosis not present

## 2016-04-27 LAB — COMPREHENSIVE METABOLIC PANEL
ALBUMIN: 3.8 g/dL (ref 3.5–5.0)
ALK PHOS: 75 U/L (ref 40–150)
ALT: 21 U/L (ref 0–55)
AST: 21 U/L (ref 5–34)
Anion Gap: 11 mEq/L (ref 3–11)
BUN: 15.3 mg/dL (ref 7.0–26.0)
CHLORIDE: 105 meq/L (ref 98–109)
CO2: 27 mEq/L (ref 22–29)
Calcium: 9.9 mg/dL (ref 8.4–10.4)
Creatinine: 1 mg/dL (ref 0.6–1.1)
EGFR: 55 mL/min/{1.73_m2} — AB (ref >90)
GLUCOSE: 102 mg/dL (ref 70–140)
POTASSIUM: 4.1 meq/L (ref 3.5–5.1)
SODIUM: 143 meq/L (ref 136–145)
TOTAL PROTEIN: 7 g/dL (ref 6.4–8.3)
Total Bilirubin: 1.33 mg/dL — ABNORMAL HIGH (ref 0.20–1.20)

## 2016-04-27 LAB — CBC WITH DIFFERENTIAL/PLATELET
BASO%: 0.6 % (ref 0.0–2.0)
BASOS ABS: 0 10*3/uL (ref 0.0–0.1)
EOS%: 4.1 % (ref 0.0–7.0)
Eosinophils Absolute: 0.3 10*3/uL (ref 0.0–0.5)
HEMATOCRIT: 40.1 % (ref 34.8–46.6)
HEMOGLOBIN: 12.8 g/dL (ref 11.6–15.9)
LYMPH#: 2.1 10*3/uL (ref 0.9–3.3)
LYMPH%: 33.1 % (ref 14.0–49.7)
MCH: 29 pg (ref 25.1–34.0)
MCHC: 31.9 g/dL (ref 31.5–36.0)
MCV: 90.7 fL (ref 79.5–101.0)
MONO#: 0.4 10*3/uL (ref 0.1–0.9)
MONO%: 6.5 % (ref 0.0–14.0)
NEUT%: 55.7 % (ref 38.4–76.8)
NEUTROS ABS: 3.5 10*3/uL (ref 1.5–6.5)
Platelets: 227 10*3/uL (ref 145–400)
RBC: 4.42 10*6/uL (ref 3.70–5.45)
RDW: 13.8 % (ref 11.2–14.5)
WBC: 6.4 10*3/uL (ref 3.9–10.3)

## 2016-04-27 MED ORDER — IOPAMIDOL (ISOVUE-300) INJECTION 61%
75.0000 mL | Freq: Once | INTRAVENOUS | Status: AC | PRN
  Administered 2016-04-27: 75 mL via INTRAVENOUS

## 2016-04-28 ENCOUNTER — Ambulatory Visit: Payer: Medicare Other | Admitting: Internal Medicine

## 2016-05-11 ENCOUNTER — Encounter: Payer: Self-pay | Admitting: Internal Medicine

## 2016-05-11 ENCOUNTER — Telehealth: Payer: Self-pay | Admitting: Internal Medicine

## 2016-05-11 ENCOUNTER — Ambulatory Visit (HOSPITAL_BASED_OUTPATIENT_CLINIC_OR_DEPARTMENT_OTHER): Payer: Medicare Other | Admitting: Internal Medicine

## 2016-05-11 VITALS — BP 138/58 | HR 92 | Temp 98.6°F | Resp 18 | Ht 61.0 in | Wt 129.8 lb

## 2016-05-11 DIAGNOSIS — Z7901 Long term (current) use of anticoagulants: Secondary | ICD-10-CM | POA: Diagnosis not present

## 2016-05-11 DIAGNOSIS — Z86711 Personal history of pulmonary embolism: Secondary | ICD-10-CM | POA: Diagnosis not present

## 2016-05-11 DIAGNOSIS — Z85118 Personal history of other malignant neoplasm of bronchus and lung: Secondary | ICD-10-CM | POA: Diagnosis not present

## 2016-05-11 DIAGNOSIS — C3432 Malignant neoplasm of lower lobe, left bronchus or lung: Secondary | ICD-10-CM

## 2016-05-11 NOTE — Progress Notes (Signed)
[facility] Telephone:(336) [phone]   Fax:(336) [place], [place] [address]  DIAGNOSIS: Metastatic non-small cell lung cancer initially diagnosed as Unresectable Stage IIB (T3, N0, M0) non-small cell lung cancer, poorly differentiated squamous cell carcinoma diagnosed in June of 2014.  The patient was diagnosed with metastatic lesion to the brain in January 2016.  PRIOR THERAPY:  1) Concurrent chemoradiation with weekly carboplatin for AUC of 2 and paclitaxel 45 mg/M2. Status post 5 cycles. First cycle was given on 04/09/2013. Last dose was given on 05/21/2013 with partial response.  2) Consolidation chemotherapy with carboplatin for AUC of 4 and paclitaxel 150 mg/M2 every 3 weeks with Neulasta support. First dose given on 07/31/2013. Status post 3 cycles with stable disease. 3) status post a stereotactic radiotherapy under the care of Dr. Tammi Klippel on 11/06/2014. 4) status post craniotomy with tumor resection under the care of Dr. Vertell Limber on 11/08/2014.  CURRENT THERAPY:  1) Observation. 2) Xarelto 20 mg by mouth daily for history of pulmonary embolism.  CHEMOTHERAPY INTENT: Control/curative  CURRENT # OF CHEMOTHERAPY CYCLES: 0 CURRENT ANTIEMETICS: Zofran, dexamethasone and Compazine  CURRENT SMOKING STATUS: Former smoker  ORAL CHEMOTHERAPY AND CONSENT: None  CURRENT BISPHOSPHONATES USE: None  PAIN MANAGEMENT: 0/10  NARCOTICS INDUCED CONSTIPATION: None  LIVING WILL AND CODE STATUS: Full code   INTERVAL HISTORY: Chelsea Cruz 75 y.o. female returns to the clinic today for followup visit accompanied by her daughter. The patient has no complaints. She denied having any significant chest pain, shortness of breath, cough or hemoptysis. The patient denied having any significant weight loss or night sweats. She has no nausea or vomiting. No fever or chills. She had repeat CT scan of the chest performed  recently and she is here for evaluation and discussion of her scan results.  MEDICAL HISTORY: Past Medical History:  Diagnosis Date  . Brain cancer ([place])    squamous cell carcinoma of lung with a solitary brain met  . COPD (chronic obstructive pulmonary disease) ([place])   . Depression   . DNR no code (do not resuscitate) 07/23/2015  . GERD (gastroesophageal reflux disease)   . Hypercholesteremia   . Hypertension   . Impaired fasting glucose   . Lung cancer ([place]) dx'd 02/2013  . Nicotine addiction   . Nodule of left lung   . Osteomalacia   . Osteopenia     ALLERGIES:  is allergic to penicillins.  MEDICATIONS:  Current Outpatient Prescriptions  Medication Sig Dispense Refill  . acetaminophen (TYLENOL) 325 MG tablet Take 650 mg by mouth every 6 (six) hours as needed (for pain.).    Marland Kitchen Biotin 10 MG CAPS Take 1 each by mouth daily.    . cholecalciferol (VITAMIN D) 400 UNITS TABS tablet Take 2,000 Units by mouth daily.     Alveda Reasons 20 MG TABS tablet   1   No current facility-administered medications for this visit.     SURGICAL HISTORY:  Past Surgical History:  Procedure Laterality Date  . APPENDECTOMY    . BILATERAL CARPAL TUNNEL RELEASE    . BRAIN SURGERY     tumor removal 11/08/2014  . CATARACT EXTRACTION W/ INTRAOCULAR LENS  IMPLANT, BILATERAL    . CHOLECYSTECTOMY    . CRANIOTOMY N/A 11/08/2014   Procedure: CRANIOTOMY TUMOR EXCISION;  Surgeon: Erline Levine, MD;  Location: [facility];  Service: Neurosurgery;  Laterality: N/A;  CRANIOTOMY TUMOR EXCISION  .  RIGHT OOPHORECTOMY     1969    REVIEW OF SYSTEMS:  A comprehensive review of systems was negative.   PHYSICAL EXAMINATION: General appearance: alert, cooperative and no distress Head: Normocephalic, without obvious abnormality, atraumatic Neck: no adenopathy, no JVD, supple, symmetrical, trachea midline and thyroid not enlarged, symmetric, no tenderness/mass/nodules Lymph nodes: Cervical, supraclavicular, and axillary  nodes normal. Resp: clear to auscultation bilaterally Back: symmetric, no curvature. ROM normal. No CVA tenderness. Cardio: regular rate and rhythm, S1, S2 normal, no murmur, click, rub or gallop GI: soft, non-tender; bowel sounds normal; no masses,  no organomegaly Extremities: extremities normal, atraumatic, no cyanosis or edema Neurologic: Alert and oriented X 3, normal strength and tone. Normal symmetric reflexes. Normal coordination and gait  ECOG PERFORMANCE STATUS: 0 - Asymptomatic  Blood pressure (!) 138/58, pulse 92, temperature 98.6 F (37 C), temperature source Oral, resp. rate 18, height '5\' 1"'$  (1.549 m), weight 129 lb 12.8 oz (58.9 kg), SpO2 95 %.  LABORATORY DATA: Lab Results  Component Value Date   WBC 6.4 04/27/2016   HGB 12.8 04/27/2016   HCT 40.1 04/27/2016   MCV 90.7 04/27/2016   PLT 227 04/27/2016      Chemistry      Component Value Date/Time   NA 143 04/27/2016 0837   K 4.1 04/27/2016 0837   CL 99 12/28/2014 0602   CL 98 04/09/2013 1114   CO2 27 04/27/2016 0837   BUN 15.3 04/27/2016 0837   CREATININE 1.0 04/27/2016 0837      Component Value Date/Time   CALCIUM 9.9 04/27/2016 0837   ALKPHOS 75 04/27/2016 0837   AST 21 04/27/2016 0837   ALT 21 04/27/2016 0837   BILITOT 1.33 (H) 04/27/2016 0837       RADIOGRAPHIC STUDIES: Ct Chest W Contrast  Result Date: 04/27/2016 CLINICAL DATA:  Restaging metastatic left lung bronchogenic carcinoma status post chemotherapy and radiation therapy. EXAM: CT CHEST WITH CONTRAST TECHNIQUE: Multidetector CT imaging of the chest was performed during intravenous contrast administration. CONTRAST:  86m ISOVUE-300 IOPAMIDOL (ISOVUE-300) INJECTION 61% COMPARISON:  10/22/2015 chest CT. FINDINGS: Mediastinum/Nodes: Normal heart size. Stable trace pericardial fluid/ thickening. Left anterior descending and right coronary atherosclerosis. Atherosclerotic nonaneurysmal thoracic aorta. Normal caliber pulmonary arteries. No central  pulmonary emboli. No discrete thyroid nodules. Unremarkable esophagus. No axillary adenopathy. No pathologically enlarged mediastinal or hilar nodes. Lungs/Pleura: No pneumothorax. No right pleural effusion. Stable small left pleural effusion. New 4 mm solid posterior apical right upper lobe pulmonary nodule (series 5/ image 23). Stable sharply marginated consolidation, volume loss, distortion item bronchiectasis throughout the parahilar left mid lung, consistent with radiation fibrosis. No acute consolidative airspace disease or additional significant pulmonary nodules. Mild centrilobular and paraseptal emphysema with diffuse bronchial wall thickening. Upper abdomen: Cholecystectomy. Bile ducts are within expected post cholecystectomy limits and appears stable. Adrenal glands are stable in appearance with no discrete adrenal nodules. Musculoskeletal: No aggressive appearing focal osseous lesions. 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 Mild thoracic spondylosis. IMPRESSION: 1. 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 2. No thoracic adenopathy. 3. New 4 mm apical right upper lobe pulmonary nodule, nonspecific, recommend attention on follow-up chest CT in 3 months. 4. Stable small left pleural effusion. 5. Additional incidental findings as above. Electronically Signed   By: JIlona SorrelM.D.   On: 04/27/2016 12:21   ASSESSMENT AND PLAN:  She is  a very pleasant 75 years old white female with metastatic non-small cell lung cancer initially diagnosed as unresectable stage IIB non-small cell lung cancer. She status post concurrent chemoradiation and recently completed 3 cycles of consolidation chemotherapy with carboplatin and paclitaxel. She was recently found to have metastatic brain lesion in January 2016 and the patient underwent stereotactic  radiotherapy followed by surgical resection of the brain tumor.  The recent CT scan of the chest showed no evidence for disease progression except for new 4 mm apical right upper lobe pulmonary nodule that nonspecific but require attention on upcoming scans.  I discussed the scan results with the patient and her daughter. I recommended for her to continue on observation with repeat CT scan of the chest in 3 months for close monitoring of the new pulmonary nodule. CODE STATUS was discussed with the patient and she indicated no CODE BLUE. For the history of pulmonary embolism, the patient will continue on Xarelto 20 mg by mouth daily. She was advised to call immediately if she has any concerning symptoms in the interval.  The patient voices understanding of current disease status and treatment options and is in agreement with the current care plan.  All questions were answered. The patient knows to call the clinic with any problems, questions or concerns. We can certainly see the patient much sooner if necessary.  Disclaimer: This note was dictated with voice recognition software. Similar sounding words can inadvertently be transcribed and may not be corrected upon review.   Eilleen Kempf., MD 05/11/16

## 2016-05-11 NOTE — Telephone Encounter (Signed)
Ave pt cal & avs

## 2016-06-08 ENCOUNTER — Other Ambulatory Visit: Payer: Medicare Other

## 2016-06-14 ENCOUNTER — Ambulatory Visit: Payer: Self-pay | Admitting: Radiation Oncology

## 2016-06-15 ENCOUNTER — Ambulatory Visit
Admission: RE | Admit: 2016-06-15 | Discharge: 2016-06-15 | Disposition: A | Discharge status: Home / Self Care | Payer: Medicare Other | Source: Ambulatory Visit | Attending: Radiation Oncology | Admitting: Radiation Oncology

## 2016-06-15 ENCOUNTER — Other Ambulatory Visit: Payer: Medicare Other

## 2016-06-15 DIAGNOSIS — C7949 Secondary malignant neoplasm of other parts of nervous system: Principal | ICD-10-CM

## 2016-06-15 DIAGNOSIS — C7931 Secondary malignant neoplasm of brain: Secondary | ICD-10-CM

## 2016-06-15 MED ORDER — GADOBENATE DIMEGLUMINE 529 MG/ML IV SOLN
11.0000 mL | Freq: Once | INTRAVENOUS | Status: AC | PRN
Start: 2016-06-15 — End: 2016-06-15
  Administered 2016-06-15: 11 mL via INTRAVENOUS

## 2016-06-16 ENCOUNTER — Telehealth: Payer: Self-pay | Admitting: Radiation Therapy

## 2016-06-16 NOTE — Telephone Encounter (Signed)
Per Dr. Tammi Klippel it is OK to call Veta with her most recent MRI results and cancel her follow-up scheduled for 8/31.   I spoke with Abri's daughter, Maudie Mercury. Passed along that her mom's recent scan was stable without any new areas for concern. The group discussion from our Mult-Disciplinary Brain and Spine Conference, was to continue follow-up with another scan in 3 months. Maudie Mercury was very happy to hear this positive news and understands to call us with any concerns or questions.  Mont Dutton R.T.(R)(T) Special Procedures Navigator  Radiation Oncology

## 2016-06-17 ENCOUNTER — Ambulatory Visit: Admission: RE | Admit: 2016-06-17 | Payer: Medicare Other | Source: Ambulatory Visit | Admitting: Radiation Oncology

## 2016-06-17 ENCOUNTER — Ambulatory Visit: Payer: Self-pay | Admitting: Radiation Oncology

## 2016-07-06 ENCOUNTER — Ambulatory Visit: Payer: Self-pay | Admitting: Radiation Oncology

## 2016-07-12 ENCOUNTER — Ambulatory Visit: Payer: Self-pay | Admitting: Radiation Oncology

## 2016-08-09 ENCOUNTER — Other Ambulatory Visit (HOSPITAL_BASED_OUTPATIENT_CLINIC_OR_DEPARTMENT_OTHER): Payer: Medicare Other

## 2016-08-09 ENCOUNTER — Encounter (HOSPITAL_COMMUNITY): Payer: Self-pay

## 2016-08-09 ENCOUNTER — Ambulatory Visit (HOSPITAL_COMMUNITY)
Admission: RE | Admit: 2016-08-09 | Discharge: 2016-08-09 | Disposition: A | Discharge status: Home / Self Care | Payer: Medicare Other | Source: Ambulatory Visit | Attending: Internal Medicine | Admitting: Internal Medicine

## 2016-08-09 DIAGNOSIS — C3432 Malignant neoplasm of lower lobe, left bronchus or lung: Secondary | ICD-10-CM

## 2016-08-09 DIAGNOSIS — R911 Solitary pulmonary nodule: Secondary | ICD-10-CM | POA: Diagnosis not present

## 2016-08-09 DIAGNOSIS — Z85118 Personal history of other malignant neoplasm of bronchus and lung: Secondary | ICD-10-CM

## 2016-08-09 LAB — CBC WITH DIFFERENTIAL/PLATELET
BASO%: 0.8 % (ref 0.0–2.0)
Basophils Absolute: 0.1 10*3/uL (ref 0.0–0.1)
EOS ABS: 0.2 10*3/uL (ref 0.0–0.5)
EOS%: 2.4 % (ref 0.0–7.0)
HCT: 39.3 % (ref 34.8–46.6)
HEMOGLOBIN: 12.7 g/dL (ref 11.6–15.9)
LYMPH%: 27.9 % (ref 14.0–49.7)
MCH: 28.8 pg (ref 25.1–34.0)
MCHC: 32.2 g/dL (ref 31.5–36.0)
MCV: 89.5 fL (ref 79.5–101.0)
MONO#: 0.4 10*3/uL (ref 0.1–0.9)
MONO%: 5.5 % (ref 0.0–14.0)
NEUT%: 63.4 % (ref 38.4–76.8)
NEUTROS ABS: 4.7 10*3/uL (ref 1.5–6.5)
Platelets: 158 10*3/uL (ref 145–400)
RBC: 4.39 10*6/uL (ref 3.70–5.45)
RDW: 14.1 % (ref 11.2–14.5)
WBC: 7.4 10*3/uL (ref 3.9–10.3)
lymph#: 2.1 10*3/uL (ref 0.9–3.3)

## 2016-08-09 LAB — COMPREHENSIVE METABOLIC PANEL
ALBUMIN: 3.7 g/dL (ref 3.5–5.0)
ALT: 21 U/L (ref 0–55)
AST: 22 U/L (ref 5–34)
Alkaline Phosphatase: 86 U/L (ref 40–150)
Anion Gap: 11 mEq/L (ref 3–11)
BUN: 14.4 mg/dL (ref 7.0–26.0)
CHLORIDE: 106 meq/L (ref 98–109)
CO2: 26 mEq/L (ref 22–29)
CREATININE: 0.9 mg/dL (ref 0.6–1.1)
Calcium: 9.9 mg/dL (ref 8.4–10.4)
EGFR: 62 mL/min/{1.73_m2} — AB (ref >90)
Glucose: 117 mg/dl (ref 70–140)
Potassium: 4.4 mEq/L (ref 3.5–5.1)
SODIUM: 142 meq/L (ref 136–145)
TOTAL PROTEIN: 7.1 g/dL (ref 6.4–8.3)
Total Bilirubin: 1.2 mg/dL (ref 0.20–1.20)

## 2016-08-09 MED ORDER — IOPAMIDOL (ISOVUE-300) INJECTION 61%
75.0000 mL | Freq: Once | INTRAVENOUS | Status: AC | PRN
  Administered 2016-08-09: 75 mL via INTRAVENOUS

## 2016-08-17 ENCOUNTER — Ambulatory Visit (HOSPITAL_BASED_OUTPATIENT_CLINIC_OR_DEPARTMENT_OTHER): Payer: Medicare Other | Admitting: Internal Medicine

## 2016-08-17 ENCOUNTER — Telehealth: Payer: Self-pay | Admitting: Internal Medicine

## 2016-08-17 ENCOUNTER — Encounter: Payer: Self-pay | Admitting: Internal Medicine

## 2016-08-17 VITALS — BP 138/52 | HR 82 | Temp 97.7°F | Resp 16 | Ht 61.0 in | Wt 133.6 lb

## 2016-08-17 DIAGNOSIS — Z86711 Personal history of pulmonary embolism: Secondary | ICD-10-CM

## 2016-08-17 DIAGNOSIS — C3490 Malignant neoplasm of unspecified part of unspecified bronchus or lung: Secondary | ICD-10-CM | POA: Diagnosis not present

## 2016-08-17 DIAGNOSIS — C7931 Secondary malignant neoplasm of brain: Secondary | ICD-10-CM

## 2016-08-17 DIAGNOSIS — C3432 Malignant neoplasm of lower lobe, left bronchus or lung: Secondary | ICD-10-CM

## 2016-08-17 DIAGNOSIS — Z7901 Long term (current) use of anticoagulants: Secondary | ICD-10-CM

## 2016-08-17 NOTE — Progress Notes (Signed)
[facility] [phone]   Fax:([phone]) [place], [place] [address]  DIAGNOSIS: Metastatic non-small cell lung cancer initially diagnosed as Unresectable Stage IIB (T3, N0, M0) non-small cell lung cancer, poorly differentiated squamous cell carcinoma diagnosed in June of 2014.  The patient was diagnosed with metastatic lesion to the brain in January 2016.  PRIOR THERAPY:  1) Concurrent chemoradiation with weekly carboplatin for AUC of 2 and paclitaxel 45 mg/M2. Status post 5 cycles. First cycle was given on 04/09/2013. Last dose was given on 05/21/2013 with partial response.  2) Consolidation chemotherapy with carboplatin for AUC of 4 and paclitaxel 150 mg/M2 every 3 weeks with Neulasta support. First dose given on 07/31/2013. Status post 3 cycles with stable disease. 3) status post a stereotactic radiotherapy under the care of Dr. Tammi Klippel on 11/06/2014. 4) status post craniotomy with tumor resection under the care of Dr. Vertell Limber on 11/08/2014.  CURRENT THERAPY:  1) Observation. 2) Xarelto 20 mg by mouth daily for history of pulmonary embolism.  CHEMOTHERAPY INTENT: Control/curative  CURRENT # OF CHEMOTHERAPY CYCLES: 0 CURRENT ANTIEMETICS: Zofran, dexamethasone and Compazine  CURRENT SMOKING STATUS: Former smoker  ORAL CHEMOTHERAPY AND CONSENT: None  CURRENT BISPHOSPHONATES USE: None  PAIN MANAGEMENT: 0/10  NARCOTICS INDUCED CONSTIPATION: None  LIVING WILL AND CODE STATUS: Full code   INTERVAL HISTORY: Chelsea Cruz 75 y.o. female returns to the clinic today for followup visit accompanied by her daughter and son. The patient has no complaints except for chest congestion secondary to allergy. She does not take any medications. She denied having any significant chest pain, shortness of breath, cough or hemoptysis. The patient denied having any significant weight loss or night sweats. She  has no nausea or vomiting. No fever or chills. She had repeat CT scan of the chest performed recently and she is here for evaluation and discussion of her scan results.  MEDICAL HISTORY: Past Medical History:  Diagnosis Date  . Brain cancer ([place])    squamous cell carcinoma of lung with a solitary brain met  . COPD (chronic obstructive pulmonary disease) ([place])   . Depression   . DNR no code (do not resuscitate) 07/23/2015  . GERD (gastroesophageal reflux disease)   . Hypercholesteremia   . Hypertension   . Impaired fasting glucose   . Lung cancer ([place]) dx'd 02/2013  . Nicotine addiction   . Nodule of left lung   . Osteomalacia   . Osteopenia     ALLERGIES:  is allergic to penicillins.  MEDICATIONS:  Current Outpatient Prescriptions  Medication Sig Dispense Refill  . acetaminophen (TYLENOL) 325 MG tablet Take 650 mg by mouth every 6 (six) hours as needed (for pain.).    Marland Kitchen Biotin 10 MG CAPS Take 1 each by mouth daily.    . cholecalciferol (VITAMIN D) 400 UNITS TABS tablet Take 2,000 Units by mouth daily.     Alveda Reasons 20 MG TABS tablet   1   No current facility-administered medications for this visit.     SURGICAL HISTORY:  Past Surgical History:  Procedure Laterality Date  . APPENDECTOMY    . BILATERAL CARPAL TUNNEL RELEASE    . BRAIN SURGERY     tumor removal 11/08/2014  . CATARACT EXTRACTION W/ INTRAOCULAR LENS  IMPLANT, BILATERAL    . CHOLECYSTECTOMY    . CRANIOTOMY N/A 11/08/2014   Procedure: CRANIOTOMY TUMOR EXCISION;  Surgeon: Erline Levine, MD;  Location: [facility];  Service: Neurosurgery;  Laterality: N/A;  CRANIOTOMY TUMOR EXCISION  . RIGHT OOPHORECTOMY     1969    REVIEW OF SYSTEMS:  A comprehensive review of systems was negative.   PHYSICAL EXAMINATION: General appearance: alert, cooperative and no distress Head: Normocephalic, without obvious abnormality, atraumatic Neck: no adenopathy, no JVD, supple, symmetrical, trachea midline and thyroid not enlarged,  symmetric, no tenderness/mass/nodules Lymph nodes: Cervical, supraclavicular, and axillary nodes normal. Resp: clear to auscultation bilaterally Back: symmetric, no curvature. ROM normal. No CVA tenderness. Cardio: regular rate and rhythm, S1, S2 normal, no murmur, click, rub or gallop GI: soft, non-tender; bowel sounds normal; no masses,  no organomegaly Extremities: extremities normal, atraumatic, no cyanosis or edema Neurologic: Alert and oriented X 3, normal strength and tone. Normal symmetric reflexes. Normal coordination and gait  ECOG PERFORMANCE STATUS: 0 - Asymptomatic  Blood pressure (!) 138/52, pulse 82, temperature 97.7 F (36.5 C), temperature source Oral, resp. rate 16, height 5\' 1"  (1.549 m), weight 133 lb 9.6 oz (60.6 kg), SpO2 95 %.  LABORATORY DATA: Lab Results  Component Value Date   WBC 7.4 08/09/2016   HGB 12.7 08/09/2016   HCT 39.3 08/09/2016   MCV 89.5 08/09/2016   PLT 158 08/09/2016      Chemistry      Component Value Date/Time   NA 142 08/09/2016 0805   K 4.4 08/09/2016 0805   CL 99 12/28/2014 0602   CL 98 04/09/2013 1114   CO2 26 08/09/2016 0805   BUN 14.4 08/09/2016 0805   CREATININE 0.9 08/09/2016 0805      Component Value Date/Time   CALCIUM 9.9 08/09/2016 0805   ALKPHOS 86 08/09/2016 0805   AST 22 08/09/2016 0805   ALT 21 08/09/2016 0805   BILITOT 1.20 08/09/2016 0805       RADIOGRAPHIC STUDIES: Ct Chest W Contrast  Result Date: 08/09/2016 CLINICAL DATA:  Primary cancer LEFT lower lung. EXAM: CT CHEST WITH CONTRAST TECHNIQUE: Multidetector CT imaging of the chest was performed during intravenous contrast administration. CONTRAST:  92mL ISOVUE-300 IOPAMIDOL (ISOVUE-300) INJECTION 61% COMPARISON:  CT 04/27/2016 FINDINGS: Cardiovascular: Small pericardial effusion along the LEFT ventricle measuring 10 mm in thickness similar to comparison exam. No central pulmonary embolism. Mediastinum/Nodes: No axillary supraclavicular adenopathy.  Mediastinal adenoma Lungs/Pleura: There is volume loss in the LEFT hemi thorax with consolidation and bronchiectasis in the LEFT lower lobe with findings not changed from comparison CT. Within the LEFT lower lobe new peripheral nodule measuring 5 mm with central low-attenuation (image 57, series 4). Within the RIGHT upper lobe previously identified nodule measures 6 mm increased from 4 mm (image 29, series 4). There is centrilobular emphysema in the lungs Upper Abdomen: Limited view of the liver, kidneys, pancreas are unremarkable. Normal adrenal glands. Musculoskeletal: No aggressive osseous lesion. IMPRESSION: 1. Stable post therapy change LEFT hemi thorax. 2. Interval enlargement of RIGHT upper lobe nodule. This nodule is likely too small for accurate PET characterization or biopsy at this stage. Recommend short-term CT surveillance. 3. New LEFT lower lobe pulmonary nodule with differential including tumor recurrence versus small focus of infection. Recommend follow-up with CT with impression 2. Electronically Signed   By: 06/28/2016 M.D.   On: 08/09/2016 10:03   ASSESSMENT AND PLAN:  She is a very pleasant 75 years old white female with metastatic non-small cell lung cancer initially diagnosed as unresectable stage IIB non-small cell lung cancer. She status post concurrent chemoradiation and recently completed 3 cycles  of consolidation chemotherapy with carboplatin and paclitaxel. She was recently found to have metastatic brain lesion in January 2016 and the patient underwent stereotactic radiotherapy followed by surgical resection of the brain tumor.  The recent CT scan of the chest showed no evidence for disease progression except for persistent slightly increased 6 mm apical right upper lobe pulmonary nodule as well as new left lower lobe 5 mm pulmonary nodule.   I discussed the scan results with the patient and her family. I recommended for her to continue on observation with repeat CT scan of  the chest in 3 months for close monitoring of the new pulmonary nodules. CODE STATUS was discussed with the patient and she indicated no CODE BLUE. For the history of pulmonary embolism, the patient will continue on Xarelto 20 mg by mouth daily. She was advised to call immediately if she has any concerning symptoms in the interval.  The patient voices understanding of current disease status and treatment options and is in agreement with the current care plan.  All questions were answered. The patient knows to call the clinic with any problems, questions or concerns. We can certainly see the patient much sooner if necessary.  Disclaimer: This note was dictated with voice recognition software. Similar sounding words can inadvertently be transcribed and may not be corrected upon review.   Eilleen Kempf., MD 08/17/16

## 2016-08-17 NOTE — Telephone Encounter (Signed)
Appointments scheduled per 10/31 LOS. Patient given AVS report and calendars with future scheduled appointments. Patient informed of CT scan.

## 2016-09-23 ENCOUNTER — Other Ambulatory Visit: Payer: Self-pay | Admitting: Radiation Therapy

## 2016-09-23 DIAGNOSIS — C7931 Secondary malignant neoplasm of brain: Secondary | ICD-10-CM

## 2016-09-23 DIAGNOSIS — C7949 Secondary malignant neoplasm of other parts of nervous system: Principal | ICD-10-CM

## 2016-09-30 ENCOUNTER — Ambulatory Visit
Admission: RE | Admit: 2016-09-30 | Discharge: 2016-09-30 | Disposition: A | Discharge status: Home / Self Care | Payer: Medicare Other | Source: Ambulatory Visit | Attending: Radiation Oncology | Admitting: Radiation Oncology

## 2016-09-30 DIAGNOSIS — C7949 Secondary malignant neoplasm of other parts of nervous system: Principal | ICD-10-CM

## 2016-09-30 DIAGNOSIS — C7931 Secondary malignant neoplasm of brain: Secondary | ICD-10-CM

## 2016-09-30 MED ORDER — GADOBENATE DIMEGLUMINE 529 MG/ML IV SOLN: 12.0000 mL | Freq: Once | INTRAVENOUS | Status: DC | PRN

## 2016-10-02 ENCOUNTER — Other Ambulatory Visit: Payer: Self-pay | Admitting: Nurse Practitioner

## 2016-10-04 ENCOUNTER — Ambulatory Visit
Admission: RE | Admit: 2016-10-04 | Discharge: 2016-10-04 | Disposition: A | Discharge status: Home / Self Care | Payer: Medicare Other | Source: Ambulatory Visit | Attending: Radiation Oncology | Admitting: Radiation Oncology

## 2016-10-04 ENCOUNTER — Encounter: Payer: Self-pay | Admitting: Radiation Oncology

## 2016-10-04 VITALS — BP 122/60 | HR 98 | Resp 16 | Wt 132.2 lb

## 2016-10-04 DIAGNOSIS — M8588 Other specified disorders of bone density and structure, other site: Secondary | ICD-10-CM | POA: Diagnosis not present

## 2016-10-04 DIAGNOSIS — C7931 Secondary malignant neoplasm of brain: Secondary | ICD-10-CM

## 2016-10-04 DIAGNOSIS — Z7902 Long term (current) use of antithrombotics/antiplatelets: Secondary | ICD-10-CM | POA: Insufficient documentation

## 2016-10-04 DIAGNOSIS — E7800 Pure hypercholesterolemia, unspecified: Secondary | ICD-10-CM | POA: Diagnosis not present

## 2016-10-04 DIAGNOSIS — Z8249 Family history of ischemic heart disease and other diseases of the circulatory system: Secondary | ICD-10-CM | POA: Diagnosis not present

## 2016-10-04 DIAGNOSIS — Z66 Do not resuscitate: Secondary | ICD-10-CM | POA: Diagnosis not present

## 2016-10-04 DIAGNOSIS — K219 Gastro-esophageal reflux disease without esophagitis: Secondary | ICD-10-CM | POA: Insufficient documentation

## 2016-10-04 DIAGNOSIS — C3492 Malignant neoplasm of unspecified part of left bronchus or lung: Secondary | ICD-10-CM | POA: Insufficient documentation

## 2016-10-04 DIAGNOSIS — Z808 Family history of malignant neoplasm of other organs or systems: Secondary | ICD-10-CM | POA: Insufficient documentation

## 2016-10-04 DIAGNOSIS — Z823 Family history of stroke: Secondary | ICD-10-CM | POA: Insufficient documentation

## 2016-10-04 DIAGNOSIS — Z880 Allergy status to penicillin: Secondary | ICD-10-CM | POA: Diagnosis not present

## 2016-10-04 DIAGNOSIS — I10 Essential (primary) hypertension: Secondary | ICD-10-CM | POA: Diagnosis not present

## 2016-10-04 DIAGNOSIS — J449 Chronic obstructive pulmonary disease, unspecified: Secondary | ICD-10-CM | POA: Diagnosis not present

## 2016-10-04 NOTE — Progress Notes (Signed)
Radiation Oncology         [phone] ________________________________  Name: Chelsea Cruz  MRN: [id number]  Date: 10/04/2016  DOB: Dec 25, 1940    Follow-Up Visit Note  CC: MCNEILL,[name], MD  Curt Bears, MD  Diagnosis:   75 y.o. woman with a solitary 3.4 cm left frontal brain metastasis from squamous cell carcinoma of the left upper lung:    ICD-9-CM ICD-10-CM   1. Solitary Left Frontal 3.4 cm Brain Metastasis 198.3 C79.31      Interval Since Last Radiation:  1 year and 11 months  11/06/14: Pre-op SRS to a left frontal 3.4 cm target to 15 Gy in 1 fraction   04/09/2013 and 05/24/2013: Conventional radiotherapy to the left lung with 66 Gy in 33 fractions   Narrative:  The patient comes today for follow-up of her most recent MRI of the brain. MRI of the brain on 09/30/16 was stable with no evidence of residual or recurrent disease. CT of the chest on 08/09/16 showed stable post therapy change in the left henmithorax, interval enlargement of a right upper lobe nodule (but is likely too small for accurate PET characterization or biopsy at this stage), and a new left lower lobe pulmonary nodule with differential including tumor recurrence vs small focus of infection.  Again she is followed by Dr. Julien Nordmann and continues under surveillance.   On review of system, the patient reports an occasional headache that doesn't require OTC medication to resolve. Denies pain, nausea, vomiting, or diplopia. Reports occasional dizziness. Reports occasional intermittent tinnitus and mild fatigue. Reports short term memory has improved. Reports difficulty finding words continues. A complete review of systems is obtained and is otherwise negative.  Past Medical History:  Past Medical History:  Diagnosis Date  . Brain cancer ([place])    squamous cell carcinoma of lung with a solitary brain met  . COPD (chronic obstructive pulmonary disease) ([place])   . Depression   . DNR no code (do not resuscitate)  07/23/2015  . GERD (gastroesophageal reflux disease)   . Hypercholesteremia   . Hypertension   . Impaired fasting glucose   . Lung cancer ([place]) dx'd 02/2013  . Nicotine addiction   . Nodule of left lung   . Osteomalacia   . Osteopenia     Past Surgical History: Past Surgical History:  Procedure Laterality Date  . APPENDECTOMY    . BILATERAL CARPAL TUNNEL RELEASE    . BRAIN SURGERY     tumor removal 11/08/2014  . CATARACT EXTRACTION W/ INTRAOCULAR LENS  IMPLANT, BILATERAL    . CHOLECYSTECTOMY    . CRANIOTOMY N/A 11/08/2014   Procedure: CRANIOTOMY TUMOR EXCISION;  Surgeon: Erline Levine, MD;  Location: [facility];  Service: Neurosurgery;  Laterality: N/A;  CRANIOTOMY TUMOR EXCISION  . RIGHT OOPHORECTOMY     1969    Social History:  Social History   Social History  . Marital status: Widowed    Spouse name: N/A  . Number of children: N/A  . Years of education: N/A   Occupational History  . retired    Social History Main Topics  . Smoking status: Former Smoker    Packs/day: 1.00    Years: 45.00    Types: Cigarettes    Quit date: 03/06/2013  . Smokeless tobacco: Never Used  . Alcohol use No  . Drug use: No  . Sexual activity: No   Other Topics Concern  . Not on file   Social History Narrative  . No narrative  on file    Family History: Family History  Problem Relation Age of Onset  . Heart disease Mother   . Coronary artery disease Brother   . Alzheimer's disease Brother   . Heart disease Brother   . Hypertension Brother     #2  . Stroke Brother     #2  . Brain cancer Brother     #3     ALLERGIES:  is allergic to penicillins.  Meds: Current Outpatient Prescriptions  Medication Sig Dispense Refill  . acetaminophen (TYLENOL) 325 MG tablet Take 650 mg by mouth every 6 (six) hours as needed (for pain.).    Marland Kitchen Biotin 10 MG CAPS Take 1 each by mouth daily.    . cholecalciferol (VITAMIN D) 400 UNITS TABS tablet Take 2,000 Units by mouth daily.     Alveda Reasons 20 MG TABS tablet   1   No current facility-administered medications for this encounter.     Physical Findings:  weight is 132 lb 3.2 oz (60 kg). Her blood pressure is 122/60 and her pulse is 98. Her respiration is 16 and oxygen saturation is 100%.   Pain scale 0/10 In general this is a well appearing caucasian female in no acute distress. She's alert and oriented x4 and appropriate throughout the examination. Cardiopulmonary assessment is negative for acute distress and she exhibits normal effort.  HEENT reveals that the patient is normocephalic, atraumatic. EOMs are intact. PERRLA. Skin is intact without any evidence of gross lesions. She appears to be neurologically intact grossly without focal abnormalities.    Lab Findings: Lab Results  Component Value Date   WBC 7.4 08/09/2016   WBC 4.8 12/29/2014   HGB 12.7 08/09/2016   HCT 39.3 08/09/2016   PLT 158 08/09/2016    Lab Results  Component Value Date   NA 142 08/09/2016   K 4.4 08/09/2016   CHLORIDE 106 08/09/2016   CO2 26 08/09/2016   GLUCOSE 117 08/09/2016   GLUCOSE 102 (H) 04/09/2013   BUN 14.4 08/09/2016   CREATININE 0.9 08/09/2016   BILITOT 1.20 08/09/2016   ALKPHOS 86 08/09/2016   AST 22 08/09/2016   ALT 21 08/09/2016   PROT 7.1 08/09/2016   ALBUMIN 3.7 08/09/2016   CALCIUM 9.9 08/09/2016   ANIONGAP 11 08/09/2016   ANIONGAP 6 12/28/2014    Radiographic Findings: Mr [name] XA Contrast  Result Date: 09/30/2016 CLINICAL DATA:  Restaging metastatic lung cancer. EXAM: MRI HEAD WITHOUT AND WITH CONTRAST TECHNIQUE: Multiplanar, multiecho pulse sequences of the brain and surrounding structures were obtained without and with intravenous contrast. CONTRAST:  12 cc MultiHance COMPARISON:  06/15/2016 and multiple previous FINDINGS: Brain: The examination is unchanged since 4 months ago. Previous left frontal craniotomy for tumor section in the expected location of Broca's area with volume loss and enhancing scar.  This includes a 2 mm focus of enhancement along the superior margin of the resection. This is all stable. There is no evidence of increasing mass effect. No new focus of enhancement is seen anywhere. 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 Chronic small-vessel ischemic changes affect the cerebral hemispheric white matter. There is ex vacuo enlargement of the left lateral ventricle but no evidence of obstructive hydrocephalus. No extra-axial fluid collection. Vascular: Major vessels at the base of the brain show flow. Skull and upper cervical spine: Negative Sinuses/Orbits: Paranasal sinuses are clear. Small left mastoid effusion. Orbits negative. Other: None significant IMPRESSION: Stable examination. No evidence of  residual or recurrent disease. Stable post treatment findings in the left posterior inferior frontal region. Electronically Signed   By: Nelson Chimes M.D.   On: 09/30/2016 11:48    Impression:  1.  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 The patient continues with Dr. Julien Nordmann under surveillance. Radiographically and clinically, she is doing well and is with stable findings on MRI. We will follow-up with her in 4 months time after she undergoes repeat MRI of the brain. She will keep [place] informed any questions or concerns that arise prior to her next visit.  She does have a tiny nodule in the lung on Oct CT of chest.  If this enlarges and warrants further evaluation by Dr. Julien Nordmann, we'll be happy to discuss treatment options there.  ------------------------------------------------   Tyler Pita, MD [place] Director and Director of Stereotactic Radiosurgery Direct Dial: [phone]  Fax: [phone] [email or web address]  Skype  [email or web address]  This document serves as a record of services personally performed by Tyler Pita, MD. It was created  on his behalf by Darcus Austin, a trained medical scribe. The creation of this record is based on the scribe's personal observations and the provider's statements to them. This document has been checked and approved by the attending provider.

## 2016-10-04 NOTE — Progress Notes (Signed)
Weight and vitals stable. Denies pain. Reports an occasional headache that doesn't require OTC medication to resolve. Denies nausea or vomiting. Reports occasional dizziness. Denies diplopia. Reports occasional intermittent tinnitus. Reports mild fatigue. Reports short term memory has improved. Reports difficulty word finding continues.  Scheduled for chest CT 11/17/2016 and follow up with [facility] 11/24/16.  BP 122/60 (BP Location: Left Arm, Patient Position: Sitting, Cuff Size: Normal)   Pulse 98   Resp 16   Wt 132 lb 3.2 oz (60 kg)   SpO2 100%   BMI 24.98 kg/m  Wt Readings from Last 3 Encounters:  10/04/16 132 lb 3.2 oz (60 kg)  08/17/16 133 lb 9.6 oz (60.6 kg)  06/15/16 128 lb (58.1 kg)

## 2016-10-05 NOTE — Addendum Note (Signed)
Encounter addended by: Heywood Footman, RN on: 10/05/2016  3:52 PM<BR>    Actions taken: Charge Capture section accepted

## 2016-11-17 ENCOUNTER — Encounter (HOSPITAL_COMMUNITY): Payer: Self-pay

## 2016-11-17 ENCOUNTER — Ambulatory Visit (HOSPITAL_COMMUNITY)
Admission: RE | Admit: 2016-11-17 | Discharge: 2016-11-17 | Disposition: A | Discharge status: Home / Self Care | Payer: Medicare Other | Source: Ambulatory Visit | Attending: Internal Medicine | Admitting: Internal Medicine

## 2016-11-17 ENCOUNTER — Other Ambulatory Visit (HOSPITAL_BASED_OUTPATIENT_CLINIC_OR_DEPARTMENT_OTHER): Payer: Medicare Other

## 2016-11-17 DIAGNOSIS — C3490 Malignant neoplasm of unspecified part of unspecified bronchus or lung: Secondary | ICD-10-CM

## 2016-11-17 DIAGNOSIS — I2510 Atherosclerotic heart disease of native coronary artery without angina pectoris: Secondary | ICD-10-CM | POA: Insufficient documentation

## 2016-11-17 DIAGNOSIS — R911 Solitary pulmonary nodule: Secondary | ICD-10-CM | POA: Diagnosis not present

## 2016-11-17 DIAGNOSIS — C3432 Malignant neoplasm of lower lobe, left bronchus or lung: Secondary | ICD-10-CM | POA: Insufficient documentation

## 2016-11-17 DIAGNOSIS — Z923 Personal history of irradiation: Secondary | ICD-10-CM | POA: Diagnosis not present

## 2016-11-17 DIAGNOSIS — I700 Atherosclerosis of aorta: Secondary | ICD-10-CM | POA: Insufficient documentation

## 2016-11-17 DIAGNOSIS — J90 Pleural effusion, not elsewhere classified: Secondary | ICD-10-CM | POA: Insufficient documentation

## 2016-11-17 LAB — COMPREHENSIVE METABOLIC PANEL
ALBUMIN: 4.1 g/dL (ref 3.5–5.0)
ALT: 22 U/L (ref 0–55)
AST: 20 U/L (ref 5–34)
Alkaline Phosphatase: 94 U/L (ref 40–150)
Anion Gap: 11 mEq/L (ref 3–11)
BILIRUBIN TOTAL: 1.03 mg/dL (ref 0.20–1.20)
BUN: 14.6 mg/dL (ref 7.0–26.0)
CALCIUM: 10 mg/dL (ref 8.4–10.4)
CHLORIDE: 104 meq/L (ref 98–109)
CO2: 25 mEq/L (ref 22–29)
CREATININE: 0.9 mg/dL (ref 0.6–1.1)
EGFR: 60 mL/min/{1.73_m2} — ABNORMAL LOW (ref >90)
Glucose: 108 mg/dl (ref 70–140)
Potassium: 4.2 mEq/L (ref 3.5–5.1)
Sodium: 140 mEq/L (ref 136–145)
TOTAL PROTEIN: 7.5 g/dL (ref 6.4–8.3)

## 2016-11-17 LAB — CBC WITH DIFFERENTIAL/PLATELET
BASO%: 1.3 % (ref 0.0–2.0)
Basophils Absolute: 0.1 10*3/uL (ref 0.0–0.1)
EOS%: 3.8 % (ref 0.0–7.0)
Eosinophils Absolute: 0.3 10*3/uL (ref 0.0–0.5)
HEMATOCRIT: 40.8 % (ref 34.8–46.6)
HEMOGLOBIN: 13.3 g/dL (ref 11.6–15.9)
LYMPH#: 2.3 10*3/uL (ref 0.9–3.3)
LYMPH%: 31.5 % (ref 14.0–49.7)
MCH: 29.2 pg (ref 25.1–34.0)
MCHC: 32.7 g/dL (ref 31.5–36.0)
MCV: 89.5 fL (ref 79.5–101.0)
MONO#: 0.5 10*3/uL (ref 0.1–0.9)
MONO%: 6.5 % (ref 0.0–14.0)
NEUT%: 56.9 % (ref 38.4–76.8)
NEUTROS ABS: 4.2 10*3/uL (ref 1.5–6.5)
PLATELETS: 116 10*3/uL — AB (ref 145–400)
RBC: 4.56 10*6/uL (ref 3.70–5.45)
RDW: 14.3 % (ref 11.2–14.5)
WBC: 7.4 10*3/uL (ref 3.9–10.3)

## 2016-11-17 MED ORDER — IOPAMIDOL (ISOVUE-300) INJECTION 61%
75.0000 mL | Freq: Once | INTRAVENOUS | Status: AC | PRN
  Administered 2016-11-17: 75 mL via INTRAVENOUS

## 2016-11-17 MED ORDER — IOPAMIDOL (ISOVUE-300) INJECTION 61%
INTRAVENOUS | Status: AC
  Filled 2016-11-17: qty 75

## 2016-11-17 MED ORDER — SODIUM CHLORIDE 0.9 % IJ SOLN
INTRAMUSCULAR | Status: AC
  Filled 2016-11-17: qty 50

## 2016-11-24 ENCOUNTER — Encounter: Payer: Self-pay | Admitting: Internal Medicine

## 2016-11-24 ENCOUNTER — Telehealth: Payer: Self-pay | Admitting: Internal Medicine

## 2016-11-24 ENCOUNTER — Ambulatory Visit (HOSPITAL_BASED_OUTPATIENT_CLINIC_OR_DEPARTMENT_OTHER): Payer: Medicare Other | Admitting: Internal Medicine

## 2016-11-24 VITALS — BP 128/57 | HR 103 | Temp 97.6°F | Resp 16 | Ht 61.0 in | Wt 135.4 lb

## 2016-11-24 DIAGNOSIS — C3432 Malignant neoplasm of lower lobe, left bronchus or lung: Secondary | ICD-10-CM | POA: Diagnosis not present

## 2016-11-24 DIAGNOSIS — C7931 Secondary malignant neoplasm of brain: Secondary | ICD-10-CM | POA: Diagnosis not present

## 2016-11-24 NOTE — Progress Notes (Signed)
[facility] [phone]   Fax:(336) [place], [place] [address]  DIAGNOSIS: Metastatic non-small cell lung cancer initially diagnosed as Unresectable Stage IIB (T3, N0, M0) non-small cell lung cancer, poorly differentiated squamous cell carcinoma diagnosed in June of 2014.  The patient was diagnosed with metastatic lesion to the brain in January 2016.  PRIOR THERAPY:  1) Concurrent chemoradiation with weekly carboplatin for AUC of 2 and paclitaxel 45 mg/M2. Status post 5 cycles. First cycle was given on 04/09/2013. Last dose was given on 05/21/2013 with partial response.  2) Consolidation chemotherapy with carboplatin for AUC of 4 and paclitaxel 150 mg/M2 every 3 weeks with Neulasta support. First dose given on 07/31/2013. Status post 3 cycles with stable disease. 3) status post a stereotactic radiotherapy under the care of Dr. Tammi Klippel on 11/06/2014. 4) status post craniotomy with tumor resection under the care of Dr. Vertell Limber on 11/08/2014.  CURRENT THERAPY:  1) Observation. 2) Xarelto 20 mg by mouth daily for history of pulmonary embolism.  CHEMOTHERAPY INTENT: Control/curative  CURRENT # OF CHEMOTHERAPY CYCLES: 0 CURRENT ANTIEMETICS: Zofran, dexamethasone and Compazine  CURRENT SMOKING STATUS: Former smoker  ORAL CHEMOTHERAPY AND CONSENT: None  CURRENT BISPHOSPHONATES USE: None  PAIN MANAGEMENT: 0/10  NARCOTICS INDUCED CONSTIPATION: None  LIVING WILL AND CODE STATUS: Full code   INTERVAL HISTORY: Chelsea Cruz 76 y.o. female came to the clinic today for follow-up visit accompanied by her son. The patient is feeling fine today with no specific complaints. She has only shortness of breath with exertion. She denied having any chest pain, cough or hemoptysis. She has no significant weight loss or night sweats. She has no nausea or vomiting. She denied having any fever or chills. The  patient had repeat CT scan of the chest performed recently and she is here for evaluation and discussion of her scan results.  MEDICAL HISTORY: Past Medical History:  Diagnosis Date  . 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 COPD (chronic obstructive pulmonary disease) ([facility])   . Depression   . DNR no code (do not resuscitate) 07/23/2015  . GERD (gastroesophageal reflux disease)   . Hypercholesteremia   . Hypertension   . Impaired fasting glucose   . Lung cancer ([facility]) dx'd 02/2013  . Nicotine addiction   . Nodule of left lung   . Osteomalacia   . Osteopenia     ALLERGIES:  is allergic to penicillins.  MEDICATIONS:  Current Outpatient Prescriptions  Medication Sig Dispense Refill  . acetaminophen (TYLENOL) 325 MG tablet Take 650 mg by mouth every 6 (six) hours as needed (for pain.).    Marland Kitchen Biotin 10 MG CAPS Take 1 each by mouth daily.    . cholecalciferol (VITAMIN D) 400 UNITS TABS tablet Take 2,000 Units by mouth daily.     Alveda Reasons 20 MG TABS tablet   1   No current facility-administered medications for this visit.     SURGICAL HISTORY:  Past Surgical History:  Procedure Laterality Date  . APPENDECTOMY    . BILATERAL CARPAL TUNNEL RELEASE    . BRAIN SURGERY     tumor removal 11/08/2014  . CATARACT EXTRACTION W/ INTRAOCULAR LENS  IMPLANT, BILATERAL    . CHOLECYSTECTOMY    . CRANIOTOMY N/A 11/08/2014   Procedure: CRANIOTOMY TUMOR EXCISION;  Surgeon: Erline Levine, MD;  Location: [facility];  Service: Neurosurgery;  Laterality: N/A;  CRANIOTOMY TUMOR EXCISION  . RIGHT OOPHORECTOMY     1969    REVIEW OF SYSTEMS:  A comprehensive review of systems was negative except for: Respiratory: positive for dyspnea on exertion   PHYSICAL EXAMINATION: General appearance: alert, cooperative and no distress Head: Normocephalic, without obvious abnormality, atraumatic Neck: no adenopathy, no JVD, supple, symmetrical, trachea midline and thyroid  not enlarged, symmetric, no tenderness/mass/nodules Lymph nodes: Cervical, supraclavicular, and axillary nodes normal. Resp: clear to auscultation bilaterally Back: symmetric, no curvature. ROM normal. No CVA tenderness. Cardio: regular rate and rhythm, S1, S2 normal, no murmur, click, rub or gallop GI: soft, non-tender; bowel sounds normal; no masses,  no organomegaly Extremities: extremities normal, atraumatic, no cyanosis or edema  ECOG PERFORMANCE STATUS: 0 - Asymptomatic  Blood pressure (!) 128/57, pulse (!) 103, temperature 97.6 F (36.4 C), temperature source Oral, resp. rate 16, height '5\' 1"'$  (1.549 m), weight 135 lb 6.4 oz (61.4 kg), SpO2 97 %.  LABORATORY DATA: Lab Results  Component Value Date   WBC 7.4 11/17/2016   HGB 13.3 11/17/2016   HCT 40.8 11/17/2016   MCV 89.5 11/17/2016   PLT 116 (L) 11/17/2016      Chemistry      Component Value Date/Time   NA 140 11/17/2016 0836   K 4.2 11/17/2016 0836   CL 99 12/28/2014 0602   CL 98 04/09/2013 1114   CO2 25 11/17/2016 0836   BUN 14.6 11/17/2016 0836   CREATININE 0.9 11/17/2016 0836      Component Value Date/Time   CALCIUM 10.0 11/17/2016 0836   ALKPHOS 94 11/17/2016 0836   AST 20 11/17/2016 0836   ALT 22 11/17/2016 0836   BILITOT 1.03 11/17/2016 0836       RADIOGRAPHIC STUDIES: Ct Chest W Contrast  Result Date: 11/17/2016 CLINICAL DATA:  Left lower lobe lung cancer, restaging. Diagnosed 2014 with brain metastasis in 2016. Chemotherapy complete. EXAM: CT CHEST WITH CONTRAST TECHNIQUE: Multidetector CT imaging of the chest was performed during intravenous contrast administration. CONTRAST:  64m ISOVUE-300 IOPAMIDOL (ISOVUE-300) INJECTION 61% COMPARISON:  08/09/2016 FINDINGS: Cardiovascular: Advanced aortic and branch vessel atherosclerosis. Mild cardiomegaly with minimal pericardial fluid, likely physiologic. Right coronary artery atherosclerosis. No central pulmonary embolism, on this non-dedicated study.  Mediastinum/Nodes: No supraclavicular adenopathy. No mediastinal or hilar adenopathy. Tiny hiatal hernia. Lungs/Pleura: Loculated left-sided pleural effusion is small and similar. Moderate centrilobular emphysema. Posterior right apical pulmonary nodule measures maximally 8 mm on image 28/series 5. This is increased from 6 mm on the prior. Volume loss in the left hemithorax. The left lower lobe 5 mm nodule described on the prior exam is decreased, at 2-3 mm on image 55/series 5. Similar appearance of consolidation and traction bronchiectasis involving the left lower lobe, likely radiation induced. Upper Abdomen: Cholecystectomy. Normal imaged portions of the liver, spleen, pancreas, kidneys. Left worse than right adrenal thickening is unchanged. Musculoskeletal: Chronic sclerosis involving multiple anterior left-sided ribs, secondary to remote trauma. IMPRESSION: 1. Similar radiation changes in the left lower lobe with presumably secondary loculated left-sided pleural effusion. 2. Further enlargement of right upper lobe pulmonary nodule, which could represent isolated pulmonary metastasis or metachronous primary bronchogenic carcinoma. 3. The left lower lobe pulmonary nodule is decreased and likely infectious or inflammatory. 4.  Coronary artery atherosclerosis. Aortic atherosclerosis. Electronically Signed   By: KAbigail MiyamotoM.D.   On: 11/17/2016 12:26   ASSESSMENT AND PLAN:  This is a very pleasant 76years old white  female with metastatic non-small cell lung cancer that was initially diagnosed as unresectable stage IIB, status post concurrent chemoradiation followed by consolidation chemotherapy. She was also treated for metastatic brain lesion in January 2016 with a stereotactic radiotherapy followed by surgical resection. The patient is currently on observation and the recent CT scan of the chest showed no significant change in her scan except for mild enlargement of a right upper lobe pulmonary nodule  which is currently measures 0.8 cm. I discussed the scan results with the patient and her son. I recommended for her to continue on observation with repeat CT scan of the chest in 3 months for reevaluation of this nodule. If it continues to increase in size, I would consider the patient for a PET scan and referral to radiation oncology for consideration of stereotactic body radiotherapy. The patient and her son agreed to the current plan. She was advised to call immediately if she has any concerning symptoms in the interval. The patient voices understanding of current disease status and treatment options and is in agreement with the current care plan.  All questions were answered. The patient knows to call the clinic with any problems, questions or concerns. We can certainly see the patient much sooner if necessary. I spent 10 minutes counseling the patient face to face. The total time spent in the appointment was 15 minutes.  Disclaimer: This note was dictated with voice recognition software. Similar sounding words can inadvertently be transcribed and may not be corrected upon review.   Eilleen Kempf., MD 11/24/16

## 2016-11-24 NOTE — Telephone Encounter (Signed)
Appointments scheduled per 11/24/16 los. Patient was given a copy of the AVS report and appoibtment schedule per 11/24/16 los.

## 2017-02-22 ENCOUNTER — Other Ambulatory Visit: Payer: Medicare Other

## 2017-02-22 ENCOUNTER — Other Ambulatory Visit (HOSPITAL_BASED_OUTPATIENT_CLINIC_OR_DEPARTMENT_OTHER): Payer: Medicare Other

## 2017-02-22 ENCOUNTER — Ambulatory Visit (HOSPITAL_COMMUNITY)
Admission: RE | Admit: 2017-02-22 | Discharge: 2017-02-22 | Disposition: A | Discharge status: Home / Self Care | Payer: Medicare Other | Source: Ambulatory Visit | Attending: Internal Medicine | Admitting: Internal Medicine

## 2017-02-22 DIAGNOSIS — J432 Centrilobular emphysema: Secondary | ICD-10-CM | POA: Diagnosis not present

## 2017-02-22 DIAGNOSIS — I2510 Atherosclerotic heart disease of native coronary artery without angina pectoris: Secondary | ICD-10-CM | POA: Insufficient documentation

## 2017-02-22 DIAGNOSIS — C3432 Malignant neoplasm of lower lobe, left bronchus or lung: Secondary | ICD-10-CM

## 2017-02-22 DIAGNOSIS — R918 Other nonspecific abnormal finding of lung field: Secondary | ICD-10-CM | POA: Insufficient documentation

## 2017-02-22 DIAGNOSIS — I700 Atherosclerosis of aorta: Secondary | ICD-10-CM | POA: Insufficient documentation

## 2017-02-22 DIAGNOSIS — C7931 Secondary malignant neoplasm of brain: Secondary | ICD-10-CM

## 2017-02-22 LAB — CBC WITH DIFFERENTIAL/PLATELET
BASO%: 0.4 % (ref 0.0–2.0)
Basophils Absolute: 0 10*3/uL (ref 0.0–0.1)
EOS ABS: 0.2 10*3/uL (ref 0.0–0.5)
EOS%: 3.1 % (ref 0.0–7.0)
HCT: 41.7 % (ref 34.8–46.6)
HEMOGLOBIN: 12.9 g/dL (ref 11.6–15.9)
LYMPH%: 37.3 % (ref 14.0–49.7)
MCH: 28.5 pg (ref 25.1–34.0)
MCHC: 30.9 g/dL — ABNORMAL LOW (ref 31.5–36.0)
MCV: 92.3 fL (ref 79.5–101.0)
MONO#: 0.5 10*3/uL (ref 0.1–0.9)
MONO%: 6 % (ref 0.0–14.0)
NEUT%: 53.2 % (ref 38.4–76.8)
NEUTROS ABS: 4.1 10*3/uL (ref 1.5–6.5)
Platelets: 167 10*3/uL (ref 145–400)
RBC: 4.52 10*6/uL (ref 3.70–5.45)
RDW: 14 % (ref 11.2–14.5)
WBC: 7.6 10*3/uL (ref 3.9–10.3)
lymph#: 2.9 10*3/uL (ref 0.9–3.3)

## 2017-02-22 LAB — COMPREHENSIVE METABOLIC PANEL
ALBUMIN: 4.2 g/dL (ref 3.5–5.0)
ALK PHOS: 84 U/L (ref 40–150)
ALT: 21 U/L (ref 0–55)
AST: 20 U/L (ref 5–34)
Anion Gap: 10 mEq/L (ref 3–11)
BUN: 11.3 mg/dL (ref 7.0–26.0)
CO2: 29 meq/L (ref 22–29)
Calcium: 10.3 mg/dL (ref 8.4–10.4)
Chloride: 105 mEq/L (ref 98–109)
Creatinine: 0.9 mg/dL (ref 0.6–1.1)
EGFR: 61 mL/min/{1.73_m2} — ABNORMAL LOW (ref >90)
GLUCOSE: 88 mg/dL (ref 70–140)
POTASSIUM: 4.5 meq/L (ref 3.5–5.1)
SODIUM: 144 meq/L (ref 136–145)
TOTAL PROTEIN: 7.6 g/dL (ref 6.4–8.3)
Total Bilirubin: 1.05 mg/dL (ref 0.20–1.20)

## 2017-02-22 MED ORDER — IOPAMIDOL (ISOVUE-300) INJECTION 61%
INTRAVENOUS | Status: AC
Start: 2017-02-22 — End: 2017-02-22
  Administered 2017-02-22: 75 mL
  Filled 2017-02-22: qty 75

## 2017-02-24 ENCOUNTER — Ambulatory Visit (HOSPITAL_BASED_OUTPATIENT_CLINIC_OR_DEPARTMENT_OTHER): Payer: Medicare Other | Admitting: Internal Medicine

## 2017-02-24 ENCOUNTER — Encounter: Payer: Self-pay | Admitting: Internal Medicine

## 2017-02-24 ENCOUNTER — Telehealth: Payer: Self-pay | Admitting: Internal Medicine

## 2017-02-24 VITALS — BP 136/70 | HR 94 | Temp 98.4°F | Resp 17 | Ht 61.0 in | Wt 134.6 lb

## 2017-02-24 DIAGNOSIS — C3432 Malignant neoplasm of lower lobe, left bronchus or lung: Secondary | ICD-10-CM

## 2017-02-24 DIAGNOSIS — C7931 Secondary malignant neoplasm of brain: Secondary | ICD-10-CM

## 2017-02-24 DIAGNOSIS — Z85118 Personal history of other malignant neoplasm of bronchus and lung: Secondary | ICD-10-CM

## 2017-02-24 NOTE — Progress Notes (Signed)
[facility] [phone]   Fax:(336) [phone]  OFFICE VISIT PROGRESS NOTE  Cari Car[place], MD [place] [address]  DIAGNOSIS: Metastatic non-small cell lung cancer initially diagnosed as Unresectable Stage IIB (T3, N0, M0) non-small cell lung cancer, poorly differentiated squamous cell carcinoma diagnosed in June of 2014.  The patient was diagnosed with metastatic lesion to the brain in January 2016.  PRIOR THERAPY:  1) Concurrent chemoradiation with weekly carboplatin for AUC of 2 and paclitaxel 45 mg/M2. Status post 5 cycles. First cycle was given on 04/09/2013. Last dose was given on 05/21/2013 with partial response.  2) Consolidation chemotherapy with carboplatin for AUC of 4 and paclitaxel 150 mg/M2 every 3 weeks with Neulasta support. First dose given on 07/31/2013. Status post 3 cycles with stable disease. 3) status post a stereotactic radiotherapy under the care of Dr. Tammi Klippel on 11/06/2014. 4) status post craniotomy with tumor resection under the care of Dr. Vertell Limber on 11/08/2014.  CURRENT THERAPY:  1) Observation. 2) Xarelto 20 mg by mouth daily for history of pulmonary embolism.  CHEMOTHERAPY INTENT: Control/curative  CURRENT # OF CHEMOTHERAPY CYCLES: 0 CURRENT ANTIEMETICS: Zofran, dexamethasone and Compazine  CURRENT SMOKING STATUS: Former smoker  ORAL CHEMOTHERAPY AND CONSENT: None  CURRENT BISPHOSPHONATES USE: None  PAIN MANAGEMENT: 0/10  NARCOTICS INDUCED CONSTIPATION: None  LIVING WILL AND CODE STATUS: Full code   INTERVAL HISTORY: Chelsea Cruz 76 y.o. female returns to the clinic today for follow-up visit accompanied by her son. Her daughter-in-law was also on the phone during the visit. The patient is feeling fine today with no specific complaints. She denied having any chest pain but has shortness of breath with exertion with no cough or hemoptysis. She denied having any fever or chills. She has no nausea,  vomiting, diarrhea or constipation. She has no weight loss or night sweats. She had repeat CT scan of the chest performed recently and she is here for evaluation and discussion of the scan results.  MEDICAL HISTORY: Past Medical History:  Diagnosis Date  . Brain cancer ([facility])    squamous cell carcinoma of lung with a solitary brain met  . COPD (chronic obstructive pulmonary disease) ([facility])   . Depression   . DNR no code (do not resuscitate) 07/23/2015  . GERD (gastroesophageal reflux disease)   . Hypercholesteremia   . Hypertension   . Impaired fasting glucose   . Lung cancer ([facility]) dx'd 02/2013  . Nicotine addiction   . Nodule of left lung   . Osteomalacia   . Osteopenia     ALLERGIES:  is allergic to penicillins.  MEDICATIONS:  Current Outpatient Prescriptions  Medication Sig Dispense Refill  . acetaminophen (TYLENOL) 325 MG tablet Take 650 mg by mouth every 6 (six) hours as needed (for pain.).    Marland Kitchen Biotin 10 MG CAPS Take 1 each by mouth daily.    . cholecalciferol (VITAMIN D) 400 UNITS TABS tablet Take 2,000 Units by mouth daily.     Alveda Reasons 20 MG TABS tablet   1   No current facility-administered medications for this visit.     SURGICAL HISTORY:  Past Surgical History:  Procedure Laterality Date  . APPENDECTOMY    . BILATERAL CARPAL TUNNEL RELEASE    . BRAIN SURGERY     tumor removal 11/08/2014  . CATARACT EXTRACTION W/ INTRAOCULAR LENS  IMPLANT, BILATERAL    . CHOLECYSTECTOMY    . CRANIOTOMY N/A 11/08/2014   Procedure:  CRANIOTOMY TUMOR EXCISION;  Surgeon: Erline Levine, MD;  Location: [facility];  Service: Neurosurgery;  Laterality: N/A;  CRANIOTOMY TUMOR EXCISION  . RIGHT OOPHORECTOMY     1969    REVIEW OF SYSTEMS:  A comprehensive review of systems was negative except for: Respiratory: positive for dyspnea on exertion   PHYSICAL EXAMINATION: General appearance: alert, cooperative and no distress Head: Normocephalic, without obvious abnormality, atraumatic Neck:  no adenopathy, no JVD, supple, symmetrical, trachea midline and thyroid not enlarged, symmetric, no tenderness/mass/nodules Lymph nodes: Cervical, supraclavicular, and axillary nodes normal. Resp: clear to auscultation bilaterally Back: symmetric, no curvature. ROM normal. No CVA tenderness. Cardio: regular rate and rhythm, S1, S2 normal, no murmur, click, rub or gallop GI: soft, non-tender; bowel sounds normal; no masses,  no organomegaly Extremities: extremities normal, atraumatic, no cyanosis or edema  ECOG PERFORMANCE STATUS: 0 - Asymptomatic  Blood pressure 136/70, pulse 94, temperature 98.4 F (36.9 C), temperature source Oral, resp. rate 17, height _0  (1.549 m), weight 134 lb 9.6 oz (61.1 kg), SpO2 95 %.  LABORATORY DATA: Lab Results  Component Value Date   WBC 7.6 02/22/2017   HGB 12.9 02/22/2017   HCT 41.7 02/22/2017   MCV 92.3 02/22/2017   PLT 167 02/22/2017      Chemistry      Component Value Date/Time   NA 144 02/22/2017 1555   K 4.5 02/22/2017 1555   CL 99 12/28/2014 0602   CL 98 04/09/2013 1114   CO2 29 02/22/2017 1555   BUN 11.3 02/22/2017 1555   CREATININE 0.9 02/22/2017 1555      Component Value Date/Time   CALCIUM 10.3 02/22/2017 1555   ALKPHOS 84 02/22/2017 1555   AST 20 02/22/2017 1555   ALT 21 02/22/2017 1555   BILITOT 1.05 02/22/2017 1555       RADIOGRAPHIC STUDIES: Ct Chest W Contrast  Result Date: 02/23/2017 CLINICAL DATA:  76 year old female with history of left upper lobe lung cancer with known brain metastases diagnosed in 2016. Restaging examination. EXAM: CT CHEST WITH CONTRAST TECHNIQUE: Multidetector CT imaging of the chest was performed during intravenous contrast administration. CONTRAST:  <See Chart> ISOVUE-300 IOPAMIDOL (ISOVUE-300) INJECTION 61% COMPARISON:  Multiple priors, most recently 11/17/2016. FINDINGS: Cardiovascular: Heart size is normal. Small amount of pericardial fluid and/or thickening adjacent to the left ventricle,  similar to prior examination, unlikely to be of any hemodynamic significance at this time. No pericardial calcifications. There is aortic atherosclerosis, as well as atherosclerosis of the great vessels of the mediastinum and the coronary arteries, including calcified atherosclerotic plaque in the left anterior descending and right coronary arteries. Mediastinum/Nodes: No pathologically enlarged mediastinal or hilar lymph nodes. Esophagus is unremarkable in appearance. No axillary lymphadenopathy. Lungs/Pleura: Chronic mass-like areas of architectural distortion are again noted throughout the left mid to lower lung, similar to the prior study, most compatible with areas of chronic postradiation mass-like fibrosis. Peripheral 11 x 7 mm ground-glass attenuation nodule in the left upper lobe is new compared to the prior study (axial image 34 of series 7). Previously noted right upper lobe pulmonary nodule appears slightly bulkier than the prior examination measuring 8 x 7 mm on today's study (only 8 x 5 mm when measured in a similar fashion on the prior examination). 4 mm ground-glass attenuation nodule in the right middle lobe (image 75 of series 7) is new compared to the prior study. No confluent consolidative airspace disease. Small chronic left-sided pleural the fusion and pleural enhancement, similar to the  prior study. Mild diffuse bronchial wall thickening with mild centrilobular and paraseptal emphysema. Upper Abdomen: Aortic atherosclerosis. Musculoskeletal: There are no aggressive appearing lytic or blastic lesions noted in the visualized portions of the skeleton. IMPRESSION: 1. Stable postradiation changes in the left hemithorax, similar to prior studies. 2. Very slight enlargement of previously noted right upper lobe nodule which currently measures 8 x 7 mm. New ground-glass attenuation nodules also noted in the right middle lobe and left upper lobe, as detailed above. Continued attention on followup  studies is recommended. 3. Small amount of pericardial fluid and/or thickening, similar to the prior study, and unlikely to be of any hemodynamic significance at this time. 4. Aortic atherosclerosis, in addition to 2 vessel coronary artery disease. Assessment for potential risk factor modification, dietary therapy or pharmacologic therapy may be warranted, if clinically indicated. 5. Mild diffuse bronchial wall thickening with mild centrilobular and paraseptal emphysema ; imaging findings suggestive of underlying COPD. Electronically Signed   By: Vinnie Langton M.D.   On: 02/23/2017 09:41   ASSESSMENT AND PLAN:  This is a very pleasant 76 years old white female with metastatic non-small cell lung cancer that was initially diagnosed as unresectable a stage IIb. She will status course of concurrent chemoradiation followed by consolidation chemotherapy in addition to stereotactic radiotherapy to the metastatic brain lesion followed by surgical resection. The patient is currently on observation. Her recent CT scan showed no significant disease progression except for a slight increase in the right upper lobe pulmonary nodule. I personally and independently reviewed the scan images and discuss the results with the patient and her son. I recommended for her to continue on observation for now with repeat CT scan of the chest in 3 months for reevaluation of her disease. She was advised to call immediately if she has any concerning symptoms in the interval. The patient voices understanding of current disease status and treatment options and is in agreement with the current care plan. All questions were answered. The patient knows to call the clinic with any problems, questions or concerns. We can certainly see the patient much sooner if necessary. I spent 10 minutes counseling the patient face to face. The total time spent in the appointment was 15 minutes.  Disclaimer: This note was dictated with voice  recognition software. Similar sounding words can inadvertently be transcribed and may not be corrected upon review.   Eilleen Kempf., MD 02/24/17

## 2017-02-24 NOTE — Telephone Encounter (Signed)
Appointments scheduled per 02/24/17 los. Patient was given a copy of the AVS report and appointment schedule, per 02/24/17 los.

## 2017-03-22 ENCOUNTER — Other Ambulatory Visit: Payer: Self-pay | Admitting: Radiation Therapy

## 2017-03-22 DIAGNOSIS — C7949 Secondary malignant neoplasm of other parts of nervous system: Principal | ICD-10-CM

## 2017-03-22 DIAGNOSIS — C7931 Secondary malignant neoplasm of brain: Secondary | ICD-10-CM

## 2017-04-06 NOTE — Progress Notes (Signed)
Chelsea Cruz. [place] 76 y.o. woman with a solitary 3.4 cm left frontal brain metastasis from squamous cell carcinoma of the left upper lung radiation completed, review 04-12-17 MRI brain w wo contrast, FU.  PAIN: She is currently not having pain. NEURO:Alert and oriented x 3 with fluent speech,able to complete sentences without difficulty with word finding or organization of sentences. Denies any visual disturbance,blurred vision,double vision,blind spots or peripheral vision changes, wears reading glasses.  Denies reports any nausea or vomiting,ataxia,ring of ears,dizziness or headache. Fine motor movement-picking up objects with fingers,holding objects, writing, weakness of lower extremities:None Aphasia/Slurred speech:None SKIN: Warm and dry Imaging:04-12-17 MRI brain w wo contrast Lab:02-24-17 CBC w diff, Cmet 02-24-17 Saw Dr. Julien Nordmann recommended for her to continue on observation for now with repeat CT scan of the chest in 3 months for reevaluation of her disease.  Wt Readings from Last 3 Encounters:  04/14/17 134 lb 3.2 oz (60.9 kg)  02/24/17 134 lb 9.6 oz (61.1 kg)  11/24/16 135 lb 6.4 oz (61.4 kg)  BP (!) 145/59   Pulse 91   Temp 98.2 F (36.8 C) (Oral)   Resp 18   Ht 5\' 1"  (1.549 m)   Wt 134 lb 3.2 oz (60.9 kg)   SpO2 99%   BMI 25.36 kg/m

## 2017-04-12 ENCOUNTER — Ambulatory Visit
Admission: RE | Admit: 2017-04-12 | Discharge: 2017-04-12 | Disposition: A | Discharge status: Home / Self Care | Payer: Medicare Other | Source: Ambulatory Visit | Attending: Radiation Oncology | Admitting: Radiation Oncology

## 2017-04-12 DIAGNOSIS — C7931 Secondary malignant neoplasm of brain: Secondary | ICD-10-CM

## 2017-04-12 DIAGNOSIS — C7949 Secondary malignant neoplasm of other parts of nervous system: Principal | ICD-10-CM

## 2017-04-12 MED ORDER — GADOBENATE DIMEGLUMINE 529 MG/ML IV SOLN
15.0000 mL | Freq: Once | INTRAVENOUS | Status: AC | PRN
  Administered 2017-04-12: 15 mL via INTRAVENOUS

## 2017-04-14 ENCOUNTER — Ambulatory Visit
Admission: RE | Admit: 2017-04-14 | Discharge: 2017-04-14 | Disposition: A | Discharge status: Home / Self Care | Payer: Medicare Other | Source: Ambulatory Visit | Attending: Urology | Admitting: Urology

## 2017-04-14 ENCOUNTER — Encounter: Payer: Self-pay | Admitting: Urology

## 2017-04-14 VITALS — BP 145/59 | HR 91 | Temp 98.2°F | Resp 18 | Ht 61.0 in | Wt 134.2 lb

## 2017-04-14 DIAGNOSIS — R7301 Impaired fasting glucose: Secondary | ICD-10-CM | POA: Diagnosis not present

## 2017-04-14 DIAGNOSIS — C7931 Secondary malignant neoplasm of brain: Secondary | ICD-10-CM | POA: Diagnosis present

## 2017-04-14 DIAGNOSIS — K219 Gastro-esophageal reflux disease without esophagitis: Secondary | ICD-10-CM | POA: Insufficient documentation

## 2017-04-14 DIAGNOSIS — C3432 Malignant neoplasm of lower lobe, left bronchus or lung: Secondary | ICD-10-CM | POA: Diagnosis present

## 2017-04-14 DIAGNOSIS — Z720 Tobacco use: Secondary | ICD-10-CM | POA: Diagnosis not present

## 2017-04-14 DIAGNOSIS — J449 Chronic obstructive pulmonary disease, unspecified: Secondary | ICD-10-CM | POA: Insufficient documentation

## 2017-04-14 DIAGNOSIS — E785 Hyperlipidemia, unspecified: Secondary | ICD-10-CM | POA: Insufficient documentation

## 2017-04-14 DIAGNOSIS — M8580 Other specified disorders of bone density and structure, unspecified site: Secondary | ICD-10-CM | POA: Insufficient documentation

## 2017-04-14 DIAGNOSIS — I10 Essential (primary) hypertension: Secondary | ICD-10-CM | POA: Insufficient documentation

## 2017-04-15 NOTE — Progress Notes (Signed)
Radiation Oncology         [phone] ________________________________  Name: Chelsea Cruz  MRN: [id number]  Date: 04/14/2017  DOB: Sep 04, 1941    Follow-Up Visit Note  CC: Chelsea Caraway, MD  Chelsea Bears, MD  Diagnosis:   76 y.o. 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 Primary cancer of left lower lobe of lung (HCC) C34.32   2. Metastasis to brain [facility]) C79.31      Interval Since Last Radiation:  2 years and 5 months  11/06/14: Pre-op SRS to a left frontal 3.4 cm target to 15 Gy in 1 fraction   04/09/2013 and 05/24/2013: Conventional radiotherapy to the left lung with 66 Gy in 33 fractions   Narrative:  The patient comes today for follow-up of her most recent MRI of the brain. MRI of the brain on 04/12/17 demonstrated an unchanged appearance of left frontal lobe resection site without recurrent disease and no new parenchymal or calvarial lesions.. CT of the chest on 02/22/17 showed stable post therapy change in the left hemithorax and no significant disease progression with only very slight enlargement of a previously noted right upper lobe nodule which currently measures 8 x 7 mm. There were also new ground-glass attenuation nodules also noted in the right middle lobe and left upper lobe with continued attention on follow up studies recommended.  She remains under the care of Chelsea Cruz for disease surveillance and is currently undergoing observation only.    On review of system, the patient reports an occasional headache that doesn't require OTC medication to resolve. She denies chest pain, SOB, nausea, vomiting, auditory changes or diplopia. She reports occasional dizziness which is unchanged recently. She has occasional intermittent tinnitus and mild fatigue which are chronic and unchanged recently. She feels like her short term memory has slightly improved but difficulty finding words has  persisted but not worsened. A complete review of systems is obtained and is otherwise negative.  Past Medical History:  Past Medical History:  Diagnosis Date  . Brain cancer ([place])    squamous cell carcinoma of lung with a solitary brain met  . COPD (chronic obstructive pulmonary disease) ([place])   . Depression   . DNR no code (do not resuscitate) 07/23/2015  . GERD (gastroesophageal reflux disease)   . Hypercholesteremia   . Hypertension   . Impaired fasting glucose   . Lung cancer ([place]) dx'd 02/2013  . Nicotine addiction   . Nodule of left lung   . Osteomalacia   . Osteopenia     Past Surgical History: Past Surgical History:  Procedure Laterality Date  . APPENDECTOMY    . BILATERAL CARPAL TUNNEL RELEASE    . BRAIN SURGERY     tumor removal 11/08/2014  . CATARACT EXTRACTION W/ INTRAOCULAR LENS  IMPLANT, BILATERAL    . CHOLECYSTECTOMY    . CRANIOTOMY N/A 11/08/2014   Procedure: CRANIOTOMY TUMOR EXCISION;  Surgeon: Chelsea Levine, MD;  Location: [place] [facility];  Service: Neurosurgery;  Laterality: N/A;  CRANIOTOMY TUMOR EXCISION  . RIGHT OOPHORECTOMY     1969    Social History:  Social History   Social History  . Marital status: Widowed    Spouse name: N/A  . Number of children: N/A  . Years of education: N/A   Occupational History  . retired    Social History Main Topics  . Smoking status: Former  Smoker    Packs/day: 1.00    Years: 45.00    Types: Cigarettes    Quit date: 03/06/2013  . Smokeless tobacco: Never Used  . Alcohol use No  . Drug use: No  . Sexual activity: No   Other Topics Concern  . Not on file   Social History Narrative  . No narrative on file    Family History: Family History  Problem Relation Age of Onset  . Heart disease Mother   . Coronary artery disease Brother   . Alzheimer's disease Brother   . Heart disease Brother   . Hypertension Brother        #2  . Stroke Brother        #2  . Brain cancer Brother        #3     ALLERGIES:   is allergic to penicillins.  Meds: Current Outpatient Prescriptions  Medication Sig Dispense Refill  . acetaminophen (TYLENOL) 325 MG tablet Take 650 mg by mouth every 6 (six) hours as needed (for pain.).    Marland Kitchen Biotin 10 MG CAPS Take 1 each by mouth daily.    . cholecalciferol (VITAMIN D) 400 UNITS TABS tablet Take 2,000 Units by mouth daily.     Chelsea Cruz 20 MG TABS tablet   1   No current facility-administered medications for this encounter.     Physical Findings:  height is 5\' 1"  (1.549 m) and weight is 134 lb 3.2 oz (60.9 kg). Her oral temperature is 98.2 F (36.8 C). Her blood pressure is 145/59 (abnormal) and her pulse is 91. Her respiration is 18 and oxygen saturation is 99%.   Pain scale 0/10 In general this is a well appearing caucasian female in no acute distress. She's alert and oriented x4 and appropriate throughout the examination. Cardiopulmonary assessment is negative for acute distress and she exhibits normal effort.  HEENT reveals that the patient is normocephalic, atraumatic. EOMs are intact. PERRLA. Skin is intact without any evidence of gross lesions. She appears to be neurologically intact grossly without focal abnormalities.    Lab Findings: Lab Results  Component Value Date   WBC 7.6 02/22/2017   WBC 4.8 12/29/2014   HGB 12.9 02/22/2017   HCT 41.7 02/22/2017   PLT 167 02/22/2017    Lab Results  Component Value Date   NA 144 02/22/2017   K 4.5 02/22/2017   CHLORIDE 105 02/22/2017   CO2 29 02/22/2017   GLUCOSE 88 02/22/2017   GLUCOSE 102 (H) 04/09/2013   BUN 11.3 02/22/2017   CREATININE 0.9 02/22/2017   BILITOT 1.05 02/22/2017   ALKPHOS 84 02/22/2017   AST 20 02/22/2017   ALT 21 02/22/2017   PROT 7.6 02/22/2017   ALBUMIN 4.2 02/22/2017   CALCIUM 10.3 02/22/2017   ANIONGAP 10 02/22/2017   ANIONGAP 6 12/28/2014    Radiographic Findings: Mr 02/27/2015 Chelsea Cruz Contrast  Result Date: 04/12/2017 CLINICAL DATA:  Stereotactic radiosurgery 29 month follow-up  following removal of solitary squamous cell carcinoma brain metastasis. Creatinine was obtained on site at [facility] at [address] Results: Creatinine 0.8 mg/dL. EXAM: MRI HEAD WITHOUT AND WITH CONTRAST TECHNIQUE: Multiplanar, multiecho pulse sequences of the brain and surrounding structures were obtained without and with intravenous contrast. CONTRAST:  94mL MULTIHANCE GADOBENATE DIMEGLUMINE 529 MG/ML IV SOLN COMPARISON:  Brain MRI 09/30/2016, 06/15/2016, 08/28/2015, 02/14/2015 FINDINGS: Brain: The midline structures are normal. There is no focal diffusion restriction to indicate acute infarct. There are bilateral lentiform nucleus  is enlarged perivascular spaces. The appearance of the left frontal lobe resection cavity is unchanged, as is the surrounding hyperintense T2 weighted signal. A focus of contrast enhancement at the superior posterior aspect of the resection cavity (series 10, image 113) is unchanged compared to studies as far back as 02/14/2015. Intrinsically T1 hyperintense material within the resection bed is unchanged. No new nodular contrast enhancement. Mild contrast enhancement of the overlying dura is unchanged. No intraparenchymal hematoma or chronic microhemorrhage. No new contrast-enhancing lesions are identified. Vascular: Major intracranial arterial and venous sinus flow voids are preserved. Skull and upper cervical spine: Remote left pterional craniotomy. No discrete calvarial lesions. Sinuses/Orbits: No fluid levels or advanced mucosal thickening. No mastoid effusion. Normal orbits. IMPRESSION: 1. Unchanged appearance of left frontal lobe resection site without recurrent disease. 2. No new parenchymal or calvarial lesions. Electronically Signed   By: Ulyses Jarred M.D.   On: 04/12/2017 14:06    Impression:  1.  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 The patient continues under the care of Dr.  Julien Cruz for disease surveillance and has a scheduled follow up appointment on 05/26/17 to review repeat CT imaging. Regarding her brain disease, radiographically and clinically, she is doing well and is with stable findings on recent MRI. We will plan a follow up visit in 4 months following repeat MRI brain and presentation at brain conference.  She will keep [place] informed of any questions or concerns that arise prior to her next visit.    Nicholos Johns, PA-C

## 2017-05-24 ENCOUNTER — Other Ambulatory Visit (HOSPITAL_BASED_OUTPATIENT_CLINIC_OR_DEPARTMENT_OTHER): Payer: Medicare Other

## 2017-05-24 ENCOUNTER — Encounter (HOSPITAL_COMMUNITY): Payer: Self-pay

## 2017-05-24 ENCOUNTER — Ambulatory Visit (HOSPITAL_COMMUNITY)
Admission: RE | Admit: 2017-05-24 | Discharge: 2017-05-24 | Disposition: A | Discharge status: Home / Self Care | Payer: Medicare Other | Source: Ambulatory Visit | Attending: Internal Medicine | Admitting: Internal Medicine

## 2017-05-24 DIAGNOSIS — I700 Atherosclerosis of aorta: Secondary | ICD-10-CM | POA: Insufficient documentation

## 2017-05-24 DIAGNOSIS — C7931 Secondary malignant neoplasm of brain: Secondary | ICD-10-CM | POA: Insufficient documentation

## 2017-05-24 DIAGNOSIS — R918 Other nonspecific abnormal finding of lung field: Secondary | ICD-10-CM | POA: Insufficient documentation

## 2017-05-24 DIAGNOSIS — J432 Centrilobular emphysema: Secondary | ICD-10-CM | POA: Diagnosis not present

## 2017-05-24 DIAGNOSIS — C3432 Malignant neoplasm of lower lobe, left bronchus or lung: Secondary | ICD-10-CM

## 2017-05-24 LAB — CBC WITH DIFFERENTIAL/PLATELET
BASO%: 0.5 % (ref 0.0–2.0)
BASOS ABS: 0 10*3/uL (ref 0.0–0.1)
EOS%: 3.7 % (ref 0.0–7.0)
Eosinophils Absolute: 0.3 10*3/uL (ref 0.0–0.5)
HCT: 41.6 % (ref 34.8–46.6)
HEMOGLOBIN: 13 g/dL (ref 11.6–15.9)
LYMPH%: 31 % (ref 14.0–49.7)
MCH: 28.8 pg (ref 25.1–34.0)
MCHC: 31.3 g/dL — ABNORMAL LOW (ref 31.5–36.0)
MCV: 92 fL (ref 79.5–101.0)
MONO#: 0.6 10*3/uL (ref 0.1–0.9)
MONO%: 7.4 % (ref 0.0–14.0)
NEUT#: 4.4 10*3/uL (ref 1.5–6.5)
NEUT%: 57.4 % (ref 38.4–76.8)
Platelets: 199 10*3/uL (ref 145–400)
RBC: 4.52 10*6/uL (ref 3.70–5.45)
RDW: 14.1 % (ref 11.2–14.5)
WBC: 7.7 10*3/uL (ref 3.9–10.3)
lymph#: 2.4 10*3/uL (ref 0.9–3.3)

## 2017-05-24 LAB — COMPREHENSIVE METABOLIC PANEL
ALBUMIN: 4 g/dL (ref 3.5–5.0)
ALT: 18 U/L (ref 0–55)
AST: 18 U/L (ref 5–34)
Alkaline Phosphatase: 76 U/L (ref 40–150)
Anion Gap: 9 mEq/L (ref 3–11)
BUN: 15.6 mg/dL (ref 7.0–26.0)
CHLORIDE: 103 meq/L (ref 98–109)
CO2: 28 meq/L (ref 22–29)
Calcium: 10.5 mg/dL — ABNORMAL HIGH (ref 8.4–10.4)
Creatinine: 1.1 mg/dL (ref 0.6–1.1)
EGFR: 48 mL/min/{1.73_m2} — ABNORMAL LOW (ref >90)
GLUCOSE: 116 mg/dL (ref 70–140)
POTASSIUM: 4.9 meq/L (ref 3.5–5.1)
SODIUM: 141 meq/L (ref 136–145)
Total Bilirubin: 1.2 mg/dL (ref 0.20–1.20)
Total Protein: 7.2 g/dL (ref 6.4–8.3)

## 2017-05-24 MED ORDER — IOPAMIDOL (ISOVUE-300) INJECTION 61%
INTRAVENOUS | Status: AC
  Filled 2017-05-24: qty 75

## 2017-05-24 MED ORDER — IOPAMIDOL (ISOVUE-300) INJECTION 61%
75.0000 mL | Freq: Once | INTRAVENOUS | Status: AC | PRN
  Administered 2017-05-24: 75 mL via INTRAVENOUS

## 2017-05-26 ENCOUNTER — Ambulatory Visit (HOSPITAL_BASED_OUTPATIENT_CLINIC_OR_DEPARTMENT_OTHER): Payer: Medicare Other | Admitting: Internal Medicine

## 2017-05-26 ENCOUNTER — Telehealth: Payer: Self-pay | Admitting: Internal Medicine

## 2017-05-26 ENCOUNTER — Encounter: Payer: Self-pay | Admitting: Internal Medicine

## 2017-05-26 VITALS — BP 146/57 | HR 107 | Temp 98.2°F | Resp 17 | Ht 61.0 in | Wt 136.7 lb

## 2017-05-26 DIAGNOSIS — Z85118 Personal history of other malignant neoplasm of bronchus and lung: Secondary | ICD-10-CM

## 2017-05-26 DIAGNOSIS — C7931 Secondary malignant neoplasm of brain: Secondary | ICD-10-CM

## 2017-05-26 DIAGNOSIS — Z7901 Long term (current) use of anticoagulants: Secondary | ICD-10-CM | POA: Diagnosis not present

## 2017-05-26 DIAGNOSIS — C3432 Malignant neoplasm of lower lobe, left bronchus or lung: Secondary | ICD-10-CM

## 2017-05-26 DIAGNOSIS — Z86718 Personal history of other venous thrombosis and embolism: Secondary | ICD-10-CM | POA: Diagnosis not present

## 2017-05-26 NOTE — Telephone Encounter (Signed)
Scheduled appt per 8/9 los - Gave patient AVS and calender per los.  

## 2017-05-26 NOTE — Progress Notes (Signed)
[facility] [phone]   [phone]  OFFICE VISIT PROGRESS NOTE  Cari Caraway, MD [place] [address]  DIAGNOSIS: Metastatic non-small cell lung cancer initially diagnosed as Unresectable Stage IIB (T3, N0, M0) non-small cell lung cancer, poorly differentiated squamous cell carcinoma diagnosed in June of 2014.  The patient was diagnosed with metastatic lesion to the brain in January 2016.  PRIOR THERAPY:  1) Concurrent chemoradiation with weekly carboplatin for AUC of 2 and paclitaxel 45 mg/M2. Status post 5 cycles. First cycle was given on 04/09/2013. Last dose was given on 05/21/2013 with partial response.  2) Consolidation chemotherapy with carboplatin for AUC of 4 and paclitaxel 150 mg/M2 every 3 weeks with Neulasta support. First dose given on 07/31/2013. Status post 3 cycles with stable disease. 3) status post a stereotactic radiotherapy under the care of Dr. Tammi Klippel on 11/06/2014. 4) status post craniotomy with tumor resection under the care of Dr. Vertell Limber on 11/08/2014.  CURRENT THERAPY:  1) Observation. 2) Xarelto 20 mg by mouth daily for history of pulmonary embolism.  INTERVAL HISTORY: Chelsea Cruz 76 y.o. female returns to the clinic today for follow-up visit accompanied by her son. The patient is feeling fine today with no specific complaints except for arthritis in the right shoulder. She denied having any chest pain but has shortness breath with exertion with no cough or hemoptysis. She denied having any recent weight loss or night sweats. She has no nausea, vomiting, diarrhea or constipation. The patient had repeat CT scan of the chest performed recently and she is here today for evaluation and discussion of her scan results.   MEDICAL HISTORY: Past Medical History:  Diagnosis Date  . Brain cancer ([place])    squamous cell carcinoma of lung with a solitary brain met  . COPD (chronic obstructive pulmonary  disease) ([place])   . Depression   . DNR no code (do not resuscitate) 07/23/2015  . GERD (gastroesophageal reflux disease)   . Hypercholesteremia   . Hypertension   . Impaired fasting glucose   . Lung cancer ([place]) dx'd 02/2013  . Nicotine addiction   . Nodule of left lung   . Osteomalacia   . Osteopenia     ALLERGIES:  is allergic to penicillins.  MEDICATIONS:  Current Outpatient Prescriptions  Medication Sig Dispense Refill  . acetaminophen (TYLENOL) 325 MG tablet Take 650 mg by mouth every 6 (six) hours as needed (for pain.).    Marland Kitchen Biotin 10 MG CAPS Take 1 each by mouth daily.    . cholecalciferol (VITAMIN D) 400 UNITS TABS tablet Take 2,000 Units by mouth daily.     Alveda Reasons 20 MG TABS tablet   1   No current facility-administered medications for this visit.     SURGICAL HISTORY:  Past Surgical History:  Procedure Laterality Date  . APPENDECTOMY    . BILATERAL CARPAL TUNNEL RELEASE    . BRAIN SURGERY     tumor removal 11/08/2014  . CATARACT EXTRACTION W/ INTRAOCULAR LENS  IMPLANT, BILATERAL    . CHOLECYSTECTOMY    . CRANIOTOMY N/A 11/08/2014   Procedure: CRANIOTOMY TUMOR EXCISION;  Surgeon: Erline Levine, MD;  Location: [place] [facility];  Service: Neurosurgery;  Laterality: N/A;  CRANIOTOMY TUMOR EXCISION  . RIGHT OOPHORECTOMY     1969    REVIEW OF SYSTEMS:  A comprehensive review of systems was negative except for: Respiratory: positive for dyspnea on exertion Musculoskeletal: positive for arthralgias  PHYSICAL EXAMINATION: General appearance: alert, cooperative and no distress Head: Normocephalic, without obvious abnormality, atraumatic Neck: no adenopathy, no JVD, supple, symmetrical, trachea midline and thyroid not enlarged, symmetric, no tenderness/mass/nodules Lymph nodes: Cervical, supraclavicular, and axillary nodes normal. Resp: clear to auscultation bilaterally Back: symmetric, no curvature. ROM normal. No CVA tenderness. Cardio: regular rate and rhythm, S1, S2  normal, no murmur, click, rub or gallop GI: soft, non-tender; bowel sounds normal; no masses,  no organomegaly Extremities: extremities normal, atraumatic, no cyanosis or edema  ECOG PERFORMANCE STATUS: 0 - Asymptomatic  Blood pressure (!) 146/57, pulse (!) 107, temperature 98.2 F (36.8 C), temperature source Oral, resp. rate 17, height '5\' 1"'$  (1.549 m), weight 136 lb 11.2 oz (62 kg), SpO2 96 %.  LABORATORY DATA: Lab Results  Component Value Date   WBC 7.7 05/24/2017   HGB 13.0 05/24/2017   HCT 41.6 05/24/2017   MCV 92.0 05/24/2017   PLT 199 05/24/2017      Chemistry      Component Value Date/Time   NA 141 05/24/2017 0855   K 4.9 05/24/2017 0855   CL 99 12/28/2014 0602   CL 98 04/09/2013 1114   CO2 28 05/24/2017 0855   BUN 15.6 05/24/2017 0855   CREATININE 1.1 05/24/2017 0855      Component Value Date/Time   CALCIUM 10.5 (H) 05/24/2017 0855   ALKPHOS 76 05/24/2017 0855   AST 18 05/24/2017 0855   ALT 18 05/24/2017 0855   BILITOT 1.20 05/24/2017 0855       RADIOGRAPHIC STUDIES: Ct Chest W Contrast  Result Date: 05/24/2017 CLINICAL DATA:  Lung cancer with brain mets EXAM: CT CHEST WITH CONTRAST TECHNIQUE: Multidetector CT imaging of the chest was performed during intravenous contrast administration. CONTRAST:  31m ISOVUE-300 IOPAMIDOL (ISOVUE-300) INJECTION 61% COMPARISON:  02/22/2017 FINDINGS: Cardiovascular: Heart size upper normal. Trace pericardial effusion similar to prior. Coronary artery calcification is noted. Atherosclerotic calcification is noted in the wall of the thoracic aorta. Mediastinum/Nodes: No mediastinal lymphadenopathy. There is no hilar lymphadenopathy. The esophagus has normal imaging features. There is no axillary lymphadenopathy. Lungs/Pleura: Volume loss left hemithorax similar to prior. Centrilobular and paraseptal emphysema noted bilaterally. The left parahilar consolidative opacity is unchanged and remains compatible with post radiation fibrosis.  Left pleural thickening is stable. 8 x 7 mm right apical nodule measured on the prior study has decreased in the interval, measuring 5 x 6 mm today. No new or progressive interval findings in the chest. Upper Abdomen: Stable appearance of bilateral adrenal thickening. Musculoskeletal: Bone windows reveal no worrisome lytic or sclerotic osseous lesions. IMPRESSION: 1. Previously noted right upper lobe pulmonary nodule has decreased in size in the interval. 2. Ground-glass nodules identified previously in the right middle and left upper lobes have resolved in the interval. 3. Stable volume loss left hemithorax and associated left parahilar postradiation fibrosis. 4.  Emphysema. (ICD10-J43.9) 5.  Aortic Atherosclerois (ICD10-170.0) Electronically Signed   By: EMisty StanleyM.D.   On: 05/24/2017 14:12   ASSESSMENT AND PLAN:  This is a very pleasant 76years old white female with metastatic non-small cell lung cancer that was initially diagnosed as unresectable a stage IIb. She will status course of concurrent chemoradiation followed by consolidation chemotherapy in addition to stereotactic radiotherapy to the metastatic brain lesion followed by surgical resection. The patient is currently on observation. She had repeat CT scan of the chest performed recently that showed decrease in the size of the previously noted right upper lobe pulmonary nodule. Other  groundglass nodules identified on the previous scan had resolved in the interval. I discussed the scan results with the patient and her son today. I recommended for her to continue on observation with repeat CT scan of the chest in 6 months. For the history of metastatic brain lesions, the patient will continue her follow-up and repeat MRI of the brain under the care of Dr. Tammi Klippel. The patient voices understanding of current disease status and treatment options and is in agreement with the current care plan. All questions were answered. The patient knows to  call the clinic with any problems, questions or concerns. We can certainly see the patient much sooner if necessary. I spent 10 minutes counseling the patient face to face. The total time spent in the appointment was 15 minutes.  Disclaimer: This note was dictated with voice recognition software. Similar sounding words can inadvertently be transcribed and may not be corrected upon review.   Eilleen Kempf., MD 05/26/17

## 2017-07-25 ENCOUNTER — Other Ambulatory Visit: Payer: Self-pay | Admitting: Radiation Therapy

## 2017-07-25 DIAGNOSIS — C7931 Secondary malignant neoplasm of brain: Secondary | ICD-10-CM

## 2017-07-25 DIAGNOSIS — C7949 Secondary malignant neoplasm of other parts of nervous system: Principal | ICD-10-CM

## 2017-08-11 NOTE — Progress Notes (Signed)
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  PAIN: Sheis currently not having pain. NEURO:Alert and oriented x 3 with fluent speech,able to complete sentences without difficulty with word finding or organization of sentences.  Feels she is forgetful at times. Denies any visual disturbance,blurred vision,double vision,blind spots or peripheral vision changes, wears reading glasses.  Denies  any nausea or vomiting,ataxia,dizziness or headache.  Reports ring in the left ear that goes away. Fine motor movement-picking up objects with fingers,holding objects, writing, weakness of lower extremities:None Aphasia/Slurred speech:None SKIN: Warm and dry Imaging:08-16-17 MRI brain w wo contrast Lab:N/A 05-26-17 Saw Dr. Julien Nordmann she is currently on observation.  I recommended for her to continue on observation with repeat CT scan of the chest in 6 months. Wt Readings from Last 3 Encounters:  08/17/17 135 lb (61.2 kg)  05/26/17 136 lb 11.2 oz (62 kg)  04/14/17 134 lb 3.2 oz (60.9 kg)  BP 129/64   Pulse 84   Temp 97.8 F (36.6 C) (Oral)   Resp 18   Ht 5\' 1"  (1.549 m)   Wt 135 lb (61.2 kg)   BMI 25.51 kg/m

## 2017-08-16 ENCOUNTER — Ambulatory Visit
Admission: RE | Admit: 2017-08-16 | Discharge: 2017-08-16 | Disposition: A | Discharge status: Home / Self Care | Payer: Medicare Other | Source: Ambulatory Visit | Attending: Radiation Oncology | Admitting: Radiation Oncology

## 2017-08-16 DIAGNOSIS — C7949 Secondary malignant neoplasm of other parts of nervous system: Principal | ICD-10-CM

## 2017-08-16 DIAGNOSIS — C7931 Secondary malignant neoplasm of brain: Secondary | ICD-10-CM

## 2017-08-16 MED ORDER — GADOBENATE DIMEGLUMINE 529 MG/ML IV SOLN
12.0000 mL | Freq: Once | INTRAVENOUS | Status: AC | PRN
  Administered 2017-08-16: 12 mL via INTRAVENOUS

## 2017-08-17 ENCOUNTER — Encounter: Payer: Self-pay | Admitting: Urology

## 2017-08-17 ENCOUNTER — Ambulatory Visit
Admission: RE | Admit: 2017-08-17 | Discharge: 2017-08-17 | Disposition: A | Discharge status: Home / Self Care | Payer: Medicare Other | Source: Ambulatory Visit | Attending: Urology | Admitting: Urology

## 2017-08-17 VITALS — BP 129/64 | HR 84 | Temp 97.8°F | Resp 18 | Ht 61.0 in | Wt 135.0 lb

## 2017-08-17 DIAGNOSIS — M8580 Other specified disorders of bone density and structure, unspecified site: Secondary | ICD-10-CM | POA: Insufficient documentation

## 2017-08-17 DIAGNOSIS — Z720 Tobacco use: Secondary | ICD-10-CM | POA: Insufficient documentation

## 2017-08-17 DIAGNOSIS — C3432 Malignant neoplasm of lower lobe, left bronchus or lung: Secondary | ICD-10-CM | POA: Diagnosis present

## 2017-08-17 DIAGNOSIS — C7931 Secondary malignant neoplasm of brain: Secondary | ICD-10-CM | POA: Diagnosis present

## 2017-08-17 DIAGNOSIS — K219 Gastro-esophageal reflux disease without esophagitis: Secondary | ICD-10-CM | POA: Insufficient documentation

## 2017-08-17 DIAGNOSIS — I10 Essential (primary) hypertension: Secondary | ICD-10-CM | POA: Diagnosis not present

## 2017-08-17 DIAGNOSIS — E785 Hyperlipidemia, unspecified: Secondary | ICD-10-CM | POA: Diagnosis not present

## 2017-08-17 DIAGNOSIS — R7301 Impaired fasting glucose: Secondary | ICD-10-CM | POA: Insufficient documentation

## 2017-08-17 DIAGNOSIS — J449 Chronic obstructive pulmonary disease, unspecified: Secondary | ICD-10-CM | POA: Diagnosis not present

## 2017-08-19 NOTE — Progress Notes (Signed)
Radiation Oncology         ([phone]) [phone] ________________________________  Name: Chelsea Cruz  MRN: [id number]  Date: 08/17/2017  DOB: Sep 17, 1941    Follow-Up Visit Note  CC: Cari Caraway, MD  Curt Bears, MD  Diagnosis:   76 y.o. 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 Primary cancer of left lower lobe of lung (HCC) C34.32   2. Solitary Left Frontal 3.4 cm Brain Metastasis C79.31      Interval Since Last Radiation:  2 years and 11 months  11/06/14: Pre-op SRS to a left frontal 3.4 cm target to 15 Gy in 1 fraction   04/09/2013 and 05/24/2013: Conventional radiotherapy to the left lung with 66 Gy in 33 fractions   Narrative:  The patient comes today for follow-up of her most recent MRI of the brain. MRI of the brain on 08/16/17 demonstrated a stable appearance of the LEFT frontal lobe post surgery and SRS. No concerning features for recurrence, new metastatic disease, or post treatment effect. CT of the chest on 05/24/17 showed the previously noted right upper lobe pulmonary nodule has decreased in size in the interval. The ground-glass nodules identified previously in the right middle and left upper lobes have resolved in the interval.  She remains under the care of Dr. Julien Nordmann for disease surveillance and is currently undergoing observation only.    On review of systems, the patient reports an occasional headache that doesn't require OTC medication to resolve. She denies chest pain, SOB, nausea, vomiting, auditory changes or diplopia. She has had a recent cough at night which she attributes to sinus drainage. The cough is nonproductive and she denies hemoptysis. She reports occasional dizziness which is unchanged recently. She has occasional intermittent tinnitus and mild fatigue which are chronic and unchanged recently. She feels like her short term memory has slightly improved and  difficulty finding words has persisted but not worsened. A complete review of systems is obtained and is otherwise negative.  Past Medical History:  Past Medical History:  Diagnosis Date  . Brain cancer ([place])    squamous cell carcinoma of lung with a solitary brain met  . COPD (chronic obstructive pulmonary disease) ([place])   . Depression   . DNR no code (do not resuscitate) 07/23/2015  . GERD (gastroesophageal reflux disease)   . Hypercholesteremia   . Hypertension   . Impaired fasting glucose   . Lung cancer ([place]) dx'd 02/2013  . Nicotine addiction   . Nodule of left lung   . Osteomalacia   . Osteopenia     Past Surgical History: Past Surgical History:  Procedure Laterality Date  . APPENDECTOMY    . BILATERAL CARPAL TUNNEL RELEASE    . BRAIN SURGERY     tumor removal 11/08/2014  . CATARACT EXTRACTION W/ INTRAOCULAR LENS  IMPLANT, BILATERAL    . CHOLECYSTECTOMY    . CRANIOTOMY N/A 11/08/2014   Procedure: CRANIOTOMY TUMOR EXCISION;  Surgeon: Erline Levine, MD;  Location: [facility];  Service: Neurosurgery;  Laterality: N/A;  CRANIOTOMY TUMOR EXCISION  . RIGHT OOPHORECTOMY     1969    Social History:  Social History   Social History  . Marital status: Widowed    Spouse name: N/A  . Number of children: N/A  . Years of education: N/A   Occupational History  . retired    Social History Main Topics  .  Smoking status: Former Smoker    Packs/day: 1.00    Years: 45.00    Types: Cigarettes    Quit date: 03/06/2013  . Smokeless tobacco: Never Used  . Alcohol use No  . Drug use: No  . Sexual activity: No   Other Topics Concern  . Not on file   Social History Narrative  . No narrative on file    Family History: Family History  Problem Relation Age of Onset  . Heart disease Mother   . Coronary artery disease Brother   . Alzheimer's disease Brother   . Heart disease Brother   . Hypertension Brother        #2  . Stroke Brother        #2  . Brain cancer Brother         #3     ALLERGIES:  is allergic to penicillins.  Meds: Current Outpatient Prescriptions  Medication Sig Dispense Refill  . acetaminophen (TYLENOL) 325 MG tablet Take 650 mg by mouth every 6 (six) hours as needed (for pain.).    Marland Kitchen cholecalciferol (VITAMIN D) 400 UNITS TABS tablet Take 2,000 Units by mouth daily.     Alveda Reasons 20 MG TABS tablet   1  . Biotin 10 MG CAPS Take 1 each by mouth daily.     No current facility-administered medications for this encounter.     Physical Findings:  height is _0  (1.549 m) and weight is 135 lb (61.2 kg). Her oral temperature is 97.8 F (36.6 C). Her blood pressure is 129/64 and her pulse is 84. Her respiration is 18.   Pain scale 0/10 In general this is a well appearing caucasian female in no acute distress. She's alert and oriented x4 and appropriate throughout the examination. Cardiopulmonary assessment is negative for acute distress and she exhibits normal effort.  HEENT reveals that the patient is normocephalic, atraumatic. EOMs are intact. PERRLA. Skin is intact without any evidence of gross lesions. She appears to be neurologically intact grossly without focal abnormalities.    Lab Findings: Lab Results  Component Value Date   WBC 7.7 05/24/2017   WBC 4.8 12/29/2014   HGB 13.0 05/24/2017   HCT 41.6 05/24/2017   PLT 199 05/24/2017    Lab Results  Component Value Date   NA 141 05/24/2017   K 4.9 05/24/2017   CHLORIDE 103 05/24/2017   CO2 28 05/24/2017   GLUCOSE 116 05/24/2017   GLUCOSE 102 (H) 04/09/2013   BUN 15.6 05/24/2017   CREATININE 1.1 05/24/2017   BILITOT 1.20 05/24/2017   ALKPHOS 76 05/24/2017   AST 18 05/24/2017   ALT 18 05/24/2017   PROT 7.2 05/24/2017   ALBUMIN 4.0 05/24/2017   CALCIUM 10.5 (H) 05/24/2017   ANIONGAP 9 05/24/2017   ANIONGAP 6 12/28/2014    Radiographic Findings: Mr [name] IR Contrast  Result Date: 08/16/2017 CLINICAL DATA:  Lung cancer, staging. SRS 33 month follow-up. [facility]  11/06/2014. Craniotomy 11/08/2014. EXAM: MRI HEAD WITHOUT AND WITH CONTRAST TECHNIQUE: Multiplanar, multiecho pulse sequences of the brain and surrounding structures were obtained without and with intravenous contrast. CONTRAST:  37m MULTIHANCE GADOBENATE DIMEGLUMINE 529 MG/ML IV SOLN COMPARISON:  Multiple priors, most recent 04/12/2017. FINDINGS: Brain: No evidence acute stroke, acute hemorrhage, mass lesion, hydrocephalus, or extra-axial fluid. Patient has had resection of a LEFT frontal lobe metastasis, followed by stereotactic previous surgery, mild residual enhancement is unchanged in size and contour from priors. Overlying dural enhancement is an expected postoperative  finding. Regional gliosis, encephalomalacia, and chronic blood products remain stable. Generalized atrophy. Chronic microvascular ischemic change. Ex vacuo LEFT lateral ventricle enlargement. No new areas of intracranial metastatic disease. Vascular: Normal flow voids. Skull and upper cervical spine: Normal marrow signal. Sinuses/Orbits: Negative. Other: None. IMPRESSION: Stable appearance of the LEFT frontal lobe post surgery and SRS. No concerning features for recurrence, new metastatic disease, or post treatment effect. Electronically Signed   By: Staci Righter M.D.   On: 08/16/2017 14:54    Impression:  1.  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 The patient continues under the care of Dr. Julien Nordmann for disease surveillance and has a scheduled follow up appointment on 11/30/17 to review repeat CT imaging. Regarding her brain disease, radiographically and clinically, she is doing well and is with stable findings on recent MRI. We will plan a follow up visit in 4 months following repeat MRI brain and presentation at brain conference.  She will keep [place] informed of any questions or concerns that arise prior to her next visit.    Nicholos Johns, PA-C

## 2017-10-28 ENCOUNTER — Other Ambulatory Visit: Payer: Self-pay | Admitting: Radiation Therapy

## 2017-10-28 DIAGNOSIS — C7949 Secondary malignant neoplasm of other parts of nervous system: Principal | ICD-10-CM

## 2017-10-28 DIAGNOSIS — C7931 Secondary malignant neoplasm of brain: Secondary | ICD-10-CM

## 2017-11-28 ENCOUNTER — Ambulatory Visit (HOSPITAL_COMMUNITY): Payer: Medicare Other

## 2017-11-28 ENCOUNTER — Other Ambulatory Visit: Payer: Medicare Other

## 2017-11-28 ENCOUNTER — Encounter (HOSPITAL_COMMUNITY): Payer: Self-pay

## 2017-11-30 ENCOUNTER — Ambulatory Visit: Payer: Medicare Other | Admitting: Internal Medicine

## 2017-12-05 ENCOUNTER — Telehealth: Payer: Self-pay | Admitting: Radiation Therapy

## 2017-12-05 NOTE — Telephone Encounter (Signed)
    Ms. Dirks passed away at home on 2017/12/09. Per her daughter, she was battling a cold or flu, and refused to go to the doctor. Her daughter went by the house to check on her and found her.   Mont Dutton R.T.(R)(T) Special Procedures Navigator

## 2017-12-15 ENCOUNTER — Other Ambulatory Visit: Payer: Medicare Other

## 2017-12-16 DEATH — deceased

## 2017-12-19 ENCOUNTER — Ambulatory Visit: Payer: Self-pay | Admitting: Radiation Oncology
